# Patient Record
Sex: Male | Born: 1955 | Race: White | Hispanic: No | Marital: Married | Smoking: Never smoker
Health system: Southern US, Community
[De-identification: ages and names within clinical notes are randomized; demographics above are authoritative.]

## PROBLEM LIST (undated history)

## (undated) DIAGNOSIS — G20A1 Dementia in other diseases classified elsewhere, unspecified severity, without behavioral disturbance, psychotic disturbance, mood disturbance, and anxiety: Secondary | ICD-10-CM

## (undated) DIAGNOSIS — N401 Enlarged prostate with lower urinary tract symptoms: Secondary | ICD-10-CM

## (undated) DIAGNOSIS — Z978 Presence of other specified devices: Secondary | ICD-10-CM

## (undated) DIAGNOSIS — M359 Systemic involvement of connective tissue, unspecified: Secondary | ICD-10-CM

## (undated) DIAGNOSIS — Z960 Presence of urogenital implants: Secondary | ICD-10-CM

## (undated) DIAGNOSIS — R338 Other retention of urine: Secondary | ICD-10-CM

## (undated) DIAGNOSIS — G20 Parkinson's disease: Secondary | ICD-10-CM

## (undated) DIAGNOSIS — F0280 Dementia in other diseases classified elsewhere without behavioral disturbance: Secondary | ICD-10-CM

## (undated) DIAGNOSIS — N390 Urinary tract infection, site not specified: Secondary | ICD-10-CM

---

## 2000-04-18 ENCOUNTER — Encounter (INDEPENDENT_AMBULATORY_CARE_PROVIDER_SITE_OTHER): Payer: Self-pay | Admitting: Specialist

## 2000-04-18 ENCOUNTER — Other Ambulatory Visit: Admission: RE | Admit: 2000-04-18 | Discharge: 2000-04-18 | Payer: Self-pay | Admitting: Urology

## 2000-04-25 ENCOUNTER — Encounter: Payer: Self-pay | Admitting: Neurosurgery

## 2000-04-25 ENCOUNTER — Ambulatory Visit (HOSPITAL_COMMUNITY): Admission: RE | Admit: 2000-04-25 | Discharge: 2000-04-25 | Payer: Self-pay | Admitting: Neurosurgery

## 2000-05-09 ENCOUNTER — Encounter: Payer: Self-pay | Admitting: Neurosurgery

## 2000-05-09 ENCOUNTER — Ambulatory Visit (HOSPITAL_COMMUNITY): Admission: RE | Admit: 2000-05-09 | Discharge: 2000-05-09 | Payer: Self-pay | Admitting: Neurosurgery

## 2000-05-23 ENCOUNTER — Ambulatory Visit (HOSPITAL_COMMUNITY): Admission: RE | Admit: 2000-05-23 | Discharge: 2000-05-23 | Payer: Self-pay | Admitting: Neurosurgery

## 2000-05-23 ENCOUNTER — Encounter: Payer: Self-pay | Admitting: Neurosurgery

## 2007-04-19 ENCOUNTER — Emergency Department (HOSPITAL_COMMUNITY): Admission: EM | Admit: 2007-04-19 | Discharge: 2007-04-19 | Payer: Self-pay | Admitting: Emergency Medicine

## 2010-01-17 ENCOUNTER — Emergency Department (HOSPITAL_BASED_OUTPATIENT_CLINIC_OR_DEPARTMENT_OTHER): Admission: EM | Admit: 2010-01-17 | Discharge: 2010-01-17 | Payer: Self-pay | Admitting: Emergency Medicine

## 2011-09-18 ENCOUNTER — Encounter (HOSPITAL_BASED_OUTPATIENT_CLINIC_OR_DEPARTMENT_OTHER): Payer: Self-pay | Admitting: *Deleted

## 2011-09-18 ENCOUNTER — Emergency Department (INDEPENDENT_AMBULATORY_CARE_PROVIDER_SITE_OTHER): Payer: BC Managed Care – PPO

## 2011-09-18 ENCOUNTER — Emergency Department (HOSPITAL_BASED_OUTPATIENT_CLINIC_OR_DEPARTMENT_OTHER)
Admission: EM | Admit: 2011-09-18 | Discharge: 2011-09-18 | Disposition: A | Discharge status: Home / Self Care | Payer: BC Managed Care – PPO | Attending: Emergency Medicine | Admitting: Emergency Medicine

## 2011-09-18 DIAGNOSIS — N400 Enlarged prostate without lower urinary tract symptoms: Secondary | ICD-10-CM | POA: Insufficient documentation

## 2011-09-18 DIAGNOSIS — N323 Diverticulum of bladder: Secondary | ICD-10-CM

## 2011-09-18 DIAGNOSIS — R109 Unspecified abdominal pain: Secondary | ICD-10-CM

## 2011-09-18 LAB — URINALYSIS, ROUTINE W REFLEX MICROSCOPIC
Bilirubin Urine: NEGATIVE
Glucose, UA: NEGATIVE mg/dL
Hgb urine dipstick: NEGATIVE
Ketones, ur: NEGATIVE mg/dL
Leukocytes, UA: NEGATIVE
Nitrite: NEGATIVE
Protein, ur: NEGATIVE mg/dL
Specific Gravity, Urine: 1.014 (ref 1.005–1.030)
Urobilinogen, UA: 0.2 mg/dL (ref 0.0–1.0)
pH: 6 (ref 5.0–8.0)

## 2011-09-18 MED ORDER — HYDROCODONE-ACETAMINOPHEN 5-325 MG PO TABS: 1.0000 | ORAL_TABLET | ORAL | Status: AC | PRN

## 2011-09-18 NOTE — ED Provider Notes (Signed)
History     CSN: [id number]  Arrival date & time 09/18/11  1925   First MD Initiated Contact with Patient 09/18/11 2207      Chief Complaint  Patient presents with  . Flank Pain    (Consider location/radiation/quality/duration/timing/severity/associated sxs/prior treatment) Patient is a 56 y.o. male presenting with flank pain. The history is provided by the patient and medical records. No language interpreter was used.  Flank Pain This is a new problem. The current episode started 6 to 12 hours ago. Episode frequency: Intermittent episodes of left flank pain, starting this morning. The problem has not changed since onset.Associated symptoms comments: No vomiting or diarrhea.  No dysuria or hematuria.  No history of injury.. The symptoms are aggravated by nothing. The symptoms are relieved by nothing. He has tried nothing for the symptoms.    Past Medical History  Diagnosis Date  . Enlarged prostate     History reviewed. No pertinent past surgical history.  No family history on file.  History  Substance Use Topics  . Smoking status: Never Smoker   . Smokeless tobacco: Not on file  . Alcohol Use: Yes      Review of Systems  Constitutional: Negative.  Negative for fever and chills.  HENT: Negative.   Eyes: Negative.   Respiratory: Negative.   Cardiovascular: Negative.   Gastrointestinal: Negative for nausea, vomiting and diarrhea.  Genitourinary: Positive for flank pain. Negative for dysuria, frequency, hematuria and difficulty urinating.  Musculoskeletal: Negative.   Skin: Negative.   Neurological: Negative.   Psychiatric/Behavioral: Negative.     Allergies  Levaquin  Home Medications   Current Outpatient Rx  Name Route Sig Dispense Refill  . TAMSULOSIN HCL 0.4 MG PO CAPS Oral Take 0.4 mg by mouth daily.    Marland Kitchen HYDROCODONE-ACETAMINOPHEN 5-325 MG PO TABS Oral Take 1 tablet by mouth every 4 (four) hours as needed for pain. 20 tablet 0    BP 153/99  Pulse 74   Temp(Src) 98 F (36.7 C) (Oral)  Resp 18  SpO2 98%  Physical Exam  Constitutional: He is oriented to person, place, and time. He appears well-developed and well-nourished. No distress.  HENT:  Head: Normocephalic and atraumatic.  Right Ear: External ear normal.  Left Ear: External ear normal.  Mouth/Throat: Oropharynx is clear and moist.  Eyes: Conjunctivae and EOM are normal. Pupils are equal, round, and reactive to light.  Neck: Normal range of motion. Neck supple.  Cardiovascular: Normal rate, regular rhythm and normal heart sounds.   Pulmonary/Chest: Effort normal and breath sounds normal.  Abdominal: Soft. Bowel sounds are normal.  Musculoskeletal:       Pain is localized to the left CVA region.  There is no bony deformity or point tenderness.  Neurological: He is alert and oriented to person, place, and time.       No sensory or motor deficit.  Skin: Skin is warm and dry. No rash noted. No erythema.  Psychiatric: He has a normal mood and affect. His behavior is normal.    ED Course  Procedures (including critical care time)   Labs Reviewed  URINALYSIS, ROUTINE W REFLEX MICROSCOPIC   Ct Abdomen Pelvis Wo Contrast  09/18/2011  *RADIOLOGY REPORT*  Clinical Data: Left flank pain; history of renal stone.  CT ABDOMEN AND PELVIS WITHOUT CONTRAST  Technique:  Multidetector CT imaging of the abdomen and pelvis was performed following the standard protocol without intravenous contrast.  Comparison: None.  Findings: The visualized lung bases are clear.  The liver and spleen are unremarkable in appearance.  The gallbladder is within normal limits.  The pancreas and adrenal glands are unremarkable.  A nonspecific focal calcification is noted anterior to the right hepatic lobe; this could reflect a calcified epiploic appendage.  A 1.8 cm left parapelvic cyst is suspected.  There is no evidence of hydronephrosis.  No renal or ureteral stones are identified.  No significant perinephric  stranding is appreciated.  No free fluid is identified.  There is mild diffuse fecalization of the distal ileum, raising question for mild small bowel dysmotility.  The small bowel is otherwise unremarkable in appearance.  The stomach is within normal limits.  No acute vascular abnormalities are seen.  Minimal calcification is noted along the distal abdominal aorta and its branches.  The appendix is normal in caliber, without evidence for appendicitis.  The colon is grossly unremarkable in appearance.  The bladder is the bladder is significantly distended.  There is a prominent 4.5 cm diverticulum arising from the posterior aspect of the bladder, with significant wall thickening at the neck of the diverticulum; this is nonspecific and may reflect chronic inflammation, though malignancy cannot be entirely excluded.  The prostate is borderline normal in size; there is asymmetric prominence of the right seminal vesicle.  No definite pelvic sidewall lymphadenopathy is seen.  No inguinal lymphadenopathy is seen.  No acute osseous abnormalities are identified.  Multilevel disc space narrowing and vacuum phenomenon and along the lower thoracic and lumbar spine.  IMPRESSION:  1.  No evidence of hydronephrosis; no obstructing ureteral stones seen. 2.  Prominent 4.5 cm diverticulum arising from the posterior aspect of the bladder, with significant wall thickening at the neck of the diverticula.  This is nonspecific and may reflect chronic inflammation, though malignancy cannot be entirely excluded. Suggest further evaluation as deemed clinically appropriate. 3.  Asymmetric prominence of the right seminal vesicle; the prostate remains borderline normal in size. 4.  Mild diffuse fecalization of the distal ileum raises question for mild small bowel dysmotility. 5.  Likely 1.2 cm left renal parapelvic cyst incidentally noted.  Original Report Authenticated By: Tonia Ghent, M.D.     1. Left flank pain   2. Bladder  diverticulum    CT shows relatively large bladder diverticulum, prostate borderline enlarged.  No real explanation of his pain.  Rx hydrocodone-acetaminophen for pain, referral to his urologist, Barron Alvine M.D.    Carleene Cooper III, MD 09/19/11 [phone]

## 2011-09-18 NOTE — ED Notes (Signed)
Pt c/o left flank pain that began this morning. Pt denies dysuria.

## 2012-05-18 DIAGNOSIS — G20 Parkinson's disease: Secondary | ICD-10-CM | POA: Insufficient documentation

## 2012-11-03 ENCOUNTER — Other Ambulatory Visit: Payer: Self-pay | Admitting: Urology

## 2012-11-16 ENCOUNTER — Encounter (HOSPITAL_BASED_OUTPATIENT_CLINIC_OR_DEPARTMENT_OTHER): Payer: Self-pay | Admitting: *Deleted

## 2012-11-16 NOTE — Progress Notes (Addendum)
NPO AFTER MN. ARRIVES AT 0845. NEEDS HG. WILL  REVIEW CHART W/ MDA TOMORROW 11-17-2012, CONCERNING MAO INHIBITOR PT IS ON, AND CALL PT BACK IF NEEDS TO STOP PRIOR TO SURGERY.  REVIEWED CHART W/ DR DENNENY MDA,  ABOUT PT STOPPING MAO INHIBITOR, HE STATES PT DID NOT NEED TO STOP THIS MED.  SPOKE W/ PT AND TOLD HIM HE DID NOT NEED TO STOP HIS MED. AND POSS. OWER. REVIEWED [facility] GUIDELINES, PT WILL BRING MEDS.

## 2012-11-20 NOTE — H&P (Signed)
Patient presents today for elective TURP. He has history of a recent office visit as summarized below:    History of Present Illness   Adam Cordova returns today for a followup visit. He was last seen about a month ago. The note from that time will outline his history. He has been seen for some fairly longstanding benign prostatic hyperplasia and bladder neck obstructive symptoms complicated by a diagnosis of Parkinson's disease. The patient has also had a mildly elevated PSA in the past. At his last visit, he was noted to have a markedly elevated postvoid residual of about 700 mL. Foley catheter was inserted, which confirmed a very large postvoid residual. Renal function studies were normal. We thought it was very important for him to have urodynamic studies to try to determine how much of this might be related to some bladder dysfunction due to Parkinson's disease versus outlet obstruction . At the time of urodynamics, the patient's bladder was noted to be somewhat hyposensitive. He did have some very minimal instability, but not until over 450 cc was instilled in his bladder and then instability was relatively mild with very low amplitude contractions. The patient was able to generate a good, well-sustained detrusor contraction, but was unable to void. His bladder base was elevated. Urodynamic findings were certainly much more consistent with obstruction than they were detrusor dysfunction. The patient presents now for a discussion about where we go from here. He is interested in trying to get the Foley catheter out if at all possible, but again, we have instituted no treatment other than the Foley catheter for the past 3-4 weeks. He remains on Flomax.      Past Medical History Problems  1. History of  Hypercholesterolemia 272.0 2. History of  Parkinson's Disease 332.0  Current Meds 1. Cialis 5 MG Oral Tablet; Take 1 tablet daily; Therapy: 19Aug2013 to (Evaluate:17Mar2014)   Requested for: 19Aug2013;  Last Rx:19Aug2013 2. EpiPen 2-Pak 0.3 MG/0.3ML Injection Device; AS NEEDED; Therapy: 26Aug2010 to  (Evaluate:25Sep2010) 3. Pramipexole Dihydrochloride 0.5 MG Oral Tablet; Therapy: 12Apr2013 to 4. Selegiline HCl 5 MG Oral Tablet; Therapy: 22Oct2013 to  Allergies Medication  1. Levaquin TABS Non-Medication  2. Bee sting  Family History Problems  1. Family history of  Family Health Status Number Of Children 1 son; 1 daughter  Social History Problems  1. Alcohol Use 1-2 2. Caffeine Use 3-4 3. Family history of  Death In The Family Father 59, stroke 4. Family history of  Death In The Family Mother 58 5. Never A Smoker  Review of Systems Genitourinary, constitutional, skin, eye, otolaryngeal, hematologic/lymphatic, cardiovascular, pulmonary, endocrine, musculoskeletal, gastrointestinal, neurological and psychiatric system(s) were reviewed and pertinent findings if present are noted.  Genitourinary: urinary frequency and erectile dysfunction, but no hematuria.  Gastrointestinal: no abdominal pain.  Musculoskeletal: back pain.    Vitals Vital Signs [Data Includes: Last 1 Day]  04Mar2014 10:09AM  Blood Pressure: 147 / 94 Temperature: 98.1 F Heart Rate: 76  Physical Exam Well-developed well-nourished male in no acute distress Respiratory: Normal effort Cardiac: Regular rate and rhythm Abdomen: Soft and nontender no palpable masses Extremity: No edema tenderness Genitourinary: Examination of the penis demonstrates an indwelling catheter, but no discharge, no masses, no lesions and a normal meatus. The scrotum is without lesions. The right epididymis is palpably normal and non-tender. The left epididymis is palpably normal and non-tender. The right testis is non-tender and without masses. The left testis is non-tender and without masses.    Procedure  Procedure:  Cystoscopy  Chaperone Present: brandy.  Indication: Lower Urinary Tract Symptoms.  Informed Consent: Risks,  benefits, and potential adverse events were discussed and informed consent was obtained. Specific risks including, but not limited to bleeding, infection, pain, allergic reaction etc. were explained.  Prep: The patient was prepped with betadine.  Anesthesia:. Local anesthesia was administered intraurethrally with 2% lidocaine jelly.  Antibiotic prophylaxis: Trimethoprim/Sulfamethoxazole.  Procedure Note:  Urethral meatus:. No abnormalities.  Anterior urethra: No abnormalities.  Prostatic urethra:. Estimated length was 4.5 cm. There was visual obstruction of the prostatic urethra.  Bladder: Visulization was clear. Examination of the bladder demonstrated trabeculation and a diverticulum located near the trigone of the bladder measuring approximately 6-8 cm erythematous mucosa. The patient tolerated the procedure well.  Complications: None.    Assessment Assessed  1. 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 Benign Prostatic Hypertrophy With Urinary Obstruction 600.01 3. 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 Sulfamethoxazole-TMP DS 800-160 MG Oral Tablet; TAKE 1 TABLET BID; Therapy: 04Mar2014 to  (Last Rx:04Mar2014) 2. Cysto  Done: 04Mar2014 3. Follow-up Schedule Surgery Office  Follow-up  Requested for: 04Mar2014  Discussion/Summary  A total of 35 minutes were spent in the overall care of the patient today with 25 minutes in direct face to face consultation.   Again, Adam Cordova situation recently has been complicated. He has really had some longstanding voiding issues, but we were both quite surprised when he came in a month ago to find such a large postvoid residual. I suspect he has had some longstanding bladder neck obstruction and benign prostatic hyperplasia, along with potentially some dysfunctional voiding/mild neurogenic bladder. Urodynamics revealed a fairly strong,  well-sustained detrusor contraction with inability to void, again suggesting that obstruction is the most likely culprit. Visually on cystoscopy today, he does have a high-riding median bar with trilobar hyperplasia. Obviously, we have to be quite careful with his Parkinson's disease and it is possible that some of his voiding dysfunction is due to intermittent reduced bladder contractility and/or bradykinesia of his urinary sphincter. I do think, however, given his current urodynamic findings and the fact that he is 57 years of age, that a limited TURP is certainly indicated in this setting. I would concentrate primarily on taking down the middle lobe/median bar component. We would try to avoid going out distally much on his prostate and also try to really limit lateral lobe resection. He wants to leave his Foley catheter out today. After cystoscopy, he was only able to void about 30 cc after instilling 450. I think he is at high risk for developing urinary retention or overflow incontinence, but he is going to give it a try today. We will empirically cover him with a 5-day course of Septra. If he is unable to void effectively, he will come back here for reinsertion of the catheter. Otherwise, we are tentatively looking at getting him on the schedule for a TURP sometime in the next couple of weeks. Again, that procedure was discussed with him, as well as the risk benefits and what we might expect going forward.

## 2012-11-23 ENCOUNTER — Ambulatory Visit (HOSPITAL_BASED_OUTPATIENT_CLINIC_OR_DEPARTMENT_OTHER): Payer: BC Managed Care – PPO | Admitting: Anesthesiology

## 2012-11-23 ENCOUNTER — Encounter (HOSPITAL_BASED_OUTPATIENT_CLINIC_OR_DEPARTMENT_OTHER)
Admission: RE | Disposition: A | Discharge status: Home / Self Care | Payer: Self-pay | Source: Ambulatory Visit | Attending: Urology

## 2012-11-23 ENCOUNTER — Encounter (HOSPITAL_BASED_OUTPATIENT_CLINIC_OR_DEPARTMENT_OTHER): Payer: Self-pay

## 2012-11-23 ENCOUNTER — Ambulatory Visit (HOSPITAL_BASED_OUTPATIENT_CLINIC_OR_DEPARTMENT_OTHER)
Admission: RE | Admit: 2012-11-23 | Discharge: 2012-11-23 | Disposition: A | Discharge status: Home / Self Care | Payer: BC Managed Care – PPO | Source: Ambulatory Visit | Attending: Urology | Admitting: Urology

## 2012-11-23 ENCOUNTER — Encounter (HOSPITAL_BASED_OUTPATIENT_CLINIC_OR_DEPARTMENT_OTHER): Payer: Self-pay | Admitting: Anesthesiology

## 2012-11-23 DIAGNOSIS — N400 Enlarged prostate without lower urinary tract symptoms: Secondary | ICD-10-CM

## 2012-11-23 DIAGNOSIS — N411 Chronic prostatitis: Secondary | ICD-10-CM | POA: Insufficient documentation

## 2012-11-23 DIAGNOSIS — Z79899 Other long term (current) drug therapy: Secondary | ICD-10-CM | POA: Insufficient documentation

## 2012-11-23 DIAGNOSIS — N138 Other obstructive and reflux uropathy: Secondary | ICD-10-CM | POA: Insufficient documentation

## 2012-11-23 DIAGNOSIS — G20 Parkinson's disease: Secondary | ICD-10-CM | POA: Insufficient documentation

## 2012-11-23 DIAGNOSIS — E7800 Pure hypercholesterolemia, unspecified: Secondary | ICD-10-CM | POA: Insufficient documentation

## 2012-11-23 DIAGNOSIS — Z91038 Other insect allergy status: Secondary | ICD-10-CM | POA: Insufficient documentation

## 2012-11-23 DIAGNOSIS — R338 Other retention of urine: Secondary | ICD-10-CM

## 2012-11-23 DIAGNOSIS — G20A1 Parkinson's disease without dyskinesia, without mention of fluctuations: Secondary | ICD-10-CM | POA: Insufficient documentation

## 2012-11-23 DIAGNOSIS — N401 Enlarged prostate with lower urinary tract symptoms: Secondary | ICD-10-CM | POA: Insufficient documentation

## 2012-11-23 DIAGNOSIS — N4289 Other specified disorders of prostate: Secondary | ICD-10-CM | POA: Insufficient documentation

## 2012-11-23 DIAGNOSIS — N529 Male erectile dysfunction, unspecified: Secondary | ICD-10-CM | POA: Insufficient documentation

## 2012-11-23 DIAGNOSIS — R339 Retention of urine, unspecified: Secondary | ICD-10-CM | POA: Insufficient documentation

## 2012-11-23 DIAGNOSIS — Z881 Allergy status to other antibiotic agents status: Secondary | ICD-10-CM | POA: Insufficient documentation

## 2012-11-23 HISTORY — DX: Other retention of urine: R33.8

## 2012-11-23 HISTORY — DX: Benign prostatic hyperplasia with lower urinary tract symptoms: N40.1

## 2012-11-23 HISTORY — DX: Parkinson's disease: G20

## 2012-11-23 HISTORY — PX: TRANSURETHRAL RESECTION OF PROSTATE: SHX73

## 2012-11-23 HISTORY — DX: Urinary tract infection, site not specified: N39.0

## 2012-11-23 HISTORY — DX: Parkinson's disease without dyskinesia, without mention of fluctuations: G20.A1

## 2012-11-23 HISTORY — DX: Presence of urogenital implants: Z96.0

## 2012-11-23 HISTORY — DX: Presence of other specified devices: Z97.8

## 2012-11-23 SURGERY — TRANSURETHRAL RESECTION OF THE PROSTATE WITH GYRUS INSTRUMENTS
Anesthesia: General | Site: Prostate | Wound class: Clean Contaminated

## 2012-11-23 MED ORDER — LACTATED RINGERS IV SOLN
INTRAVENOUS | Status: DC
  Filled 2012-11-23: qty 1000

## 2012-11-23 MED ORDER — PROPOFOL 10 MG/ML IV BOLUS
INTRAVENOUS | Status: DC | PRN
  Administered 2012-11-23: 200 mg via INTRAVENOUS
  Administered 2012-11-23: 100 mg via INTRAVENOUS

## 2012-11-23 MED ORDER — DEXTROSE 5 % IV SOLN
2.0000 g | INTRAVENOUS | Status: DC | PRN
  Administered 2012-11-23: 2 g via INTRAVENOUS

## 2012-11-23 MED ORDER — LACTATED RINGERS IV SOLN
INTRAVENOUS | Status: DC
  Administered 2012-11-23: 10:00:00 via INTRAVENOUS
  Filled 2012-11-23: qty 1000

## 2012-11-23 MED ORDER — MIDAZOLAM HCL 5 MG/5ML IJ SOLN
INTRAMUSCULAR | Status: DC | PRN
  Administered 2012-11-23: 2 mg via INTRAVENOUS

## 2012-11-23 MED ORDER — GLYCOPYRROLATE 0.2 MG/ML IJ SOLN
INTRAMUSCULAR | Status: DC | PRN
  Administered 2012-11-23: 0.2 mg via INTRAVENOUS

## 2012-11-23 MED ORDER — FENTANYL CITRATE 0.05 MG/ML IJ SOLN
INTRAMUSCULAR | Status: DC | PRN
  Administered 2012-11-23 (×3): 50 ug via INTRAVENOUS

## 2012-11-23 MED ORDER — HYDROCODONE-ACETAMINOPHEN 5-325 MG PO TABS: 1.0000 | ORAL_TABLET | Freq: Four times a day (QID) | ORAL | Status: DC | PRN

## 2012-11-23 MED ORDER — LACTATED RINGERS IV SOLN
INTRAVENOUS | Status: DC | PRN
  Administered 2012-11-23 (×2): via INTRAVENOUS

## 2012-11-23 MED ORDER — PROMETHAZINE HCL 25 MG/ML IJ SOLN
6.2500 mg | INTRAMUSCULAR | Status: DC | PRN
  Filled 2012-11-23: qty 1

## 2012-11-23 MED ORDER — FENTANYL CITRATE 0.05 MG/ML IJ SOLN
25.0000 ug | INTRAMUSCULAR | Status: DC | PRN
  Filled 2012-11-23: qty 1

## 2012-11-23 MED ORDER — LIDOCAINE HCL (CARDIAC) 20 MG/ML IV SOLN
INTRAVENOUS | Status: DC | PRN
  Administered 2012-11-23: 60 mg via INTRAVENOUS

## 2012-11-23 MED ORDER — SODIUM CHLORIDE 0.9 % IR SOLN
Status: DC | PRN
  Administered 2012-11-23: 6000 mL

## 2012-11-23 MED ORDER — DEXTROSE 5 % IV SOLN
1.0000 g | Freq: Once | INTRAVENOUS | Status: DC
  Filled 2012-11-23: qty 10

## 2012-11-23 MED ORDER — CIPROFLOXACIN HCL 500 MG PO TABS: 500.0000 mg | ORAL_TABLET | Freq: Two times a day (BID) | ORAL | Status: DC

## 2012-11-23 MED ORDER — EPHEDRINE SULFATE 50 MG/ML IJ SOLN
INTRAMUSCULAR | Status: DC | PRN
  Administered 2012-11-23 (×2): 10 mg via INTRAVENOUS

## 2012-11-23 MED ORDER — ONDANSETRON HCL 4 MG/2ML IJ SOLN
INTRAMUSCULAR | Status: DC | PRN
  Administered 2012-11-23: 4 mg via INTRAVENOUS

## 2012-11-23 MED ORDER — LIDOCAINE HCL 2 % EX GEL
CUTANEOUS | Status: DC | PRN
  Administered 2012-11-23: 1

## 2012-11-23 SURGICAL SUPPLY — 32 items
BAG DRAIN URO-CYSTO SKYTR STRL (DRAIN) ×2 IMPLANT
BAG DRN ANRFLXCHMBR STRAP LEK (BAG) ×1
BAG DRN UROCATH (DRAIN) ×1
BAG URINE DRAINAGE (UROLOGICAL SUPPLIES) IMPLANT
BAG URINE LEG 19OZ MD ST LTX (BAG) ×1 IMPLANT
CANISTER SUCT LVC 12 LTR MEDI- (MISCELLANEOUS) ×4 IMPLANT
CATH FOLEY 2WAY SLVR  5CC 20FR (CATHETERS)
CATH FOLEY 2WAY SLVR  5CC 22FR (CATHETERS)
CATH FOLEY 2WAY SLVR 30CC 20FR (CATHETERS) ×1 IMPLANT
CATH FOLEY 2WAY SLVR 5CC 20FR (CATHETERS) IMPLANT
CATH FOLEY 2WAY SLVR 5CC 22FR (CATHETERS) IMPLANT
CATH FOLEY 3WAY 30CC 22F (CATHETERS) IMPLANT
CLOTH BEACON ORANGE TIMEOUT ST (SAFETY) ×2 IMPLANT
DRAPE CAMERA CLOSED 9X96 (DRAPES) ×2 IMPLANT
ELECT BUTTON HF 24-28F 2 30DE (ELECTRODE) ×2 IMPLANT
ELECT LOOP MED HF 24F 12D CBL (CLIP) ×1 IMPLANT
ELECT REM PT RETURN 9FT ADLT (ELECTROSURGICAL) ×2
ELECT RESECT VAPORIZE 12D CBL (ELECTRODE) IMPLANT
ELECTRODE REM PT RTRN 9FT ADLT (ELECTROSURGICAL) ×1 IMPLANT
EVACUATOR MICROVAS BLADDER (UROLOGICAL SUPPLIES) IMPLANT
GLOVE BIO SURGEON STRL SZ7.5 (GLOVE) ×2 IMPLANT
GLOVE BIOGEL PI IND STRL 7.0 (GLOVE) IMPLANT
GLOVE BIOGEL PI INDICATOR 7.0 (GLOVE) ×1
GLOVE ECLIPSE 7.5 STRL STRAW (GLOVE) ×1 IMPLANT
GOWN STRL REIN XL XLG (GOWN DISPOSABLE) ×4 IMPLANT
HOLDER FOLEY CATH W/STRAP (MISCELLANEOUS) IMPLANT
IV NS IRRIG 3000ML ARTHROMATIC (IV SOLUTION) ×4 IMPLANT
KIT ASPIRATION TUBING (SET/KITS/TRAYS/PACK) IMPLANT
PACK CYSTOSCOPY (CUSTOM PROCEDURE TRAY) ×2 IMPLANT
PLUG CATH AND CAP STER (CATHETERS) IMPLANT
SET ASPIRATION TUBING (TUBING) ×1 IMPLANT
SYRINGE IRR TOOMEY STRL 70CC (SYRINGE) IMPLANT

## 2012-11-23 NOTE — Anesthesia Preprocedure Evaluation (Addendum)
Anesthesia Evaluation  Patient identified by MRN, date of birth, ID band Patient awake    Reviewed: Allergy & Precautions, H&P , NPO status , Patient's Chart, lab work & pertinent test results  Airway Mallampati: II TM Distance: >3 FB Neck ROM: Full    Dental  (+) Teeth Intact and Dental Advisory Given   Pulmonary  breath sounds clear to auscultation  Pulmonary exam normal       Cardiovascular negative cardio ROS  Rhythm:Irregular Rate:Normal     Neuro/Psych Parkinsons Disease negative neurological ROS  negative psych ROS   GI/Hepatic negative GI ROS, Neg liver ROS,   Endo/Other  negative endocrine ROS  Renal/GU negative Renal ROS   BPH    Musculoskeletal negative musculoskeletal ROS (+)   Abdominal   Peds  Hematology negative hematology ROS (+)   Anesthesia Other Findings   Reproductive/Obstetrics negative OB ROS                          Anesthesia Physical Anesthesia Plan  ASA: II  Anesthesia Plan: General   Post-op Pain Management:    Induction: Intravenous  Airway Management Planned: LMA  Additional Equipment:   Intra-op Plan:   Post-operative Plan: Extubation in OR  Informed Consent: I have reviewed the patients History and Physical, chart, labs and discussed the procedure including the risks, benefits and alternatives for the proposed anesthesia with the patient or authorized representative who has indicated his/her understanding and acceptance.   Dental advisory given  Plan Discussed with: CRNA  Anesthesia Plan Comments:         Anesthesia Quick Evaluation

## 2012-11-23 NOTE — Anesthesia Procedure Notes (Signed)
Procedure Name: LMA Insertion Date/Time: 11/23/2012 10:28 AM Performed by: Jessica Priest Pre-anesthesia Checklist: Patient identified, Emergency Drugs available, Suction available and Patient being monitored Patient Re-evaluated:Patient Re-evaluated prior to inductionOxygen Delivery Method: Circle System Utilized Preoxygenation: Pre-oxygenation with 100% oxygen Intubation Type: IV induction Ventilation: Mask ventilation without difficulty LMA: LMA inserted LMA Size: 4.0 Number of attempts: 1 Airway Equipment and Method: bite block Placement Confirmation: positive ETCO2 Tube secured with: Tape Dental Injury: Teeth and Oropharynx as per pre-operative assessment

## 2012-11-23 NOTE — Op Note (Signed)
Preoperative diagnosis: Acute urinary retention/BPH Postoperative diagnosis: Same  Procedure: TURP   Surgeon: Valetta Fuller M.D.  Anesthesia: Gen.  Indications: Mr. Cheatwood is 63 years of at with long-standing progressive voiding symptoms. He was recently noted to have a markedly elevated post void residual of 700 cc. Foley catheter was inserted. The patient failed a number of voiding trials despite maximal medical therapy. Video urodynamics revealed a high pressure bladder with essentially no flow consistent with obstruction. The patient does have a history of Parkinson's disease. We went over in great detail issues with regard to TURP and Parkinson's disease with the increased risk of incontinence. We felt that given his entire situation that he would potentially benefit from a limited conservative TURP of his middle lobe/median bar. He elected to proceed with the procedure having understood the risks and benefits and full informed consent has been obtained. The patient is received perioperative antibiotics as well as PAS compression boots.     Technique and findings: This was marked the operating room risks also motion general anesthesia. He is placed in lithotomy position prepped and draped in usual manner. Appropriate surgical timeout performed. A 20 French continuous-flow reset scope was utilized. We ended up using gyrus instrumentation with saline irrigation. Again the patient had trilobar hyperplasia with a small middle lobe and very high riding and prominent median bar. The bladder showed some trabecular change with the diverticulum. No other pathology was appreciated. We resected the middle lobe and median bar down to capsular fibers. This resection was then advanced up to the vera montanum but not beyond that. We did not resect any significant lateral lobe tissue. Patient's hemostasis then the procedure was quite tablet light pink urine. A 20 French Foley catheter was inserted which drained well.  No obvious complications or problems occurred.

## 2012-11-23 NOTE — Transfer of Care (Signed)
Immediate Anesthesia Transfer of Care Note  Patient: Adam Cordova  Procedure(s) Performed: Procedure(s) (LRB): TRANSURETHRAL RESECTION OF THE PROSTATE WITH GYRUS INSTRUMENTS (N/A)  Patient Location: PACU  Anesthesia Type: General  Level of Consciousness: awake, sedated, patient cooperative and responds to stimulation  Airway & Oxygen Therapy: Patient Spontanous Breathing and Patient connected to face mask oxygen  Post-op Assessment: Report given to PACU RN, Post -op Vital signs reviewed and stable and Patient moving all extremities  Post vital signs: Reviewed and stable  Complications: No apparent anesthesia complications

## 2012-11-23 NOTE — Interval H&P Note (Signed)
History and Physical Interval Note:  11/23/2012 9:12 AM  Adam Cordova  has presented today for surgery, with the diagnosis of BENIGN PROSTATIC HYPERTROPHY, URINARY RETENTION  The various methods of treatment have been discussed with the patient and family. After consideration of risks, benefits and other options for treatment, the patient has consented to  Procedure(s): TRANSURETHRAL RESECTION OF THE PROSTATE WITH GYRUS INSTRUMENTS (N/A) as a surgical intervention .  [name] history has been reviewed, patient examined, no change in status, stable for surgery.  I have reviewed the patient's chart and labs.  Questions were answered to the patient's satisfaction.     Azul Coffie S

## 2012-11-25 ENCOUNTER — Encounter (HOSPITAL_BASED_OUTPATIENT_CLINIC_OR_DEPARTMENT_OTHER): Payer: Self-pay | Admitting: Urology

## 2012-12-09 NOTE — Anesthesia Postprocedure Evaluation (Signed)
Anesthesia Post Note  Patient: Adam Cordova  Procedure(s) Performed: Procedure(s) (LRB): TRANSURETHRAL RESECTION OF THE PROSTATE WITH GYRUS INSTRUMENTS (N/A)  Anesthesia type: General  Patient location: PACU  Post pain: Pain level controlled  Post assessment: Post-op Vital signs reviewed  Last Vitals:  Filed Vitals:   11/23/12 1318  BP: 138/84  Pulse: 77  Temp: 36.1 C  Resp: 16    Post vital signs: Reviewed  Level of consciousness: sedated  Complications: No apparent anesthesia complications

## 2013-05-27 ENCOUNTER — Ambulatory Visit (INDEPENDENT_AMBULATORY_CARE_PROVIDER_SITE_OTHER): Payer: BC Managed Care – PPO | Admitting: Surgery

## 2013-05-27 ENCOUNTER — Encounter (INDEPENDENT_AMBULATORY_CARE_PROVIDER_SITE_OTHER): Payer: Self-pay | Admitting: Surgery

## 2013-05-27 VITALS — BP 134/88 | HR 76 | Resp 16 | Ht 70.0 in | Wt 172.0 lb

## 2013-05-27 DIAGNOSIS — K429 Umbilical hernia without obstruction or gangrene: Secondary | ICD-10-CM

## 2013-05-27 NOTE — Progress Notes (Signed)
Patient ID: Adam Cordova, male   DOB: 31-Aug-1956, 57 y.o.   MRN: [id number]  Chief Complaint  Patient presents with  . New Evaluation    eval umb hernia    HPI ELL TISO is a 57 y.o. male.  Patient sent at request of Dr. Blair Heys for bulge at umbilicus. Been present for a number of months. It is getting larger. It slides in and out.  Mild discomfort. No nausea or vomiting.  HPI  Past Medical History  Diagnosis Date  . BPH (benign prostatic hypertrophy) with urinary retention   . Parkinson disease     neurologist--  dr Maisie Fus hickey  in [place]  . Foley catheter in place   . UTI (urinary tract infection)     Past Surgical History  Procedure Laterality Date  . Transurethral resection of prostate N/A 11/23/2012    Procedure: TRANSURETHRAL RESECTION OF THE PROSTATE WITH GYRUS INSTRUMENTS;  Surgeon: Valetta Fuller, MD;  Location: [facility];  Service: Urology;  Laterality: N/A;    Family History  Problem Relation Age of Onset  . Heart disease Father     Social History History  Substance Use Topics  . Smoking status: Never Smoker   . Smokeless tobacco: Never Used  . Alcohol Use: 3.0 oz/week    5 Glasses of wine per week    Allergies  Allergen Reactions  . Bee Venom Anaphylaxis  . Levaquin [Levofloxacin Hemihydrate] Anaphylaxis and Swelling    Current Outpatient Prescriptions  Medication Sig Dispense Refill  . HYDROcodone-acetaminophen (NORCO/VICODIN) 5-325 MG per tablet Take 1-2 tablets by mouth every 6 (six) hours as needed for pain.  20 tablet  0  . selegiline (ELDEPRYL) 5 MG tablet Take 5 mg by mouth 2 (two) times daily with a meal.      . Tamsulosin HCl (FLOMAX) 0.4 MG CAPS Take 0.4 mg by mouth daily.       No current facility-administered medications for this visit.    Review of Systems Review of Systems  Constitutional: Negative.   HENT: Negative.   Eyes: Negative.   Respiratory: Negative.   Cardiovascular: Negative.     Gastrointestinal: Negative.   Endocrine: Negative.   Genitourinary: Negative.   Allergic/Immunologic: Negative.   Neurological: Negative.   Hematological: Negative.   Psychiatric/Behavioral: Negative.     Blood pressure 134/88, pulse 76, resp. rate 16, height 5\' 10"  (1.778 m), weight 172 lb (78.019 kg).  Physical Exam Physical Exam  Constitutional: He is oriented to person, place, and time. He appears well-developed and well-nourished.  HENT:  Head: Normocephalic and atraumatic.  Eyes: Pupils are equal, round, and reactive to light. No scleral icterus.  Neck: Normal range of motion. Neck supple.  Cardiovascular: Normal rate and regular rhythm.   Pulmonary/Chest: Effort normal and breath sounds normal.  Abdominal: Soft. Bowel sounds are normal. A hernia is present. Hernia confirmed positive in the ventral area.    Musculoskeletal: Normal range of motion.  Neurological: He is alert and oriented to person, place, and time.  Skin: Skin is warm and dry.  Psychiatric: He has a normal mood and affect. His behavior is normal. Judgment and thought content normal.      Assessment    Umbilical hernia symptomatic    Plan    Discussed surgical repair of umbilical hernia. Risks, benefits and alternative therapies discussed. Pathophysiology and long-term expectations of nonoperative and operative options discussed. Use of mesh discussed. He wishes to proceed.The risk of hernia repair  include bleeding,  Infection,   Recurrence of the hernia,  Mesh use, chronic pain,  Organ injury,  Bowel injury,  Bladder injury,   nerve injury with numbness around the incision,  Death,  and worsening of preexisting  medical problems.  The alternatives to surgery have been discussed as well..  Long term expectations of both operative and non operative treatments have been discussed.   The patient agrees to proceed.       Rogers Ditter A. 05/27/2013, 3:10 PM

## 2013-05-27 NOTE — Patient Instructions (Signed)
Hernia A hernia occurs when an internal organ pushes out through a weak spot in the abdominal wall. Hernias most commonly occur in the groin and around the navel. Hernias often can be pushed back into place (reduced). Most hernias tend to get worse over time. Some abdominal hernias can get stuck in the opening (irreducible or incarcerated hernia) and cannot be reduced. An irreducible abdominal hernia which is tightly squeezed into the opening is at risk for impaired blood supply (strangulated hernia). A strangulated hernia is a medical emergency. Because of the risk for an irreducible or strangulated hernia, surgery may be recommended to repair a hernia. CAUSES   Heavy lifting.  Prolonged coughing.  Straining to have a bowel movement.  A cut (incision) made during an abdominal surgery. HOME CARE INSTRUCTIONS   Bed rest is not required. You may continue your normal activities.  Avoid lifting more than 10 pounds (4.5 kg) or straining.  Cough gently. If you are a smoker it is best to stop. Even the best hernia repair can break down with the continual strain of coughing. Even if you do not have your hernia repaired, a cough will continue to aggravate the problem.  Do not wear anything tight over your hernia. Do not try to keep it in with an outside bandage or truss. These can damage abdominal contents if they are trapped within the hernia sac.  Eat a normal diet.  Avoid constipation. Straining over long periods of time will increase hernia size and encourage breakdown of repairs. If you cannot do this with diet alone, stool softeners may be used. SEEK IMMEDIATE MEDICAL CARE IF:   You have a fever.  You develop increasing abdominal pain.  You feel nauseous or vomit.  Your hernia is stuck outside the abdomen, looks discolored, feels hard, or is tender.  You have any changes in your bowel habits or in the hernia that are unusual for you.  You have increased pain or swelling around the  hernia.  You cannot push the hernia back in place by applying gentle pressure while lying down. MAKE SURE YOU:   Understand these instructions.  Will watch your condition.  Will get help right away if you are not doing well or get worse. Document Released: 08/19/2005 Document Revised: 11/11/2011 Document Reviewed: 04/07/2008 Exit[employer] Patient Information 2014 [employer].  

## 2013-06-15 ENCOUNTER — Other Ambulatory Visit (INDEPENDENT_AMBULATORY_CARE_PROVIDER_SITE_OTHER): Payer: Self-pay | Admitting: *Deleted

## 2013-06-15 DIAGNOSIS — K429 Umbilical hernia without obstruction or gangrene: Secondary | ICD-10-CM

## 2013-06-15 MED ORDER — HYDROCODONE-ACETAMINOPHEN 5-325 MG PO TABS: 1.0000 | ORAL_TABLET | ORAL | Status: DC | PRN

## 2013-07-05 ENCOUNTER — Encounter (INDEPENDENT_AMBULATORY_CARE_PROVIDER_SITE_OTHER): Payer: BC Managed Care – PPO | Admitting: Surgery

## 2013-07-23 ENCOUNTER — Ambulatory Visit (INDEPENDENT_AMBULATORY_CARE_PROVIDER_SITE_OTHER): Payer: BC Managed Care – PPO | Admitting: Surgery

## 2013-07-23 ENCOUNTER — Encounter (INDEPENDENT_AMBULATORY_CARE_PROVIDER_SITE_OTHER): Payer: Self-pay | Admitting: Surgery

## 2013-07-23 VITALS — BP 130/82 | HR 72 | Temp 98.7°F | Resp 15 | Ht 70.0 in | Wt 171.0 lb

## 2013-07-23 DIAGNOSIS — Z9889 Other specified postprocedural states: Secondary | ICD-10-CM

## 2013-07-23 NOTE — Patient Instructions (Signed)
Resume full activity. Return as needed.  

## 2013-07-23 NOTE — Progress Notes (Signed)
Pt returns today after umbilical  hernia repair.  Pain is well controlled.  Bowels are functioning.  Wound is clean.  On exam:  Incision is clean /dry/intact.  Area is soft without signs of hernia recurrence.  Impression:  Status repair of hernia  Plan:  RTC PRN  Resume full activity.Marland Kitchen

## 2013-09-29 ENCOUNTER — Other Ambulatory Visit: Payer: Self-pay | Admitting: Family Medicine

## 2013-09-29 ENCOUNTER — Ambulatory Visit
Admission: RE | Admit: 2013-09-29 | Discharge: 2013-09-29 | Disposition: A | Discharge status: Home / Self Care | Payer: BC Managed Care – PPO | Source: Ambulatory Visit | Attending: Family Medicine | Admitting: Family Medicine

## 2013-09-29 DIAGNOSIS — R52 Pain, unspecified: Secondary | ICD-10-CM

## 2014-04-22 ENCOUNTER — Encounter (INDEPENDENT_AMBULATORY_CARE_PROVIDER_SITE_OTHER): Payer: Self-pay | Admitting: Surgery

## 2014-04-22 ENCOUNTER — Ambulatory Visit (INDEPENDENT_AMBULATORY_CARE_PROVIDER_SITE_OTHER): Payer: BC Managed Care – PPO | Admitting: Surgery

## 2014-04-22 VITALS — BP 124/84 | HR 77 | Temp 97.0°F | Ht 70.0 in | Wt 177.0 lb

## 2014-04-22 DIAGNOSIS — IMO0002 Reserved for concepts with insufficient information to code with codable children: Secondary | ICD-10-CM

## 2014-04-22 DIAGNOSIS — T8189XA Other complications of procedures, not elsewhere classified, initial encounter: Secondary | ICD-10-CM

## 2014-04-22 NOTE — Patient Instructions (Signed)
Will have you return to office to remove stitch in office.

## 2014-04-22 NOTE — Progress Notes (Signed)
Subjective:     Patient ID: Adam Cordova, male   DOB: 04/29/1956, 58 y.o.   MRN: [id number]  HPI Patient returns 8 months after umbilical hernia repair. He feels something poking him in his umbilicus. No redness or drainage. It feels "foreign" to him. No nausea or vomiting.  Review of Systems  Constitutional: Negative.   HENT: Negative.   Cardiovascular: Negative.   Gastrointestinal: Negative.        Objective:   Physical Exam  Constitutional: He appears well-developed and well-nourished.  Abdominal:    Skin: Skin is warm and dry.       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 Discussed with patient he agrees to proceed with this.

## 2014-05-10 ENCOUNTER — Ambulatory Visit (INDEPENDENT_AMBULATORY_CARE_PROVIDER_SITE_OTHER): Payer: BC Managed Care – PPO | Admitting: Surgery

## 2014-10-06 ENCOUNTER — Other Ambulatory Visit (HOSPITAL_COMMUNITY): Payer: Self-pay | Admitting: Urology

## 2014-10-06 DIAGNOSIS — C61 Malignant neoplasm of prostate: Secondary | ICD-10-CM

## 2014-10-27 ENCOUNTER — Other Ambulatory Visit: Payer: Self-pay | Admitting: Family Medicine

## 2014-10-27 ENCOUNTER — Ambulatory Visit
Admission: RE | Admit: 2014-10-27 | Discharge: 2014-10-27 | Disposition: A | Discharge status: Home / Self Care | Payer: Self-pay | Source: Ambulatory Visit | Attending: Family Medicine | Admitting: Family Medicine

## 2014-10-27 DIAGNOSIS — M79672 Pain in left foot: Secondary | ICD-10-CM

## 2014-11-02 ENCOUNTER — Ambulatory Visit (HOSPITAL_COMMUNITY)
Admission: RE | Admit: 2014-11-02 | Discharge: 2014-11-02 | Disposition: A | Discharge status: Home / Self Care | Payer: BLUE CROSS/BLUE SHIELD | Source: Ambulatory Visit | Attending: Urology | Admitting: Urology

## 2014-11-02 DIAGNOSIS — N400 Enlarged prostate without lower urinary tract symptoms: Secondary | ICD-10-CM | POA: Insufficient documentation

## 2014-11-02 DIAGNOSIS — C61 Malignant neoplasm of prostate: Secondary | ICD-10-CM

## 2014-11-02 LAB — POCT I-STAT CREATININE: CREATININE: 1 mg/dL (ref 0.50–1.35)

## 2014-11-02 MED ORDER — GADOBENATE DIMEGLUMINE 529 MG/ML IV SOLN
15.0000 mL | Freq: Once | INTRAVENOUS | Status: AC | PRN
  Administered 2014-11-02: 15 mL via INTRAVENOUS

## 2015-03-27 DIAGNOSIS — S92309A Fracture of unspecified metatarsal bone(s), unspecified foot, initial encounter for closed fracture: Secondary | ICD-10-CM | POA: Insufficient documentation

## 2015-07-24 ENCOUNTER — Other Ambulatory Visit: Payer: Self-pay | Admitting: Family Medicine

## 2015-07-24 ENCOUNTER — Ambulatory Visit
Admission: RE | Admit: 2015-07-24 | Discharge: 2015-07-24 | Disposition: A | Discharge status: Home / Self Care | Payer: PPO | Source: Ambulatory Visit | Attending: Family Medicine | Admitting: Family Medicine

## 2015-07-24 DIAGNOSIS — R52 Pain, unspecified: Secondary | ICD-10-CM

## 2015-08-31 ENCOUNTER — Ambulatory Visit: Payer: PPO | Admitting: Physical Therapy

## 2015-09-13 ENCOUNTER — Ambulatory Visit: Payer: PPO | Attending: Family Medicine | Admitting: Physical Therapy

## 2015-09-13 ENCOUNTER — Ambulatory Visit: Payer: PPO | Admitting: Physical Therapy

## 2015-09-13 DIAGNOSIS — R2681 Unsteadiness on feet: Secondary | ICD-10-CM | POA: Diagnosis not present

## 2015-09-13 DIAGNOSIS — R258 Other abnormal involuntary movements: Secondary | ICD-10-CM | POA: Diagnosis not present

## 2015-09-13 DIAGNOSIS — R269 Unspecified abnormalities of gait and mobility: Secondary | ICD-10-CM

## 2015-09-13 NOTE — Therapy (Signed)
[facility] [address] [place], [place], [address] Phone: [phone]   Fax:  [phone]  Physical Therapy Evaluation  Patient Details  Name: Adam Cordova MRN: [id number] Date of Birth: [birth year] Referring Provider: Marisue Humble  Encounter Date: 09/13/2015      PT End of Session - 09/13/15 1432    Visit Number 1   Number of Visits 9   Date for PT Re-Evaluation 11/10/15   Authorization Type [employer]    PT Start Time 0938   PT Stop Time 1008   PT Time Calculation (min) 30 min   Activity Tolerance Patient tolerated treatment well   Behavior During Therapy [facility] for tasks assessed/performed      Past Medical History  Diagnosis Date  . BPH (benign prostatic hypertrophy) with urinary retention   . Parkinson disease     neurologist--  dr Marcello Moores hickey  in [place]  . Foley catheter in place   . UTI (urinary tract infection)     Past Surgical History  Procedure Laterality Date  . Transurethral resection of prostate N/A 11/23/2012    Procedure: TRANSURETHRAL RESECTION OF THE PROSTATE WITH GYRUS INSTRUMENTS;  Surgeon: Bernestine Amass, MD;  Location: [facility];  Service: Urology;  Laterality: N/A;    There were no vitals filed for this visit.  Visit Diagnosis:  Abnormality of gait  Unsteadiness  Bradykinesia      Subjective Assessment - 09/13/15 0941    Subjective Pt is a 60 year old male who presents to OP PT with Parkinson's disease.  He reports having a fall around Thanksgiving, fracturing 3 ribs, puncturing a lung.  He had one fall prior to that fall.  Pt has 3 year history with PD.  He reports tremor, stiffness, shuffling steps with gait.   Patient Stated Goals Pt's goal for therapy is to continue (from HHPT) to improve walking and balance.   Currently in Pain? No/denies            [facility] PT Assessment - 09/13/15 0001    Assessment   Medical Diagnosis Parkinson disease    Referring Provider [place]   Precautions   Precautions Fall   Balance Screen   Has the patient fallen in the past 6 months Yes   How many times? 2   Has the patient had a decrease in activity level because of a fear of falling?  No   Is the patient reluctant to leave their home because of a fear of falling?  No   Home Ecologist residence   Living Arrangements Spouse/significant other   Available Help at Discharge Family   Type of [place] to enter   Entrance Stairs-Number of Steps 2   [place] One level  2 steps into living room   Prior Function   Level of Independence Independent with basic ADLs  slowed ADLs   Vocation Retired   Leisure Has enjoyed going to [employer] several times per week prior to injury   ROM / Strength   AROM / PROM / Strength AROM   AROM   Overall AROM  Within functional limits for tasks performed   Transfers   Transfers Sit to Stand;Stand to Sit   Sit to Stand 6: Modified independent (Device/Increase time);Without upper extremity assist;From chair/3-in-1   Five time sit to stand comments  12.78   Stand to Sit 6: Modified  independent (Device/Increase time);Without upper extremity assist;To chair/3-in-1   Comments Decreased forward lean initially; describes difficulty gettin up from lower surfaces   Ambulation/Gait   Ambulation/Gait Yes   Ambulation/Gait Assistance 5: Supervision   Ambulation/Gait Assistance Details Pt has decr. foot clearance with turns.  Pt has LOB when turning to sit in chair.  He notes some episodes with gait and turns.   Ambulation Distance (Feet) 200 Feet   Assistive device None   Gait Pattern Step-through pattern;Decreased arm swing - right;Decreased arm swing - left;Decreased step length - right;Decreased step length - left;Shuffle;Decreased trunk rotation;Narrow base of support;Poor foot clearance - left;Poor foot clearance - right   Ambulation  Surface Level;Indoor   Gait velocity 9.74= 3.37 ft/sec   Gait Comments 180 degree turn:  3.5 sec decr. foot clearance   Standardized Balance Assessment   Standardized Balance Assessment Timed Up and Go Test;Dynamic Gait Index   Balance Master Testing Limits of Stability   Dynamic Gait Index   Level Surface Mild Impairment   Change in Gait Speed Mild Impairment  veers to R   Gait with Horizontal Head Turns Moderate Impairment   Gait with Vertical Head Turns Mild Impairment   Gait and Pivot Turn Mild Impairment   Step Over Obstacle Mild Impairment   Step Around Obstacles Mild Impairment   Steps Mild Impairment   Total Score 15   DGI comment: Scores <19/24 are indicative of increased risk of falls   Timed Up and Go Test   Normal TUG (seconds) 13.28   Cognitive TUG (seconds) 11.92   High Level Balance   High Level Balance Comments Posterior push and release test:  pt takes 2 small steps to reegain balance                                PT Long Term Goals - 09/13/15 1500    PT LONG TERM GOAL #1   Title Pt will be independent with HEP for improved balance, gait, and functional mobility.  TARGET 10/14/15   Time 4   Period Weeks   Status New   PT LONG TERM GOAL #2   Title Pt will improve 5x sit<>stand to less than or equal to 12 sec for improved transfer effiemcy.   Time 4   Period Weeks   Status New   PT LONG TERM GOAL #3   Title Pt will perform  sit<>stand transfers 8 of 10 reps from less than 18 inches without UE support independenlty no posterior lean    Time 4   Period Weeks   Status New   PT LONG TERM GOAL #4   Title Pt will improve Dynamic Gait Index score to at least 19/24 for decreased fall risk.   Time 4   Period Weeks   Status New   PT LONG TERM GOAL #5   Title Pt will verbalize understanding of techniques to reduce freezing with gait.   Time 4   Period Weeks   Status New   PT LONG TERM GOAL #6   Title Pt will verbalize understanding of  local Parkinson's dsease resources.   Time 4   Period Weeks               Plan - 09/13/15 1434    Clinical Impression Statement Pt is a 60 year old male with 3 year history of Parkinson disease, with recent history of 2 falls since November 2016.  He has history of BPH, UTI, with rib fractures adnd punctured lung from fall.  Pt reports incr. difficulty with low surace transfers, freezing episodes with gait.  He is noted to have decreased balance, abnormality of gait, bradykinesia, decreased timing/coordination with gait.  Pt is at fall risk per DGI score.  Pts deficits are limiting pt's participation in [facility] exercise at [facility].  Pts presentation is evolving.   Pt will benefit from skilled therapeutic intervention in order to improve on the following deficits Abnormal gait;Decreased balance;Decreased mobility;Decreased strength;Difficulty walking;Postural dysfunction   Rehab Potential Good   PT Frequency 2x / week   PT Duration 4 weeks  plus eval   PT Treatment/Interventions ADLs/Self Care Home Management;Therapeutic exercise;Therapeutic activities;Functional mobility training;Gait training;Balance training;Neuromuscular re-education;Patient/family education   PT Next Visit Plan Sit<>stand transfer training from lower surfaces; initiate PWR! Moves exercises-sit, stand, techniques to decr. frezing with gait    Consulted and Agree with Plan of Care Patient;Family member/caregiver   Family Member Consulted wife         Problem List Patient Active Problem List   Diagnosis Date Noted  . 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 Acute urinary retention 11/23/2012  . BPH (benign prostatic hyperplasia) 11/23/2012    Keely Drennan W. 09/13/2015, 3:08 PM  Frazier Butt., PT  [facility] [address] [place], [place], [address] Phone: [phone]   Fax:  [phone]  Name: Adam Cordova MRN: [id number] Date of Birth:  February 16, 1956

## 2015-09-21 ENCOUNTER — Ambulatory Visit: Payer: PPO | Admitting: Physical Therapy

## 2015-09-21 DIAGNOSIS — R269 Unspecified abnormalities of gait and mobility: Secondary | ICD-10-CM | POA: Diagnosis not present

## 2015-09-21 NOTE — Addendum Note (Signed)
Addended by: Frazier Butt on: 09/21/2015 02:24 PM   Modules accepted: Orders

## 2015-09-21 NOTE — Therapy (Signed)
[facility] [address] [place], [place], [address] Phone: [phone]   Fax:  [phone]  Physical Therapy Treatment  Patient Details  Name: Adam Cordova MRN: [id number] Date of Birth: 04-28-1956 Referring Provider: Marisue Humble  Encounter Date: 09/21/2015      PT End of Session - 09/21/15 1236    Visit Number 2   Number of Visits 9   Date for PT Re-Evaluation 11/10/15   Authorization Type [employer]    PT Start Time 0935   PT Stop Time 1015   PT Time Calculation (min) 40 min   Activity Tolerance Patient tolerated treatment well   Behavior During Therapy [facility] for tasks assessed/performed      Past Medical History  Diagnosis Date  . BPH (benign prostatic hypertrophy) with urinary retention   . Parkinson disease     neurologist--  dr Marcello Moores hickey  in [place]  . Foley catheter in place   . UTI (urinary tract infection)     Past Surgical History  Procedure Laterality Date  . Transurethral resection of prostate N/A 11/23/2012    Procedure: TRANSURETHRAL RESECTION OF THE PROSTATE WITH GYRUS INSTRUMENTS;  Surgeon: Bernestine Amass, MD;  Location: [facility];  Service: Urology;  Laterality: N/A;    There were no vitals filed for this visit.  Visit Diagnosis:  Abnormality of gait      Subjective Assessment - 09/21/15 0939    Subjective Denies falls or changes since last visit.   Patient Stated Goals Pt's goal for therapy is to continue (from HHPT) to improve walking and balance.   Currently in Pain? No/denies        Performed PWR! Moves in Sitting and Standing x 20 reps with maximum verbal, visual and tactile cues.  Pt has increased difficulty following directions and technique despite these cues. Tried mirror for visual cues with little change in technique.        PT Long Term Goals - 09/13/15 1500    PT LONG TERM GOAL #1   Title Pt will be independent with HEP for improved  balance, gait, and functional mobility.  TARGET 10/14/15   Time 4   Period Weeks   Status New   PT LONG TERM GOAL #2   Title Pt will improve 5x sit<>stand to less than or equal to 12 sec for improved transfer effiemcy.   Time 4   Period Weeks   Status New   PT LONG TERM GOAL #3   Title Pt will perform  sit<>stand transfers 8 of 10 reps from less than 18 inches without UE support independenlty no posterior lean    Time 4   Period Weeks   Status New   PT LONG TERM GOAL #4   Title Pt will improve Dynamic Gait Index score to at least 19/24 for decreased fall risk.   Time 4   Period Weeks   Status New   PT LONG TERM GOAL #5   Title Pt will verbalize understanding of techniques to reduce freezing with gait.   Time 4   Period Weeks   Status New   PT LONG TERM GOAL #6   Title Pt will verbalize understanding of local Parkinson's dsease resources.   Time 4   Period Weeks               Plan - 09/21/15 1237    Clinical Impression Statement Pt has difficulty processing commands/technique for PWR! exercises today therefore did  not provide as HEP today.  Continue PT per POC.   Pt will benefit from skilled therapeutic intervention in order to improve on the following deficits Abnormal gait;Decreased balance;Decreased mobility;Decreased strength;Difficulty walking;Postural dysfunction   Rehab Potential Good   PT Frequency 2x / week   PT Duration 4 weeks  plus eval   PT Treatment/Interventions ADLs/Self Care Home Management;Therapeutic exercise;Therapeutic activities;Functional mobility training;Gait training;Balance training;Neuromuscular re-education;Patient/family education   PT Next Visit Plan Review PWR! moves sitting and standing and provide as HEP if pt able to follow technique.  Sit<>stand transfer training from lower surfaces;    Consulted and Agree with Plan of Care Patient        Problem List Patient Active Problem List   Diagnosis Date Noted  . Stitch granuloma  04/22/2014  . Acute urinary retention 11/23/2012  . BPH (benign prostatic hyperplasia) 11/23/2012    Narda Bonds 09/21/2015, 12:42 PM  [facility] [address] [place], [place], [address] Phone: [phone]   Fax:  [phone]  Name: Adam Cordova MRN: [id number] Date of Birth: 07-19-1956    Narda Bonds, [place] 09/21/2015 12:42 PM Phone: [phone] Fax: [phone]

## 2015-09-25 ENCOUNTER — Ambulatory Visit: Payer: PPO | Admitting: Physical Therapy

## 2015-09-25 ENCOUNTER — Encounter: Payer: Self-pay | Admitting: Physical Therapy

## 2015-09-25 DIAGNOSIS — R269 Unspecified abnormalities of gait and mobility: Secondary | ICD-10-CM | POA: Diagnosis not present

## 2015-09-25 DIAGNOSIS — R2681 Unsteadiness on feet: Secondary | ICD-10-CM

## 2015-09-25 DIAGNOSIS — R258 Other abnormal involuntary movements: Secondary | ICD-10-CM

## 2015-09-25 NOTE — Therapy (Signed)
[facility] [address] [place], [place], [address] Phone: [phone]   Fax:  [phone]  Physical Therapy Treatment  Patient Details  Name: Adam Cordova MRN: [id number] Date of Birth: [birth year] Referring Provider: Marisue Humble  Encounter Date: 09/25/2015      PT End of Session - 09/25/15 0935    Visit Number 3   Number of Visits 9   Date for PT Re-Evaluation 11/10/15   Authorization Type [employer]    PT Start Time 0932   PT Stop Time 1010   PT Time Calculation (min) 38 min   Activity Tolerance Patient tolerated treatment well   Behavior During Therapy [facility] for tasks assessed/performed      Past Medical History  Diagnosis Date  . BPH (benign prostatic hypertrophy) with urinary retention   . Parkinson disease [facility])     neurologist--  dr Richardean Chimera  in [place]  . Foley catheter in place   . UTI (urinary tract infection)     Past Surgical History  Procedure Laterality Date  . Transurethral resection of prostate N/A 11/23/2012    Procedure: TRANSURETHRAL RESECTION OF THE PROSTATE WITH GYRUS INSTRUMENTS;  Surgeon: Bernestine Amass, MD;  Location: [facility];  Service: Urology;  Laterality: N/A;    There were no vitals filed for this visit.  Visit Diagnosis:  Abnormality of gait  Unsteadiness  Bradykinesia      Subjective Assessment - 09/25/15 0935    Subjective No new complaints. No falls or pain to report.   Patient Stated Goals Pt's goal for therapy is to continue (from HHPT) to improve walking and balance.   Currently in Pain? No/denies            [facility] Adult PT Treatment/Exercise - 09/25/15 1426    Transfers   Number of Reps 10 reps;Other sets (comment)   Transfer Cueing cues on sequencing and technique   Comments 10 reps from lower mat table, 10 reps from chair without arm rests and 10 reps from simulated sofa. all with supervision and minimal cues                                         PWR [facility]) - 09/25/15 0936    PWR! exercises Moves in sitting;Moves in standing   PWR! Up 10   PWR! Rock 10   PWR! Twist 10   PWR Step 10   Comments verbal/visual cues on form and technique. Pt able to return demo after inital few reps performed   PWR! Up >10   PWR! Rock >10   PWR! Twist >10   PWR! Step >10   Comments verbal/visual cues on correct form and technique. pt able to return demo toward end with blocked practice.         PT Education - 09/25/15 1431    Education provided Yes   Education Details PWR! exercises in seated and standing   Person(s) Educated Patient   Methods Explanation;Demonstration;Handout   Comprehension Verbalized understanding;Returned demonstration;Need further instruction            PT Long Term Goals - 09/13/15 1500    PT LONG TERM GOAL #1   Title Pt will be independent with HEP for improved balance, gait, and functional mobility.  TARGET 10/14/15   Time 4   Period Weeks   Status New   PT LONG  TERM GOAL #2   Title Pt will improve 5x sit<>stand to less than or equal to 12 sec for improved transfer effiemcy.   Time 4   Period Weeks   Status New   PT LONG TERM GOAL #3   Title Pt will perform  sit<>stand transfers 8 of 10 reps from less than 18 inches without UE support independenlty no posterior lean    Time 4   Period Weeks   Status New   PT LONG TERM GOAL #4   Title Pt will improve Dynamic Gait Index score to at least 19/24 for decreased fall risk.   Time 4   Period Weeks   Status New   PT LONG TERM GOAL #5   Title Pt will verbalize understanding of techniques to reduce freezing with gait.   Time 4   Period Weeks   Status New   PT LONG TERM GOAL #6   Title Pt will verbalize understanding of local Parkinson's dsease resources.   Time 4   Period Weeks             Plan - 09/25/15 0935    Clinical Impression Statement Pt with improved technique and ability to coordinate PWR! exercises today. Issued  seated and standing PWR! exercises for HEP today. Worked on sit<>stand transfers as well from various surfaces with cues on technique and sequencing. Pt is making steady progress toward goals.                                      Pt will benefit from skilled therapeutic intervention in order to improve on the following deficits Abnormal gait;Decreased balance;Decreased mobility;Decreased strength;Difficulty walking;Postural dysfunction   Rehab Potential Good   PT Frequency 2x / week   PT Duration 4 weeks  plus eval   PT Treatment/Interventions ADLs/Self Care Home Management;Therapeutic exercise;Therapeutic activities;Functional mobility training;Gait training;Balance training;Neuromuscular re-education;Patient/family education   PT Next Visit Plan continue toward LTGs. Review PWR! exercises as well.    Consulted and Agree with Plan of Care Patient        Problem List Patient Active Problem List   Diagnosis Date Noted  . 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 Acute urinary retention 11/23/2012  . BPH (benign prostatic hyperplasia) 11/23/2012    Willow Ora 09/25/2015, 2:32 PM  Willow Ora, PTA, [facility] [address] [place], [place] [address] [phone] 09/25/2015, 2:32 PM   Name: Adam Cordova MRN: [id number] Date of Birth: 10/01/1955

## 2015-09-26 ENCOUNTER — Encounter: Payer: Self-pay | Admitting: Physical Therapy

## 2015-09-26 ENCOUNTER — Ambulatory Visit: Payer: PPO | Admitting: Physical Therapy

## 2015-09-26 DIAGNOSIS — R258 Other abnormal involuntary movements: Secondary | ICD-10-CM

## 2015-09-26 DIAGNOSIS — R269 Unspecified abnormalities of gait and mobility: Secondary | ICD-10-CM

## 2015-09-26 DIAGNOSIS — R2681 Unsteadiness on feet: Secondary | ICD-10-CM

## 2015-09-26 NOTE — Therapy (Signed)
[facility] [address] [place], [place], [address] Phone: [phone]   Fax:  [phone]  Physical Therapy Treatment  Patient Details  Name: Adam Cordova MRN: [id number] Date of Birth: Nov 03, 1955 Referring Provider: Marisue Humble  Encounter Date: 09/26/2015      PT End of Session - 09/26/15 0938    Visit Number 4   Number of Visits 9   Date for PT Re-Evaluation 11/10/15   Authorization Type [employer]    PT Start Time 0935   PT Stop Time 1015   PT Time Calculation (min) 40 min   Activity Tolerance Patient tolerated treatment well   Behavior During Therapy [facility] for tasks assessed/performed      Past Medical History  Diagnosis Date  . BPH (benign prostatic hypertrophy) with urinary retention   . Parkinson disease [facility])     neurologist--  dr Richardean Chimera  in [place]  . Foley catheter in place   . UTI (urinary tract infection)     Past Surgical History  Procedure Laterality Date  . Transurethral resection of prostate N/A 11/23/2012    Procedure: TRANSURETHRAL RESECTION OF THE PROSTATE WITH GYRUS INSTRUMENTS;  Surgeon: Bernestine Amass, MD;  Location: [facility];  Service: Urology;  Laterality: N/A;    There were no vitals filed for this visit.  Visit Diagnosis:  Abnormality of gait  Unsteadiness  Bradykinesia      Subjective Assessment - 09/26/15 0938    Subjective No new complaints. No falls or pain to report. Did have some lower back pain briefly late yesterday that went away before bed time.   Patient Stated Goals Pt's goal for therapy is to continue (from HHPT) to improve walking and balance.   Currently in Pain? No/denies             [facility] Adult PT Treatment/Exercise - 09/26/15 0956    Ambulation/Gait   Ambulation/Gait Yes   Ambulation/Gait Assistance 5: Supervision   Ambulation/Gait Assistance Details focused on reciprocal arm swing with gait, increased step/stride  length with gait and on upright posture. seated postural execises performed between gait sets.                                   Ambulation Distance (Feet) 230 Feet  x 3 reps   Assistive device None   Gait Pattern Step-through pattern;Decreased arm swing - right;Decreased arm swing - left;Decreased step length - right;Decreased step length - left;Shuffle;Decreased trunk rotation;Narrow base of support;Poor foot clearance - left;Poor foot clearance - right   Ambulation Surface Level;Indoor           PWR [facility]) - 09/26/15 [id number]    PWR! Up 10   PWR! Rock 10   PWR! Twist 10   PWR Step 10   Comments verbal/visual cues on sequencing and posture/ex form.   PWR! Up 10   PWR! Rock 10   PWR! Twist 10   PWR! Step 10   Comments min verbal/visual cues on sequencing and technique     seated exercises performed between gait sets: (total of 2 sets, 1 set during each rest break) With red thera band: - shoulder horizontal abduction 10 reps  - alternating diagonals x 10 reps        PT Education - 09/25/15 1431    Education provided Yes   Education Details PWR! exercises in seated and standing   Person(s)  Educated Patient   Methods Explanation;Demonstration;Handout   Comprehension Verbalized understanding;Returned demonstration;Need further instruction           PT Long Term Goals - 09/13/15 1500    PT LONG TERM GOAL #1   Title Pt will be independent with HEP for improved balance, gait, and functional mobility.  TARGET 10/14/15   Time 4   Period Weeks   Status New   PT LONG TERM GOAL #2   Title Pt will improve 5x sit<>stand to less than or equal to 12 sec for improved transfer effiemcy.   Time 4   Period Weeks   Status New   PT LONG TERM GOAL #3   Title Pt will perform  sit<>stand transfers 8 of 10 reps from less than 18 inches without UE support independenlty no posterior lean    Time 4   Period Weeks   Status New   PT LONG TERM GOAL #4   Title Pt will improve Dynamic Gait  Index score to at least 19/24 for decreased fall risk.   Time 4   Period Weeks   Status New   PT LONG TERM GOAL #5   Title Pt will verbalize understanding of techniques to reduce freezing with gait.   Time 4   Period Weeks   Status New   PT LONG TERM GOAL #6   Title Pt will verbalize understanding of local Parkinson's dsease resources.   Time 4   Period Weeks            Plan - 09/26/15 [id number]    Clinical Impression Statement Pt continues to need minimal cues for form and technique with PWR! exercises. Increased balance issues noted today with standing PWR! exercies, pt able to self recover with stepping strategies. Part of today's session also addressed gait and posture. Pt continues to need additiona gait training, especially for turns and sudden stopping. Pt is making stready progress toward goals.                                 Pt will benefit from skilled therapeutic intervention in order to improve on the following deficits Abnormal gait;Decreased balance;Decreased mobility;Decreased strength;Difficulty walking;Postural dysfunction   Rehab Potential Good   PT Frequency 2x / week   PT Duration 4 weeks  plus eval   PT Treatment/Interventions ADLs/Self Care Home Management;Therapeutic exercise;Therapeutic activities;Functional mobility training;Gait training;Balance training;Neuromuscular re-education;Patient/family education   PT Next Visit Plan Continue to work on [employer]! exercises, gait and balance (hip/ankle strategies)   Consulted and Agree with Plan of Care Patient        Problem List Patient Active Problem List   Diagnosis Date Noted  . 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 Acute urinary retention 11/23/2012  . BPH (benign prostatic hyperplasia) 11/23/2012    Willow Ora 09/26/2015, 5:00 PM  Willow Ora, PTA, [facility] [address] [place], [place] [address] [phone] 09/26/2015, 5:00 PM   Name: Adam Cordova MRN: [id number] Date  of Birth: 05-25-1956

## 2015-09-28 ENCOUNTER — Ambulatory Visit: Payer: PPO | Admitting: Physical Therapy

## 2015-09-28 ENCOUNTER — Encounter: Payer: Self-pay | Admitting: Physical Therapy

## 2015-09-28 DIAGNOSIS — R258 Other abnormal involuntary movements: Secondary | ICD-10-CM

## 2015-09-28 DIAGNOSIS — R269 Unspecified abnormalities of gait and mobility: Secondary | ICD-10-CM

## 2015-09-28 DIAGNOSIS — R2681 Unsteadiness on feet: Secondary | ICD-10-CM

## 2015-09-29 NOTE — Therapy (Signed)
[facility] [address] [place], [place], [address] Phone: [phone]   Fax:  [phone]  Physical Therapy Treatment  Patient Details  Name: Adam Cordova MRN: [id number] Date of Birth: 06-15-1956 Referring Provider: Marisue Humble  Encounter Date: 09/28/2015      PT End of Session - 09/28/15 0805    Visit Number 5   Number of Visits 9   Date for PT Re-Evaluation 11/10/15   Authorization Type [employer]    PT Start Time 0802   PT Stop Time 0845   PT Time Calculation (min) 43 min   Activity Tolerance Patient tolerated treatment well   Behavior During Therapy [facility] for tasks assessed/performed      Past Medical History  Diagnosis Date  . BPH (benign prostatic hypertrophy) with urinary retention   . Parkinson disease [facility])     neurologist--  dr Richardean Chimera  in [place]  . Foley catheter in place   . UTI (urinary tract infection)     Past Surgical History  Procedure Laterality Date  . Transurethral resection of prostate N/A 11/23/2012    Procedure: TRANSURETHRAL RESECTION OF THE PROSTATE WITH GYRUS INSTRUMENTS;  Surgeon: Bernestine Amass, MD;  Location: [facility];  Service: Urology;  Laterality: N/A;    There were no vitals filed for this visit.  Visit Diagnosis:  Abnormality of gait  Unsteadiness  Bradykinesia      Subjective Assessment - 09/28/15 0804    Subjective No  new complaints. No falls or pain to report. Not doing HEP much at home due to busy schedule (tring to sell house right now).   Patient Stated Goals Pt's goal for therapy is to continue (from HHPT) to improve walking and balance.   Currently in Pain? No/denies          [facility] Adult PT Treatment/Exercise - 09/28/15 0806    Transfers   Number of Reps 10 reps;Other sets (comment)  3 sets   Transfer Cueing cues to scoot forward more on mat and for anterior weight shifting   Comments x 10 with feet on airex, x 10 with  feet on balance board (both ways)   Ambulation/Gait   Ambulation/Gait Yes   Ambulation/Gait Assistance 5: Supervision   Ambulation/Gait Assistance Details gait training focused on larger step/stride length, reciprocal arm swing and posture with gait. worked on head turns/enviroment scanning during portion of gait with increased unsteadiness noted (veering, shorter/shuffled steps), with cues pt able to minimaly correct this.                                      Ambulation Distance (Feet) 400 Feet  plus 6 minutes on gait trainer   Assistive device None   Gait Pattern Step-through pattern;Decreased arm swing - right;Decreased arm swing - left;Decreased step length - right;Decreased step length - left;Shuffle;Decreased trunk rotation;Narrow base of support;Poor foot clearance - left;Poor foot clearance - right   Ambulation Surface Level;Indoor           PWR [facility]) - 09/28/15 [id number]    PWR! Up 20   PWR! Rock 20   PWR! Twist 20   PWR Step 20   Comments minimal cues on form/posture and technique   PWR! Up 10   PWR! Rock 10   PWR! Twist 10   PWR! Step 10   Comments min cues needed on form and technique  PT Long Term Goals - 09/13/15 1500    PT LONG TERM GOAL #1   Title Pt will be independent with HEP for improved balance, gait, and functional mobility.  TARGET 10/14/15   Time 4   Period Weeks   Status New   PT LONG TERM GOAL #2   Title Pt will improve 5x sit<>stand to less than or equal to 12 sec for improved transfer effiemcy.   Time 4   Period Weeks   Status New   PT LONG TERM GOAL #3   Title Pt will perform  sit<>stand transfers 8 of 10 reps from less than 18 inches without UE support independenlty no posterior lean    Time 4   Period Weeks   Status New   PT LONG TERM GOAL #4   Title Pt will improve Dynamic Gait Index score to at least 19/24 for decreased fall risk.   Time 4   Period Weeks   Status New   PT LONG TERM GOAL #5   Title Pt will verbalize  understanding of techniques to reduce freezing with gait.   Time 4   Period Weeks   Status New   PT LONG TERM GOAL #6   Title Pt will verbalize understanding of local Parkinson's dsease resources.   Time 4   Period Weeks           Plan - 09/28/15 0805    Clinical Impression Statement Less cues needed with PWR! moves exercises today. Pt making progress toward goals.   Pt will benefit from skilled therapeutic intervention in order to improve on the following deficits Abnormal gait;Decreased balance;Decreased mobility;Decreased strength;Difficulty walking;Postural dysfunction   Rehab Potential Good   PT Frequency 3x / week   PT Duration 4 weeks  plus eval   PT Treatment/Interventions ADLs/Self Care Home Management;Therapeutic exercise;Therapeutic activities;Functional mobility training;Gait training;Balance training;Neuromuscular re-education;Patient/family education   PT Next Visit Plan Continue to work on [employer]! exercises, gait and balance (hip/ankle strategies)   Consulted and Agree with Plan of Care Patient        Problem List Patient Active Problem List   Diagnosis Date Noted  . 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 Acute urinary retention 11/23/2012  . BPH (benign prostatic hyperplasia) 11/23/2012    Willow Ora 09/29/2015, 3:06 PM  Willow Ora, PTA, [facility] [address], [place], [place] [address] [phone] 09/29/2015, 3:06 PM   Name: Adam Cordova MRN: [id number] Date of Birth: 20-Nov-1955

## 2015-10-03 ENCOUNTER — Ambulatory Visit: Payer: PPO | Admitting: Physical Therapy

## 2015-10-03 DIAGNOSIS — R269 Unspecified abnormalities of gait and mobility: Secondary | ICD-10-CM | POA: Diagnosis not present

## 2015-10-03 DIAGNOSIS — R258 Other abnormal involuntary movements: Secondary | ICD-10-CM

## 2015-10-03 DIAGNOSIS — R2681 Unsteadiness on feet: Secondary | ICD-10-CM

## 2015-10-03 NOTE — Therapy (Signed)
[facility] [address] [place], [place], [address] Phone: [phone]   Fax:  [phone]  Physical Therapy Treatment  Patient Details  Name: Adam Cordova MRN: [id number] Date of Birth: Jan 17, 1956 Referring Provider: Marisue Humble  Encounter Date: 10/03/2015      PT End of Session - 10/03/15 1918    Visit Number 6   Number of Visits 9   Date for PT Re-Evaluation 11/10/15   Authorization Type [employer]    PT Start Time 0932   PT Stop Time 1015   PT Time Calculation (min) 43 min   Activity Tolerance Patient tolerated treatment well   Behavior During Therapy [facility] for tasks assessed/performed      Past Medical History  Diagnosis Date  . BPH (benign prostatic hypertrophy) with urinary retention   . Parkinson disease [facility])     neurologist--  dr Richardean Chimera  in [place]  . Foley catheter in place   . UTI (urinary tract infection)     Past Surgical History  Procedure Laterality Date  . Transurethral resection of prostate N/A 11/23/2012    Procedure: TRANSURETHRAL RESECTION OF THE PROSTATE WITH GYRUS INSTRUMENTS;  Surgeon: Bernestine Amass, MD;  Location: [facility];  Service: Urology;  Laterality: N/A;    There were no vitals filed for this visit.  Visit Diagnosis:  Bradykinesia  Unsteadiness      Subjective Assessment - 10/03/15 0935    Subjective Had a fall going down the steps yesterday at a neighbor's house-no pain; was able to get right up.     Patient Stated Goals Pt's goal for therapy is to continue (from HHPT) to improve walking and balance.   Currently in Pain? No/denies                         [facility] Adult PT Treatment/Exercise - 10/03/15 1417    High Level Balance   High Level Balance Comments In corner-heel/toe raises x 20 reps with UE support, then wall bumps x 15 reps for hip/ankle strategy work.           PWR [facility]) - 10/03/15 [id number]    PWR!  exercises Moves in sitting;Moves in standing   PWR! Up 20   PWR! Rock 20   PWR! Twist 20   PWR Step 20  forward, back, side step and weight shift   Comments minimal cues for posture and for deliberate, large amplitude movement patterns   PWR! Up 20   PWR! Rock 20   PWR! Twist 20   PWR! Step 20   Comments min cues needed for technique and large amplitude movement             PT Education - 10/03/15 1418    Education provided Yes   Education Details Review of PWR! seated and standing exercises; need for consistent performance of HEP daily despite pt's schedule with selling home/process of moving   Person(s) Educated Patient   Methods Explanation;Demonstration   Comprehension Verbalized understanding;Returned demonstration;Verbal cues required;Need further instruction             PT Long Term Goals - 09/13/15 1500    PT LONG TERM GOAL #1   Title Pt will be independent with HEP for improved balance, gait, and functional mobility.  TARGET 10/14/15   Time 4   Period Weeks   Status New   PT LONG TERM GOAL #2   Title Pt will improve  5x sit<>stand to less than or equal to 12 sec for improved transfer effiemcy.   Time 4   Period Weeks   Status New   PT LONG TERM GOAL #3   Title Pt will perform  sit<>stand transfers 8 of 10 reps from less than 18 inches without UE support independenlty no posterior lean    Time 4   Period Weeks   Status New   PT LONG TERM GOAL #4   Title Pt will improve Dynamic Gait Index score to at least 19/24 for decreased fall risk.   Time 4   Period Weeks   Status New   PT LONG TERM GOAL #5   Title Pt will verbalize understanding of techniques to reduce freezing with gait.   Time 4   Period Weeks   Status New   PT LONG TERM GOAL #6   Title Pt will verbalize understanding of local Parkinson's dsease resources.   Time 4   Period Weeks               Plan - 10/03/15 1919    Clinical Impression Statement Pt continues to need cues for  deliberateness, large amplitude movememt patterns with PWR! Moves exercises.  Focused practiced on varied balance strategies including ankle, hip, and step strategies in varied directions.  Pt has difficulty coordinating step and weightshift in varied directions.  Pt will conitnue to beneift from further skilled PT to address balance and gait.   Pt will benefit from skilled therapeutic intervention in order to improve on the following deficits Abnormal gait;Decreased balance;Decreased mobility;Decreased strength;Difficulty walking;Postural dysfunction   Rehab Potential Good   PT Frequency 3x / week   PT Duration 4 weeks  plus eval   PT Treatment/Interventions ADLs/Self Care Home Management;Therapeutic exercise;Therapeutic activities;Functional mobility training;Gait training;Balance training;Neuromuscular re-education;Patient/family education   PT Next Visit Plan Gait and balance activities, change of directions, turns   Consulted and Agree with Plan of Care Patient        Problem List Patient Active Problem List   Diagnosis Date Noted  . 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 Acute urinary retention 11/23/2012  . BPH (benign prostatic hyperplasia) 11/23/2012    Shawntrice Salle W. 10/03/2015, 7:24 PM  Frazier Butt., PT  [facility] [address] [place], [place], [address] Phone: [phone]   Fax:  [phone]  Name: Adam Cordova MRN: [id number] Date of Birth: 1956-03-24

## 2015-10-04 ENCOUNTER — Ambulatory Visit: Payer: PPO | Attending: Family Medicine | Admitting: Physical Therapy

## 2015-10-04 DIAGNOSIS — R258 Other abnormal involuntary movements: Secondary | ICD-10-CM

## 2015-10-04 DIAGNOSIS — R2681 Unsteadiness on feet: Secondary | ICD-10-CM | POA: Diagnosis not present

## 2015-10-04 DIAGNOSIS — R269 Unspecified abnormalities of gait and mobility: Secondary | ICD-10-CM | POA: Diagnosis not present

## 2015-10-04 NOTE — Therapy (Signed)
[facility] [address] [place], [place], [address] Phone: [phone]   Fax:  [phone]  Physical Therapy Treatment  Patient Details  Name: Adam Cordova MRN: [id number] Date of Birth: 1956-02-04 Referring Provider: Marisue Humble  Encounter Date: 10/04/2015      PT End of Session - 10/04/15 1608    Visit Number 7   Number of Visits 9   Date for PT Re-Evaluation 11/10/15   Authorization Type [employer]    PT Start Time 0934   PT Stop Time 1016   PT Time Calculation (min) 42 min   Equipment Utilized During Treatment Gait belt   Activity Tolerance Patient tolerated treatment well   Behavior During Therapy [facility] for tasks assessed/performed      Past Medical History  Diagnosis Date  . BPH (benign prostatic hypertrophy) with urinary retention   . Parkinson disease [facility])     neurologist--  dr Richardean Chimera  in [place]  . Foley catheter in place   . UTI (urinary tract infection)     Past Surgical History  Procedure Laterality Date  . Transurethral resection of prostate N/A 11/23/2012    Procedure: TRANSURETHRAL RESECTION OF THE PROSTATE WITH GYRUS INSTRUMENTS;  Surgeon: Bernestine Amass, MD;  Location: [facility]  Service: Urology;  Laterality: N/A;    There were no vitals filed for this visit.  Visit Diagnosis:  Unsteadiness  Bradykinesia  Abnormality of gait      Subjective Assessment - 10/04/15 0933    Subjective I'm a little stiff this morning.  Went to the [employer] basketball game last night.  Was a little nervous, but no problems.   Patient Stated Goals Pt's goal for therapy is to continue (from HHPT) to improve walking and balance.   Currently in Pain? No/denies                         [facility] Adult PT Treatment/Exercise - 10/04/15 0942    Ambulation/Gait   Ambulation/Gait Yes   Ambulation/Gait Assistance 4: Min guard   Ambulation/Gait Assistance Details Cues  provided for increased step length, relaxed/reciprocal arm swing, upright posture during gait.  PT trialed assisting at shoulders for improved trunk rotation.     Ambulation Distance (Feet) 400 Feet  x 3   Assistive device None   Gait Pattern Step-through pattern;Decreased arm swing - right;Decreased arm swing - left;Decreased step length - right;Decreased step length - left;Shuffle;Decreased trunk rotation;Narrow base of support;Poor foot clearance - left;Poor foot clearance - right   Pre-Gait Activities Stagger stance forward/back weightshifting 15 reps x 2, with second set coordinating arm swing   Gait Comments Gait with quick start/stops with cues for foot placement, to decrease pt's tendency to go up on toes.  Tried significantly slowed gait pattern (step and stop) to assist with coordination of reciprocal arm swing, however, pt begins near-scissoring gait pattern and quickly reverts away from reciprocal arm swing (to ipslateral arm swing)   High Level Balance   High Level Balance Comments Lateral weigthshifting at counter, with step and lift for improved lateral weightshifting, 15 reps.  Progressed to lateral weigthshifting practice with use of balance disks for improved 360 turns.   Exercises   Exercises Knee/Hip   Knee/Hip Exercises: Aerobic   Stepper SciFit Seated Stepper, Level 1.8>2.2 4 extremities x 8 minutes, RPM above 105 for increased intensity of movement      Corner balance activities for improved trunk rotation-reaching  across body to touch wall x 10 reps, for improve weightshifting through trunk and improved trunk rotation.               PT Long Term Goals - 09/13/15 1500    PT LONG TERM GOAL #1   Title Pt will be independent with HEP for improved balance, gait, and functional mobility.  TARGET 10/14/15   Time 4   Period Weeks   Status New   PT LONG TERM GOAL #2   Title Pt will improve 5x sit<>stand to less than or equal to 12 sec for improved transfer effiemcy.    Time 4   Period Weeks   Status New   PT LONG TERM GOAL #3   Title Pt will perform  sit<>stand transfers 8 of 10 reps from less than 18 inches without UE support independenlty no posterior lean    Time 4   Period Weeks   Status New   PT LONG TERM GOAL #4   Title Pt will improve Dynamic Gait Index score to at least 19/24 for decreased fall risk.   Time 4   Period Weeks   Status New   PT LONG TERM GOAL #5   Title Pt will verbalize understanding of techniques to reduce freezing with gait.   Time 4   Period Weeks   Status New   PT LONG TERM GOAL #6   Title Pt will verbalize understanding of local Parkinson's dsease resources.   Time 4   Period Weeks               Plan - 10/04/15 1608    Clinical Impression Statement Pt responds best to gait cues today for upright posture with increased step length and relaxed arm swing at a comfortable speed.  Pt reports he has difficulty with coordination of slowed gait.  Pt able to sequence arm swing with anterior/posterior weigthshifting at counter.  Pt does have tendency to have forward lean up on toes with quick stops.  Pt will continue to benefit from further skilled PT to address balance, gait, posture concerns.   Pt will benefit from skilled therapeutic intervention in order to improve on the following deficits Abnormal gait;Decreased balance;Decreased mobility;Decreased strength;Difficulty walking;Postural dysfunction   Rehab Potential Good   PT Frequency 3x / week   PT Duration 4 weeks  plus eval   PT Treatment/Interventions ADLs/Self Care Home Management;Therapeutic exercise;Therapeutic activities;Functional mobility training;Gait training;Balance training;Neuromuscular re-education;Patient/family education   PT Next Visit Plan Gait and balance activities, change of directions, turns; discuss priority of exercise/activities per pt request as he is going on cruise next week, discuss fall prevention   Consulted and Agree with Plan of  Care Patient        Problem List Patient Active Problem List   Diagnosis Date Noted  . 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 Acute urinary retention 11/23/2012  . BPH (benign prostatic hyperplasia) 11/23/2012    Adam Renwick W. 10/04/2015, 4:13 PM Frazier Butt., PT [facility] [address] [place], [place], [address] Phone: [phone]   Fax:  [phone]  Name: Adam Cordova MRN: [id number] Date of Birth: 04-Oct-1955

## 2015-10-05 ENCOUNTER — Ambulatory Visit: Payer: PPO | Admitting: Physical Therapy

## 2015-10-05 DIAGNOSIS — R2681 Unsteadiness on feet: Secondary | ICD-10-CM | POA: Diagnosis not present

## 2015-10-05 DIAGNOSIS — R269 Unspecified abnormalities of gait and mobility: Secondary | ICD-10-CM

## 2015-10-05 NOTE — Patient Instructions (Addendum)
[employer] Program after Therapy for People with Parkinson's Disease  1)  Therapy Home Exercise Program  -Do these Exercises DAILY as instructed by your therapist  -Big, deliberate effort with exercises  -These exercises are important to perform consistently, even when therapist has  finished, because these therapy exercises often address your specific  Parkinson's difficulties   2)  Walking  -  Work up to walking 3-5 times per week, 20-30 minutes per day  -This can be done at home, driveway, quiet street or an indoor track  -Focus should be on your Best posture, arm swing, step length for your best  walking pattern  3)  Aerobic Exercise  -Work up to 3-5 times per week, 30 minutes per day  -This can be stationary bike, seated stepper machine, elliptical machine  -Work up to 7-8/10 intensity during the exercise, at minimal to moderate     Resistance      (Exercise) Monday Tuesday Wednesday Thursday Friday Saturday Sunday                                                                                                       Fall prevention information provided  Provided patient with LSVT Based handout on forward/back rock and reach, with instructions to hold to counter and rock forward/back with staggered stance, 10 reps each foot position, 1-2 times per day.

## 2015-10-06 NOTE — Therapy (Signed)
[facility] [address] [place], [place], [address] Phone: [phone]   Fax:  [phone]  Physical Therapy Treatment  Patient Details  Name: Adam Cordova MRN: [id number] Date of Birth: 1955-09-07 Referring Provider: Marisue Humble  Encounter Date: 10/05/2015      PT End of Session - 10/06/15 0916    Visit Number 8   Number of Visits 13  corrected as per POC eval + 12 visits-AWM   Date for PT Re-Evaluation 11/10/15   Authorization Type [employer]    PT Start Time 0933   PT Stop Time 1017   PT Time Calculation (min) 44 min   Equipment Utilized During Treatment Gait belt   Activity Tolerance Patient tolerated treatment well   Behavior During Therapy [facility] for tasks assessed/performed      Past Medical History  Diagnosis Date  . BPH (benign prostatic hypertrophy) with urinary retention   . Parkinson disease [facility])     neurologist--  dr Richardean Chimera  in [place]  . Foley catheter in place   . UTI (urinary tract infection)     Past Surgical History  Procedure Laterality Date  . Transurethral resection of prostate N/A 11/23/2012    Procedure: TRANSURETHRAL RESECTION OF THE PROSTATE WITH GYRUS INSTRUMENTS;  Surgeon: Bernestine Amass, MD;  Location: [facility];  Service: Urology;  Laterality: N/A;    There were no vitals filed for this visit.  Visit Diagnosis:  Abnormality of gait      Subjective Assessment - 10/05/15 0935    Subjective No stiffness or pain, "so far, so good" this morning.  Leaving later this weekend on a cruise next week.   Currently in Pain? No/denies                         [facility] Adult PT Treatment/Exercise - 10/06/15 0001    High Level Balance   High Level Balance Activities Backward walking;Sudden stops  Forward/back walking in gym area   High Level Balance Comments At counter-stagger stance weightshifting in anterior/posterior direction, 2 sets x 10  reps, with addition of coordinated arm swing on one side (other side holds at counter).  Short distance start/stops with gait, with cues for widened BOS and weightshifting through hips.          Self Care:   (See instructions for further details)      PT Education - 10/06/15 0915    Education provided Yes   Education Details Educated in fall prevention, exercise chart/optimal exercise plans (as pt goes on his cruise he wants to make sure to be doing correct amount of exercise), forward/back rocking at [employer]) Educated Patient   Methods Explanation;Demonstration;Handout   Comprehension Verbalized understanding;Returned demonstration;Verbal cues required             PT Long Term Goals - 09/13/15 1500    PT LONG TERM GOAL #1   Title Pt will be independent with HEP for improved balance, gait, and functional mobility.  TARGET 10/14/15   Time 4   Period Weeks   Status New   PT LONG TERM GOAL #2   Title Pt will improve 5x sit<>stand to less than or equal to 12 sec for improved transfer effiemcy.   Time 4   Period Weeks   Status New   PT LONG TERM GOAL #3   Title Pt will perform  sit<>stand transfers 8 of 10 reps from less  than 18 inches without UE support independenlty no posterior lean    Time 4   Period Weeks   Status New   PT LONG TERM GOAL #4   Title Pt will improve Dynamic Gait Index score to at least 19/24 for decreased fall risk.   Time 4   Period Weeks   Status New   PT LONG TERM GOAL #5   Title Pt will verbalize understanding of techniques to reduce freezing with gait.   Time 4   Period Weeks   Status New   PT LONG TERM GOAL #6   Title Pt will verbalize understanding of local Parkinson's dsease resources.   Time 4   Period Weeks               Plan - 10/06/15 [id number]    Clinical Impression Statement Today's session focused on education for fall prevention and optimal exercise routine, as pt is going on cruise next week.  Pt appears to have  decreased incidence of forward up on toes lean/near loss of balance with start/stop actitivies, with more improved use of hip strategy/weightshifting through hips.  Pt will continue to benefit from further skilled PT to address balance , posture, gait.   Pt will benefit from skilled therapeutic intervention in order to improve on the following deficits Abnormal gait;Decreased balance;Decreased mobility;Decreased strength;Difficulty walking;Postural dysfunction   Rehab Potential Good   PT Frequency 3x / week   PT Duration 4 weeks  plus eval   PT Treatment/Interventions ADLs/Self Care Home Management;Therapeutic exercise;Therapeutic activities;Functional mobility training;Gait training;Balance training;Neuromuscular re-education;Patient/family education   PT Next Visit Plan Gait and balance activities, change of directions, turns; weightshifting with balance disks; begin checking LTGs and discuss POC   Consulted and Agree with Plan of Care Patient        Problem List Patient Active Problem List   Diagnosis Date Noted  . 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 Acute urinary retention 11/23/2012  . BPH (benign prostatic hyperplasia) 11/23/2012    Odetta Forness W. 10/06/2015, 9:20 AM  Frazier Butt., PT [facility] [address] [place], [place], [address] Phone: [phone]   Fax:  [phone]  Name: Adam Cordova MRN: [id number] Date of Birth: December 07, 1955

## 2015-10-17 ENCOUNTER — Ambulatory Visit: Payer: PPO

## 2015-10-17 DIAGNOSIS — R269 Unspecified abnormalities of gait and mobility: Secondary | ICD-10-CM

## 2015-10-17 DIAGNOSIS — R258 Other abnormal involuntary movements: Secondary | ICD-10-CM

## 2015-10-17 DIAGNOSIS — R2681 Unsteadiness on feet: Secondary | ICD-10-CM | POA: Diagnosis not present

## 2015-10-17 NOTE — Therapy (Signed)
[facility] [address] [place], [place], [address] Phone: [phone]   Fax:  [phone]  Physical Therapy Treatment  Patient Details  Name: Adam Cordova MRN: [id number] Date of Birth: 1956-02-20 Referring Provider: Marisue Humble  Encounter Date: 10/17/2015      PT End of Session - 10/17/15 1433    Visit Number 9   Number of Visits 13   Date for PT Re-Evaluation 11/10/15   Authorization Type [employer]    PT Start Time 0930   PT Stop Time 1012   PT Time Calculation (min) 42 min   Equipment Utilized During Treatment Gait belt   Activity Tolerance Patient tolerated treatment well   Behavior During Therapy [facility] for tasks assessed/performed      Past Medical History  Diagnosis Date  . BPH (benign prostatic hypertrophy) with urinary retention   . Parkinson disease [facility])     neurologist--  dr Richardean Chimera  in [place]  . Foley catheter in place   . UTI (urinary tract infection)     Past Surgical History  Procedure Laterality Date  . Transurethral resection of prostate N/A 11/23/2012    Procedure: TRANSURETHRAL RESECTION OF THE PROSTATE WITH GYRUS INSTRUMENTS;  Surgeon: Bernestine Amass, MD;  Location: [facility];  Service: Urology;  Laterality: N/A;    There were no vitals filed for this visit.  Visit Diagnosis:  Abnormality of gait  Unsteadiness  Bradykinesia      Subjective Assessment - 10/17/15 0933    Subjective Pt reported he had a good time while on vacation (cruise) last week and denied falls or changes. Pt unable to verbalize ways to reduce freezing techniques or Parkinson's disease resources.   Patient Stated Goals Pt's goal for therapy is to continue (from HHPT) to improve walking and balance.   Currently in Pain? No/denies                         [facility] Adult PT Treatment/Exercise - 10/17/15 0934    Transfers   Transfers Sit to Stand;Stand to Sit   Sit to  Stand 7: Independent;Without upper extremity assist;From chair/3-in-1  and mat x10 reps each   Five time sit to stand comments  9.7 sec.    Stand to Sit 7: Independent;Without upper extremity assist;To chair/3-in-1  and mat x10 reps each.   Number of Reps 10 reps   Transfer Cueing No cues required, pt demonstrated proper technique and improved ant. wt. shifting.   Ambulation/Gait   Ambulation/Gait Yes   Ambulation/Gait Assistance 5: Supervision;4: Min guard   Ambulation/Gait Assistance Details Cues to improve narrow BOS, reciprocal arm swing and upright posture. pt noted to improve arm swing with use of walking poles and PT's min A guiding poles.   Ambulation Distance (Feet) --  85' with walking poles, 43' and 117' no poles   Assistive device None   Gait Pattern Step-through pattern;Decreased arm swing - right;Decreased arm swing - left;Decreased step length - right;Decreased step length - left;Shuffle;Decreased trunk rotation;Narrow base of support;Poor foot clearance - left;Poor foot clearance - right   Standardized Balance Assessment   Standardized Balance Assessment Dynamic Gait Index   Dynamic Gait Index   Level Surface Mild Impairment   Change in Gait Speed Normal   Gait with Horizontal Head Turns Mild Impairment   Gait with Vertical Head Turns Mild Impairment   Gait and Pivot Turn Mild Impairment   Step Over Obstacle  Normal   Step Around Obstacles Normal   Steps Mild Impairment  step over step to ascend,but required one HR to descend   Total Score 19     Neuro re-ed: Pt reviewed and performed PWR! HEP in standing and seated positions. Pt required extensive VC's and tactile cues during all activities. Performed with min guard-S for safety. x10 reps/activity            PT Education - 10/17/15 1432    Education provided Yes   Education Details PT discussed goal progress and outcome measure results. PT discussed the benefits of performing PWR! HEP and benefits of  additional PT. Pt will discuss during next appt. with primary PT.  PT reviewed PWR! seated and standing HEP.    Person(s) Educated Patient   Methods Explanation;Demonstration;Tactile cues;Verbal cues;Handout   Comprehension Verbalized understanding;Returned demonstration;Need further instruction             PT Long Term Goals - 10/17/15 1436    PT LONG TERM GOAL #1   Title Pt will be independent with HEP for improved balance, gait, and functional mobility.  TARGET 10/14/15   Time 4   Period Weeks   Status Not Met   PT LONG TERM GOAL #2   Title Pt will improve 5x sit<>stand to less than or equal to 12 sec for improved transfer effiemcy.   Time 4   Period Weeks   Status Achieved   PT LONG TERM GOAL #3   Title Pt will perform  sit<>stand transfers 8 of 10 reps from less than 18 inches without UE support independenlty no posterior lean    Time 4   Period Weeks   Status Achieved   PT LONG TERM GOAL #4   Title Pt will improve Dynamic Gait Index score to at least 19/24 for decreased fall risk.   Time 4   Period Weeks   Status Achieved   PT LONG TERM GOAL #5   Title Pt will verbalize understanding of techniques to reduce freezing with gait.   Time 4   Period Weeks   Status Not Met   PT LONG TERM GOAL #6   Title Pt will verbalize understanding of local Parkinson's dsease resources.   Time 4   Period Weeks   Status Not Met               Plan - 10/17/15 1434    Clinical Impression Statement Pt demonstrated progess as he met LTGs 2, 3, and 4. Pt did not meet LTGs 1, 5 and 6. Pt continues to experience incr. postural sway and difficulty coordinating reciprocal arm swing during amb. Pt will discuss renewal with primary PT at next visit. If pt and primary PT agree to renew pt, all unmet goals will be carried over to new POC and new DGI goal will be set. Pt would continue to benefit from skilled PT to improve safety during functional mobility.    Pt will benefit from skilled  therapeutic intervention in order to improve on the following deficits Abnormal gait;Decreased balance;Decreased mobility;Decreased strength;Difficulty walking;Postural dysfunction   Rehab Potential Good   PT Frequency 3x / week   PT Duration 4 weeks   PT Treatment/Interventions ADLs/Self Care Home Management;Therapeutic exercise;Therapeutic activities;Functional mobility training;Gait training;Balance training;Neuromuscular re-education;Patient/family education   PT Next Visit Plan Discuss PT renewal, Parkinon's disease resources and re-educate pt on freezing techniques. Gait: reciprocal arm swing, turns, wt. shifting with balance disks.Update goals prn.    PT Home Exercise Plan  PWR! seated and standing.    Consulted and Agree with Plan of Care Patient        Problem List Patient Active Problem List   Diagnosis Date Noted  . 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 Acute urinary retention 11/23/2012  . BPH (benign prostatic hyperplasia) 11/23/2012    Tyneisha Hegeman L 10/17/2015, 2:39 PM  [facility] [address] [place], [place], [address] Phone: [phone]   Fax:  [phone]  Name: Adam Cordova MRN: [id number] Date of Birth: 01/28/1956    Geoffry Paradise, PT,DPT 10/17/2015 2:39 PM Phone: [phone] Fax: [phone]

## 2015-10-18 ENCOUNTER — Ambulatory Visit: Payer: PPO | Admitting: Physical Therapy

## 2015-10-18 DIAGNOSIS — R2681 Unsteadiness on feet: Secondary | ICD-10-CM | POA: Diagnosis not present

## 2015-10-18 DIAGNOSIS — R258 Other abnormal involuntary movements: Secondary | ICD-10-CM

## 2015-10-18 DIAGNOSIS — R269 Unspecified abnormalities of gait and mobility: Secondary | ICD-10-CM

## 2015-10-18 NOTE — Patient Instructions (Signed)

## 2015-10-19 DIAGNOSIS — G20 Parkinson's disease: Secondary | ICD-10-CM | POA: Diagnosis not present

## 2015-10-19 NOTE — Therapy (Signed)
[facility] [address] [place], [place], [address] Phone: [phone]   Fax:  [phone]  Physical Therapy Treatment  Patient Details  Name: Adam Cordova MRN: [id number] Date of Birth: Nov 19, 1955 Referring Provider: Marisue Humble  Encounter Date: 10/18/2015      PT End of Session - 10/19/15 0748    Visit Number 10   Number of Visits 13   Date for PT Re-Evaluation 11/10/15   Authorization Type [employer]    PT Start Time 0935   PT Stop Time 1018   PT Time Calculation (min) 43 min   Equipment Utilized During Treatment Gait belt   Activity Tolerance Patient tolerated treatment well   Behavior During Therapy [facility] for tasks assessed/performed      Past Medical History  Diagnosis Date  . BPH (benign prostatic hypertrophy) with urinary retention   . Parkinson disease [facility])     neurologist--  dr Richardean Chimera  in [place]  . Foley catheter in place   . UTI (urinary tract infection)     Past Surgical History  Procedure Laterality Date  . Transurethral resection of prostate N/A 11/23/2012    Procedure: TRANSURETHRAL RESECTION OF THE PROSTATE WITH GYRUS INSTRUMENTS;  Surgeon: Bernestine Amass, MD;  Location: [facility];  Service: Urology;  Laterality: N/A;    There were no vitals filed for this visit.  Visit Diagnosis:  Abnormality of gait  Unsteadiness  Bradykinesia      Subjective Assessment - 10/18/15 0940    Subjective Still wants to work on freezing and turning.  No falls in the past 2 months or so.  At end of session, pt verbalizes that he needs to do the exercises at home for them to make a difference and is unsure about prioritizing this therapy's ex versus HHPT exercises from previous.   Currently in Pain? No/denies                         [facility] Adult PT Treatment/Exercise - 10/19/15 0001    High Level Balance   High Level Balance Activities Turns;Figure 8  turns;Weight-shifting turns;Marching turns   High Level Balance Comments Pt requires sequencing cues for turning technique.  He tends to perform weightshifting turns better, but he carries over excessive weightshifting and marching into gait after turns.  Difficulty sequencing for carryover, needing frequent verbal cues.     After discussing tips to reduce freezing, practiced initial weigthshifting upon standing to initiate gait, practiced stopping/resetting in the case of a freezing episode, marching in place, as well as use of visual cues.  At counter-practiced lateral weigthshifting and ant/post weightshifting then lifting for improved turns.  Pt has difficulty sequencing marching turns and quarter turns.      Self Care:      PT Education - 10/18/15 0955    Education provided Yes   Education Details Tips to reduce freezing with gait and turns; practiced tips on handout; discussed POC and plans to renew 1x/wk for 4 weeks   Person(s) Educated Patient   Methods Explanation;Demonstration;Handout;Verbal cues   Comprehension Verbalized understanding;Returned demonstration;Verbal cues required;Need further instruction             PT Long Term Goals - 10/19/15 0748    PT LONG TERM GOAL #1   Title Pt will be independent with HEP for improved balance, gait, and functional mobility.  UPDATED/ONGOING GOAL TARGET 11/15/15   Time 4   Period [place]  Status On-going   PT LONG TERM GOAL #2   Title Pt will improve 5x sit<>stand to less than or equal to 12 sec for improved transfer effiemcy.   Time 4   Period Weeks   Status Achieved   PT LONG TERM GOAL #3   Title Pt will perform  sit<>stand transfers 8 of 10 reps from less than 18 inches without UE support independenlty no posterior lean    Time 4   Period Weeks   Status Achieved   PT LONG TERM GOAL #4   Title Pt will improve Dynamic Gait Index score to at least 21/24 for decreased fall risk.  UPDATED GOAL TARGET 11/15/15   Time 4   Period  Weeks   Status Revised   PT LONG TERM GOAL #5   Title Pt will verbalize understanding of techniques to reduce freezing with gait.  UPDATED/ONGOING GOAL TARGET 11/15/15   Time 4   Period Weeks   Status On-going   PT LONG TERM GOAL #6   Title Pt will verbalize understanding of local Parkinson's dsease resources.  UPDATED/ONGOING GOAL TARGET 11/15/14   Time 4   Period Weeks   Status On-going               Plan - 10/19/15 0749    Clinical Impression Statement Pt continues to demonstrate decreased ability for coordination of movements with weightshifting and turning practice in today's session.  While he has improved on some objective measures, he would continue to benefit from further skilled PT to address weightshifting, turning practice and gait/balance.  Pt does desire to decrease frequency to 1x/wk for the next 4 weeks. Please see renewal   Pt will benefit from skilled therapeutic intervention in order to improve on the following deficits Abnormal gait;Decreased balance;Decreased mobility;Decreased strength;Difficulty walking;Postural dysfunction   Rehab Potential Good   PT Frequency 1x / week   PT Duration 4 weeks  per renewal 10/18/15   PT Treatment/Interventions ADLs/Self Care Home Management;Therapeutic exercise;Therapeutic activities;Functional mobility training;Gait training;Balance training;Neuromuscular re-education;Patient/family education   PT Next Visit Plan Review tips on decreasing freezing with gait, turning and short distance gait practice with changes of directions.  Note updated goals/POC   PT Home Exercise Plan PWR! seated and standing.    Consulted and Agree with Plan of Care Patient        Problem List Patient Active Problem List   Diagnosis Date Noted  . 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 Acute urinary retention 11/23/2012  . BPH (benign prostatic hyperplasia) 11/23/2012    Emmer Lillibridge W. 10/19/2015, 7:56 AM  Frazier Butt., PT  [facility] [address] [place], [place], [address] Phone: [phone]   Fax:  [phone]  Name: Adam Cordova MRN: [id number] Date of Birth: [birth year]

## 2015-10-20 ENCOUNTER — Ambulatory Visit: Payer: PPO | Admitting: Physical Therapy

## 2015-10-24 ENCOUNTER — Ambulatory Visit: Payer: PPO | Admitting: Physical Therapy

## 2015-10-24 DIAGNOSIS — R2681 Unsteadiness on feet: Secondary | ICD-10-CM | POA: Diagnosis not present

## 2015-10-24 DIAGNOSIS — R258 Other abnormal involuntary movements: Secondary | ICD-10-CM

## 2015-10-24 DIAGNOSIS — R269 Unspecified abnormalities of gait and mobility: Secondary | ICD-10-CM

## 2015-10-24 NOTE — Therapy (Signed)
[facility] [address] [place], [place], [address] Phone: [phone]   Fax:  [phone]  Physical Therapy Treatment  Patient Details  Name: Adam Cordova MRN: [id number] Date of Birth: 10/29/1955 Referring Provider: Marisue Humble  Encounter Date: 10/24/2015      PT End of Session - 10/24/15 2228    Visit Number 11   Number of Visits 13   Date for PT Re-Evaluation 11/10/15   Authorization Type [employer]    PT Start Time 0940  Pt arrives late, on phone   PT Stop Time 1016   PT Time Calculation (min) 36 min   Activity Tolerance Patient tolerated treatment well   Behavior During Therapy [facility] for tasks assessed/performed      Past Medical History  Diagnosis Date  . BPH (benign prostatic hypertrophy) with urinary retention   . Parkinson disease [facility])     neurologist--  dr Richardean Chimera  in [place]  . Foley catheter in place   . UTI (urinary tract infection)     Past Surgical History  Procedure Laterality Date  . Transurethral resection of prostate N/A 11/23/2012    Procedure: TRANSURETHRAL RESECTION OF THE PROSTATE WITH GYRUS INSTRUMENTS;  Surgeon: Bernestine Amass, MD;  [facility];  Service: Urology;  Laterality: N/A;    There were no vitals filed for this visit.  Visit Diagnosis:  Abnormality of gait  Unsteadiness  Bradykinesia      Subjective Assessment - 10/24/15 0943    Subjective Pt arrives late and having difficulty with phone to call wife-feels he's a bit nervous.  His wife is concerned about driving.   Currently in Pain? No/denies           Gait: Gait activities around gym area, with directions to look find cones (8 total), pick up one cone at a time, then focus on turning to walk back to starting point.  This activity was practiced to reinforce turning and gait activities to reduce freezing from last visit.  With gait, pt has increased forward flexed  posture, looking at ground, decreased arm swing, and slowed gait.  Pt needs redirection to activity at hand multiple times.    Self Care: Discussed fatigue/stressors of upcoming move in relation to PD symptoms.  Disucussed medication management, driving issues, bringing wife to therapy for pt/caregiver education.                        PT Education - 10/24/15 2227    Education provided Yes   Education Details Reviewed tips to reduce freezing with gait and turns, discussed medications, fatigue/driving concerns and recommended bringing wife to therapy sessions for education   Person(s) Educated Patient   Methods Explanation;Demonstration   Comprehension Verbalized understanding;Need further instruction;Verbal cues required             PT Long Term Goals - 10/19/15 0748    PT LONG TERM GOAL #1   Title Pt will be independent with HEP for improved balance, gait, and functional mobility.  UPDATED/ONGOING GOAL TARGET 11/15/15   Time 4   Period Weeks   Status On-going   PT LONG TERM GOAL #2   Title Pt will improve 5x sit<>stand to less than or equal to 12 sec for improved transfer effiemcy.   Time 4   Period Weeks   Status Achieved   PT LONG TERM GOAL #3   Title Pt will perform  sit<>stand transfers 8  of 10 reps from less than 18 inches without UE support independenlty no posterior lean    Time 4   Period Weeks   Status Achieved   PT LONG TERM GOAL #4   Title Pt will improve Dynamic Gait Index score to at least 21/24 for decreased fall risk.  UPDATED GOAL TARGET 11/15/15   Time 4   Period Weeks   Status Revised   PT LONG TERM GOAL #5   Title Pt will verbalize understanding of techniques to reduce freezing with gait.  UPDATED/ONGOING GOAL TARGET 11/15/15   Time 4   Period Weeks   Status On-going   PT LONG TERM GOAL #6   Title Pt will verbalize understanding of local Parkinson's dsease resources.  UPDATED/ONGOING GOAL TARGET 11/15/14   Time 4   Period Weeks    Status On-going               Plan - 10/24/15 2229    Clinical Impression Statement Pt seems fatigued, more easily distractable during gait activities in PT session today.  He needs frequent redirection to gait/turning task and has a hard time sequencing task.  Discussed fatigue, stressors with upcoming move playing a part in pt's reported/noted motor fluctuations.  Pt will continue to benefit from further skilled PT to address balance, gait, safety.   Pt will benefit from skilled therapeutic intervention in order to improve on the following deficits Abnormal gait;Decreased balance;Decreased mobility;Decreased strength;Difficulty walking;Postural dysfunction   Rehab Potential Good   PT Frequency 1x / week   PT Duration 4 weeks  per renewal 10/18/15   PT Treatment/Interventions ADLs/Self Care Home Management;Therapeutic exercise;Therapeutic activities;Functional mobility training;Gait training;Balance training;Neuromuscular re-education;Patient/family education   PT Next Visit Plan Review tips on decreasing freezing with gait, turning and short distance gait practice with changes of directions.  Note updated goals/POC   PT Home Exercise Plan PWR! seated and standing.    Consulted and Agree with Plan of Care Patient        Problem List Patient Active Problem List   Diagnosis Date Noted  . 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 Acute urinary retention 11/23/2012  . BPH (benign prostatic hyperplasia) 11/23/2012    Adam Labella W. 10/24/2015, 10:31 PM  Frazier Butt., PT  [facility] [address] [place], [place], [address] Phone: [phone]   Fax:  [phone]  Name: Adam Cordova MRN: [id number] Date of Birth: [birth year]

## 2015-10-25 ENCOUNTER — Ambulatory Visit: Payer: PPO | Admitting: Physical Therapy

## 2015-10-27 ENCOUNTER — Ambulatory Visit: Payer: PPO | Admitting: Physical Therapy

## 2015-11-01 ENCOUNTER — Ambulatory Visit: Payer: PPO | Attending: Family Medicine | Admitting: Physical Therapy

## 2015-11-01 DIAGNOSIS — R2689 Other abnormalities of gait and mobility: Secondary | ICD-10-CM | POA: Diagnosis not present

## 2015-11-01 DIAGNOSIS — R2681 Unsteadiness on feet: Secondary | ICD-10-CM

## 2015-11-01 DIAGNOSIS — R269 Unspecified abnormalities of gait and mobility: Secondary | ICD-10-CM | POA: Diagnosis not present

## 2015-11-01 DIAGNOSIS — R258 Other abnormal involuntary movements: Secondary | ICD-10-CM | POA: Diagnosis not present

## 2015-11-01 NOTE — Therapy (Signed)
[place] [address] [place], [place], [address] Phone: [phone]   Fax:  [phone]  Physical Therapy Treatment  Patient Details  Name: Adam Cordova MRN: [id number] Date of Birth: 01/10/1956 Referring Provider: Marisue Humble  Encounter Date: 11/01/2015      PT End of Session - 11/01/15 1107    Visit Number 12   Number of Visits 13   Date for PT Re-Evaluation 11/10/15   Authorization Type [employer]    PT Start Time 1020   PT Stop Time 1102   PT Time Calculation (min) 42 min   Equipment Utilized During Treatment Gait belt   Activity Tolerance Patient tolerated treatment well   Behavior During Therapy [facility] for tasks assessed/performed      Past Medical History  Diagnosis Date  . BPH (benign prostatic hypertrophy) with urinary retention   . Parkinson disease [facility])     neurologist--  dr Richardean Chimera  in [place]  . Foley catheter in place   . UTI (urinary tract infection)     Past Surgical History  Procedure Laterality Date  . Transurethral resection of prostate N/A 11/23/2012    Procedure: TRANSURETHRAL RESECTION OF THE PROSTATE WITH GYRUS INSTRUMENTS;  Surgeon: Bernestine Amass, MD;  Location: [facility];  Service: Urology;  Laterality: N/A;    There were no vitals filed for this visit.  Visit Diagnosis:  Abnormality of gait  Unsteadiness      Subjective Assessment - 11/01/15 1025    Subjective Pt reports not being able to fully do exercises due to his wife being sick and moving this Friday to new home.  He really wants to work on balance.  He reports he will get back into doing exercises starting next week.   Currently in Pain? No/denies                         [facility] Adult PT Treatment/Exercise - 11/01/15 1055    High Level Balance   High Level Balance Comments In corner-heel/toe raises and wall bumps for hip/ankle strategy work x 20 reps; on rockerboard x 10  reps hip/ankle strategy with UE support of bars; discussed improved awareness of body positioning to grade movements for improved balance.  Alternating step taps at 6" step, then 12'" step x 10 reps each leg, then forward step up/up-down/down at 6" step with each leg leading x 10 reps, then single limb step ups x 10 reps each leg with UE support, with cues for full foot placement.  Forward step up/up-down/down at 8" curb step no UE support x 10 reps with min assistance and cues for widened BOS.  Reiterated deliberateness of mov't pattern/step placement           [facility]) - 11/01/15 1028    PWR! exercises Moves in standing   PWR! Up x 20   PWR! Rock x 20   PWR! Twist x 20    PWR Step x 20  forward, back, and side step and weigthshift w/ UE support   Comments Cues for deliberate stop/starts of movement, as well as large amplitude of movement pattern                  PT Long Term Goals - 10/19/15 0748    PT LONG TERM GOAL #1   Title Pt will be independent with HEP for improved balance, gait, and functional mobility.  UPDATED/ONGOING GOAL TARGET 11/15/15  Time 4   Period Weeks   Status On-going   PT LONG TERM GOAL #2   Title Pt will improve 5x sit<>stand to less than or equal to 12 sec for improved transfer effiemcy.   Time 4   Period Weeks   Status Achieved   PT LONG TERM GOAL #3   Title Pt will perform  sit<>stand transfers 8 of 10 reps from less than 18 inches without UE support independenlty no posterior lean    Time 4   Period Weeks   Status Achieved   PT LONG TERM GOAL #4   Title Pt will improve Dynamic Gait Index score to at least 21/24 for decreased fall risk.  UPDATED GOAL TARGET 11/15/15   Time 4   Period Weeks   Status Revised   PT LONG TERM GOAL #5   Title Pt will verbalize understanding of techniques to reduce freezing with gait.  UPDATED/ONGOING GOAL TARGET 11/15/15   Time 4   Period Weeks   Status On-going   PT LONG TERM GOAL #6   Title Pt will  verbalize understanding of local Parkinson's dsease resources.  UPDATED/ONGOING GOAL TARGET 11/15/14   Time 4   Period Weeks   Status On-going               Plan - 11/01/15 1107    Clinical Impression Statement Pt appears more on task during PT exercises today.  He does need frequent cueing for widened BOS and for deliberate foot placement on steps and with step/weigthshift exercises (to avoid posterior loss of balance).  Pt has difficulty on rockerboard in anterior direction.  Pts' ability to perform exercises at home has been limited due to upcoming move on Friday of this week.  Pt wil continue to benefit from further skilled PT to address balance, gait and posture/safety.   Pt will benefit from skilled therapeutic intervention in order to improve on the following deficits Abnormal gait;Decreased balance;Decreased mobility;Decreased strength;Difficulty walking;Postural dysfunction   Rehab Potential Good   PT Frequency 1x / week   PT Duration 4 weeks  per renewal 10/18/15   PT Treatment/Interventions ADLs/Self Care Home Management;Therapeutic exercise;Therapeutic activities;Functional mobility training;Gait training;Balance training;Neuromuscular re-education;Patient/family education   PT Next Visit Plan Review tips on decreasing freezing with gait, turning and short distance gait practice with changes of directions.  Stepping activities; hip/ankle strategy work   PT Home Exercise Plan PWR! seated and standing.    Consulted and Agree with Plan of Care Patient     Check goals next week-discuss POC-   Problem List Patient Active Problem List   Diagnosis Date Noted  . 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 Acute urinary retention 11/23/2012  . BPH (benign prostatic hyperplasia) 11/23/2012    Kuper Rennels W. 11/01/2015, 1:25 PM  Frazier Butt., PT  [place] [address] [place], [place], [address] Phone: [phone]   Fax:   [phone]  Name: Adam Cordova MRN: [id number] Date of Birth: Apr 13, 1956

## 2015-11-06 DIAGNOSIS — D1801 Hemangioma of skin and subcutaneous tissue: Secondary | ICD-10-CM | POA: Diagnosis not present

## 2015-11-06 DIAGNOSIS — D225 Melanocytic nevi of trunk: Secondary | ICD-10-CM | POA: Diagnosis not present

## 2015-11-06 DIAGNOSIS — L570 Actinic keratosis: Secondary | ICD-10-CM | POA: Diagnosis not present

## 2015-11-06 DIAGNOSIS — C44519 Basal cell carcinoma of skin of other part of trunk: Secondary | ICD-10-CM | POA: Diagnosis not present

## 2015-11-06 DIAGNOSIS — L821 Other seborrheic keratosis: Secondary | ICD-10-CM | POA: Diagnosis not present

## 2015-11-06 DIAGNOSIS — D485 Neoplasm of uncertain behavior of skin: Secondary | ICD-10-CM | POA: Diagnosis not present

## 2015-11-06 DIAGNOSIS — L814 Other melanin hyperpigmentation: Secondary | ICD-10-CM | POA: Diagnosis not present

## 2015-11-08 ENCOUNTER — Ambulatory Visit: Payer: PPO | Admitting: Physical Therapy

## 2015-11-08 DIAGNOSIS — R269 Unspecified abnormalities of gait and mobility: Secondary | ICD-10-CM | POA: Diagnosis not present

## 2015-11-08 DIAGNOSIS — R258 Other abnormal involuntary movements: Secondary | ICD-10-CM

## 2015-11-08 DIAGNOSIS — R2681 Unsteadiness on feet: Secondary | ICD-10-CM

## 2015-11-09 NOTE — Therapy (Signed)
[place] [address] [place], [place], [address] Phone: [phone]   Fax:  [phone]  Physical Therapy Treatment  Patient Details  Name: Adam Cordova MRN: [id number] Date of Birth: May 06, 1956 Referring Provider: Marisue Humble  Encounter Date: 11/08/2015      PT End of Session - 11/09/15 1416    Visit Number 13   Number of Visits 13   Date for PT Re-Evaluation 11/10/15   Authorization Type [employer]    PT Start Time 0934   PT Stop Time 1015   PT Time Calculation (min) 41 min   Activity Tolerance Patient tolerated treatment well   Behavior During Therapy [facility] for tasks assessed/performed      Past Medical History  Diagnosis Date  . BPH (benign prostatic hypertrophy) with urinary retention   . Parkinson disease [facility])     neurologist--  dr Richardean Chimera  in [place]  . Foley catheter in place   . UTI (urinary tract infection)     Past Surgical History  Procedure Laterality Date  . Transurethral resection of prostate N/A 11/23/2012    Procedure: TRANSURETHRAL RESECTION OF THE PROSTATE WITH GYRUS INSTRUMENTS;  Surgeon: Bernestine Amass, MD;  Location: [facility];  Service: Urology;  Laterality: N/A;    There were no vitals filed for this visit.  Visit Diagnosis:  Abnormality of gait  Unsteadiness  Bradykinesia      Subjective Assessment - 11/08/15 0937    Subjective Moved into new home within the past week; no falls, only one stumble trying to carry bos up the steps, but was able to catch himself before falling.   Patient Stated Goals Pt's goal for therapy is to continue (from HHPT) to improve walking and balance.   Currently in Pain? No/denies            [facility] PT Assessment - 11/09/15 1413    Transfers   Transfers Sit to Stand;Stand to Sit   Sit to Stand 7: Independent;Without upper extremity assist;From chair/3-in-1   Five time sit to stand comments  15.44   Stand to Sit  7: Independent;Without upper extremity assist;To chair/3-in-1   Ambulation/Gait   Ambulation/Gait Yes   Ambulation/Gait Assistance 5: Supervision   Ambulation Distance (Feet) 350 Feet   Assistive device None   Gait Pattern Step-through pattern;Decreased arm swing - right;Decreased arm swing - left;Decreased step length - right;Decreased step length - left;Shuffle;Decreased trunk rotation;Narrow base of support;Poor foot clearance - left;Poor foot clearance - right;Trunk flexed   Gait velocity 11.9 = 2.76 ft/sec   Dynamic Gait Index   Level Surface Mild Impairment   Change in Gait Speed Moderate Impairment   Gait with Horizontal Head Turns Mild Impairment   Gait with Vertical Head Turns Mild Impairment   Gait and Pivot Turn Normal   Step Over Obstacle Mild Impairment   Step Around Obstacles Severe Impairment   Steps Mild Impairment   Total Score 14   DGI comment: Scores <19/24 indicate increased fall risk; previous score of DGI was 15/24   Timed Up and Go Test   Normal TUG (seconds) 12.95   Cognitive TUG (seconds) --  Pt unable to complete task as directed-no counting                     [facility] Adult PT Treatment/Exercise - 11/09/15 1412    Transfers   Transfers Sit to Stand;Stand to Sit   Sit to Stand 7: Independent;Without upper  extremity assist;From chair/3-in-1   Five time sit to stand comments  15.44   Stand to Sit 7: Independent;Without upper extremity assist;To chair/3-in-1   Ambulation/Gait   Ambulation/Gait Yes   Ambulation/Gait Assistance 5: Supervision   Ambulation Distance (Feet) 350 Feet   Assistive device None   Gait Pattern Step-through pattern;Decreased arm swing - right;Decreased arm swing - left;Decreased step length - right;Decreased step length - left;Shuffle;Decreased trunk rotation;Narrow base of support;Poor foot clearance - left;Poor foot clearance - right;Trunk flexed   Ambulation Surface Level;Indoor   Gait velocity 11.9 = 2.76 ft/sec         Self Care:  Discussed tips to reduce freezing-pt able to verbalize only weightshifting as technique to reduce freezing.  Discussed pt's stressors of move, inquired about medication management with respect to PD meds-mentioned to patient his difficulty with dual tasking (unable to complete TUG cognitive as directed) with recent history of difficulty dual tasking and sequencing novel tasks in therapy sessions.  Pt does not feel this is limiting him at this time, and upon discussion from therapist regarding physical therapy to possibly address cognition, pt declines.  Discussed again importance of HEP and carryover of strategies of large amplitude movement patterns for home.  Discussed pt's declining scores and PT agreed with pt to try additional 4 weeks of therapy at 2x/wk with targeted instruction for HEP, posture, transfers, and strategies for gait/balance at home, with specific request of PT that wife attend at least some sessions for improved carryover.        PT Education - 11/09/15 1414    Education provided Yes   Education Details Discussed plan of care, results of goal check (including functional scores declining); discussed frankly need to either have wife come to sessions for improved carryover for home or discharge at this time with plans for community fitness.  Provided pt information on [employer] PD cycling class, [employer], and Parkinson's Power Hour.  Pt decides he wants to continue PT back up at 2x/wk frequency.   Person(s) Educated Patient   Methods Explanation;Handout   Comprehension Verbalized understanding             PT Long Term Goals - 11/08/15 0939    PT LONG TERM GOAL #1   Title Pt will be independent with HEP for improved balance, gait, and functional mobility.  UPDATED/ONGOING GOAL TARGET 11/15/15   Baseline Pt reports inconsistent HEP performance due to the recent.   Time 4   Period Weeks   Status Partially Met   PT LONG TERM GOAL #2   Title  Pt will improve 5x sit<>stand to less than or equal to 12 sec for improved transfer effiemcy.   Time 4   Period Weeks   Status Achieved   PT LONG TERM GOAL #3   Title Pt will perform  sit<>stand transfers 8 of 10 reps from less than 18 inches without UE support independenlty no posterior lean    Time 4   Period Weeks   Status Achieved   PT LONG TERM GOAL #4   Title Pt will improve Dynamic Gait Index score to at least 21/24 for decreased fall risk.  UPDATED GOAL TARGET 11/15/15   Time 4   Period Weeks   Status Revised   PT LONG TERM GOAL #5   Title Pt will verbalize understanding of techniques to reduce freezing with gait.  UPDATED/ONGOING GOAL TARGET 11/15/15   Baseline Pt able to verbalize weightshifting as a technique for reducing freezing  with gait.    Time 4   Period Weeks   Status Partially Met   PT LONG TERM GOAL #6   Title Pt will verbalize understanding of local Parkinson's dsease resources.  UPDATED/ONGOING GOAL TARGET 11/15/14   Time 4   Period Weeks   Status On-going               Plan - 11/09/15 1418    Clinical Impression Statement Pt has partially met LTG #1 and 5.  LTG #4 not met for DGI score (decreased from 15/24 to 14/24), with LTG #6 met.  Pt has reported inconsistency of HEP performance due to recent move.  PT discussed with pt decline in some of his functional measures as well as noted difficulty with word finding, finishing sentences and dual tasking (TUG cognitive unable to be completed as directed), and benefit of potential speech therapy to address cognition.  Pt does not appear interested in speech therapy at this time.  PT does recommend that if patient continues therapy for additional POC that his wife attend sessions for improved carryover of tasks, strategies, and movement patterns.  Pt wants to renew PT for additional 4 weeks-formal renewal to be completed next visit.   Pt will benefit from skilled therapeutic intervention in order to improve on the  following deficits Abnormal gait;Decreased balance;Decreased mobility;Decreased strength;Difficulty walking;Postural dysfunction   Rehab Potential Good   PT Frequency 1x / week   PT Duration 4 weeks  per renewal 10/18/15   PT Treatment/Interventions ADLs/Self Care Home Management;Therapeutic exercise;Therapeutic activities;Functional mobility training;Gait training;Balance training;Neuromuscular re-education;Patient/family education   PT Next Visit Plan  turning and short distance gait practice with changes of directions.  Stepping activities; hip/ankle strategy work; compliant surfaces   PT Home Exercise Plan PWR! seated and standing.    Consulted and Agree with Plan of Care Patient     Plan:  Complete renewal at next visit.    Problem List Patient Active Problem List   Diagnosis Date Noted  . 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 Acute urinary retention 11/23/2012  . BPH (benign prostatic hyperplasia) 11/23/2012    MARRIOTT,AMY W. 11/09/2015, 2:24 PM  Frazier Butt., PT [place] [address] [place], [place], [address] Phone: [phone]   Fax:  [phone]  Name: Adam Cordova MRN: [id number] Date of Birth: [birth year]

## 2015-11-15 ENCOUNTER — Ambulatory Visit: Payer: PPO | Admitting: Physical Therapy

## 2015-11-15 DIAGNOSIS — R2681 Unsteadiness on feet: Secondary | ICD-10-CM

## 2015-11-15 DIAGNOSIS — R269 Unspecified abnormalities of gait and mobility: Secondary | ICD-10-CM

## 2015-11-15 DIAGNOSIS — R258 Other abnormal involuntary movements: Secondary | ICD-10-CM

## 2015-11-15 NOTE — Therapy (Signed)
[place] [address] [place], [place], [address] Phone: [phone]   Fax:  [phone]  Physical Therapy Treatment  Patient Details  Name: NAVARRO BRANCATO MRN: [id number] Date of Birth: 1956-08-05 Referring Provider: Marisue Humble  Encounter Date: 11/15/2015      PT End of Session - 11/15/15 2155    Visit Number 14   Number of Visits 21   Date for PT Re-Evaluation 01/14/16   Authorization Type [employer]    PT Start Time 1018   PT Stop Time 1100   PT Time Calculation (min) 42 min   Activity Tolerance Patient tolerated treatment well   Behavior During Therapy [facility] for tasks assessed/performed      Past Medical History  Diagnosis Date  . BPH (benign prostatic hypertrophy) with urinary retention   . Parkinson disease [facility])     neurologist--  dr Richardean Chimera  in [place]  . Foley catheter in place   . UTI (urinary tract infection)     Past Surgical History  Procedure Laterality Date  . Transurethral resection of prostate N/A 11/23/2012    Procedure: TRANSURETHRAL RESECTION OF THE PROSTATE WITH GYRUS INSTRUMENTS;  Surgeon: Bernestine Amass, MD;  Location: [facility];  Service: Urology;  Laterality: N/A;    There were no vitals filed for this visit.  Visit Diagnosis:  Abnormality of gait  Unsteadiness  Bradykinesia      Subjective Assessment - 11/15/15 1021    Subjective No falls in the last week.  Home is slowing getting unpacked-looking more lived in.  Little nervous about the steps in our new home.    Patient Stated Goals Pt's goal for therapy is to continue (from HHPT) to improve walking and balance.   Currently in Pain? No/denies                         [facility] Adult PT Treatment/Exercise - 11/15/15 1023    Ambulation/Gait   Ambulation/Gait Yes   Stairs Yes   Stairs Assistance 6: Modified independent (Device/Increase time)   Stair Management Technique One rail  Left;Alternating pattern   Number of Stairs 4  10 reps, cues for foot placement   Pre-Gait Activities Forward step ups at 6" step with bilat UE support>1 UE support, 2 sets of 10 reps   Gait Comments With stair negotiation, cues for full foot placement with ascending steps and full foot clearance from previous step for foot placement upon descending steps.   High Level Balance   High Level Balance Comments Balance activities in parallel bars using rocker board in anterior/posterior position, for hip/ankle strategy work, followed by balancing on rockerboard with UE lifts, then arm swing, with pt experiencing posterior lean, needing hands to assist regaining balance.  Therapist provides min guard/min assistance and cues to try to use hip/ankle strategy for regaining balance.  On foam balance beam, pt performs marching in place x 10 reps, forward kicks x 10, forward step taps x 10 reps for improved dynamic balance, with UE support and supervision.  Pt tends to stand with narrow BOS, with therapist providing cues for widened BOS.  Standing on foam balance beam with head turns and with head nods x 5 reps each, with UE support.                PT Education - 11/15/15 2155    Education provided Yes   Education Details Safety with stair negotiation  Person(s) Educated Patient   Methods Explanation;Demonstration;Handout   Comprehension Verbalized understanding;Returned demonstration;Verbal cues required             PT Long Term Goals - 11/15/15 2157    PT LONG TERM GOAL #1   Title Pt will be independent with HEP for improved balance, gait, and functional mobility.  UPDATED/ONGOING GOAL TARGET 12/16/15   Time 4   Period Weeks   Status On-going   PT LONG TERM GOAL #2   Title Pt will improve 5x sit<>stand to less than or equal to 12 sec for improved transfer effiemcy.   Time 4   Period Weeks   Status Achieved   PT LONG TERM GOAL #3   Title Pt will perform  sit<>stand transfers 8 of 10  reps from less than 18 inches without UE support independenlty no posterior lean    Time 4   Period Weeks   Status Achieved   PT LONG TERM GOAL #4   Title Pt will improve Dynamic Gait Index score to at least 19/24 for decreased fall risk.  UPDATED GOAL TARGET 12/16/15   Time 4   Period Weeks   Status Revised   PT LONG TERM GOAL #5   Title Pt will verbalize understanding of techniques to reduce freezing with gait.  UPDATED/ONGOING GOAL TARGET 11/15/15   Baseline Pt able to verbalize weightshifting as a technique for reducing freezing with gait.    Time 4   Period Weeks   Status On-going   PT LONG TERM GOAL #6   Title Pt will verbalize understanding of local Parkinson's dsease resources.  UPDATED/ONGOING GOAL TARGET 12/16/14   Time 4   Period Weeks   Status On-going               Plan - 11/15/15 2159    Clinical Impression Statement LTG #1, 2, 4 remain appropriate to extend and continue to work towards dynamic gait and balance for improved functional mobility and decreased fall risk.  Will attempt to further discuss possibility for speech therapy with pt to address cognition.  Wife not present for session today.  Renewal sent to physician.   Pt will benefit from skilled therapeutic intervention in order to improve on the following deficits Abnormal gait;Decreased balance;Decreased mobility;Decreased strength;Difficulty walking;Postural dysfunction   Rehab Potential Good   PT Frequency 2x / week   PT Duration 4 weeks  per renewal 11/15/15   PT Treatment/Interventions ADLs/Self Care Home Management;Therapeutic exercise;Therapeutic activities;Functional mobility training;Gait training;Balance training;Neuromuscular re-education;Patient/family education   PT Next Visit Plan  turning and short distance gait practice with changes of directions.  Stepping activities; hip/ankle strategy work; compliant surfaces   PT Home Exercise Plan PWR! seated and standing.    Consulted and Agree with  Plan of Care Patient        Problem List Patient Active Problem List   Diagnosis Date Noted  . 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 Acute urinary retention 11/23/2012  . BPH (benign prostatic hyperplasia) 11/23/2012    Galileo Colello W. 11/15/2015, 10:02 PM  Frazier Butt., PT  [place] [address] [place], [place], [address] Phone: [phone]   Fax:  [phone]  Name: RION SLAYDEN MRN: [id number] Date of Birth: 1956-07-13

## 2015-11-15 NOTE — Patient Instructions (Signed)
Stair Negotiation: -Make sure to hold onto the rail as often as possible -When going up the steps, fully and deliberately place the foot on the step -When going down the steps, kick forward to clear the previous step  -When carrying items going up and down the steps, you need to try to hold the item in front of you so you can see your foot on the step

## 2015-11-16 ENCOUNTER — Ambulatory Visit: Payer: PPO | Admitting: Physical Therapy

## 2015-11-16 DIAGNOSIS — R269 Unspecified abnormalities of gait and mobility: Secondary | ICD-10-CM

## 2015-11-16 NOTE — Therapy (Signed)
[facility] [address] [place], [place], [address] Phone: [phone]   Fax:  [phone]  Physical Therapy Treatment  Patient Details  Name: Adam Cordova MRN: [id number] Date of Birth: Jan 08, 1956 Referring Provider: Marisue Humble  Encounter Date: 11/16/2015      PT End of Session - 11/16/15 0939    Visit Number 14   Number of Visits 21   Date for PT Re-Evaluation 01/14/16   Authorization Type [employer]    PT Start Time 0850   PT Stop Time 0934   PT Time Calculation (min) 44 min   Equipment Utilized During Treatment Gait belt   Activity Tolerance Patient tolerated treatment well   Behavior During Therapy [facility] for tasks assessed/performed      Past Medical History  Diagnosis Date  . BPH (benign prostatic hypertrophy) with urinary retention   . Parkinson disease [facility])     neurologist--  dr Richardean Chimera  in [place]  . Foley catheter in place   . UTI (urinary tract infection)     Past Surgical History  Procedure Laterality Date  . Transurethral resection of prostate N/A 11/23/2012    Procedure: TRANSURETHRAL RESECTION OF THE PROSTATE WITH GYRUS INSTRUMENTS;  Surgeon: Bernestine Amass, MD;  Location: [facility];  Service: Urology;  Laterality: N/A;    There were no vitals filed for this visit.  Visit Diagnosis:  Abnormality of gait      Subjective Assessment - 11/16/15 0855    Subjective Denies falls or changes.     Patient Stated Goals Pt's goal for therapy is to continue (from HHPT) to improve walking and balance.   Currently in Pain? No/denies            [facility] Adult PT Treatment/Exercise - 11/16/15 0857    Knee/Hip Exercises: Aerobic   Stepper Scifit level 2.5-3.0 all 4 extremities x 8 minutes with rpm>100      In parallel bars for balance activities:on blue foam beam with head turns, nods and tossing ball.  Tends to have LOB posteriorly and needs min assist to recover at  times.  Intermittent UE support.  On rockerboard anterior/posterior and lateral for head turns and nods as well as rocking board to end of range.  Bosu on black platform with UE assist then mini-squats x 10 with UE assist.  Standing on foam and taps to 8" step then standing on foam and stepping onto/off of 6" step with 1 UE assist.  Step ups onto/off 6" step and 1 UE assist and step down off/onto 6" step with 1 UE assist.  Gait-Up/down stairs with 1 UE x 16 steps.  Decreased control coming down step.  Worked on turns with gait in tight spaces moving cones from one surface to the other.  Decreased BOS with gait and turns.  No LOB but did get feet crossed midline on several occasions.          PT Long Term Goals - 11/15/15 2157    PT LONG TERM GOAL #1   Title Pt will be independent with HEP for improved balance, gait, and functional mobility.  UPDATED/ONGOING GOAL TARGET 12/16/15   Time 4   Period Weeks   Status On-going   PT LONG TERM GOAL #2   Title Pt will improve 5x sit<>stand to less than or equal to 12 sec for improved transfer effiemcy.   Time 4   Period Weeks   Status Achieved   PT LONG TERM  GOAL #3   Title Pt will perform  sit<>stand transfers 8 of 10 reps from less than 18 inches without UE support independenlty no posterior lean    Time 4   Period Weeks   Status Achieved   PT LONG TERM GOAL #4   Title Pt will improve Dynamic Gait Index score to at least 19/24 for decreased fall risk.  UPDATED GOAL TARGET 12/16/15   Time 4   Period Weeks   Status Revised   PT LONG TERM GOAL #5   Title Pt will verbalize understanding of techniques to reduce freezing with gait.  UPDATED/ONGOING GOAL TARGET 11/15/15   Baseline Pt able to verbalize weightshifting as a technique for reducing freezing with gait.    Time 4   Period Weeks   Status On-going   PT LONG TERM GOAL #6   Title Pt will verbalize understanding of local Parkinson's dsease resources.  UPDATED/ONGOING GOAL TARGET 12/16/14    Time 4   Period Weeks   Status On-going               Plan - 11/16/15 0940    Clinical Impression Statement Pt needed moderate cues at times to follow instruction for activities during session.  Tends to have LOB backwards on foam or rockerboard.  Wife not present.  Continue PT per POC.   Pt will benefit from skilled therapeutic intervention in order to improve on the following deficits Abnormal gait;Decreased balance;Decreased mobility;Decreased strength;Difficulty walking;Postural dysfunction   Rehab Potential Good   PT Frequency 2x / week   PT Duration 4 weeks  per renewal 11/15/15   PT Treatment/Interventions ADLs/Self Care Home Management;Therapeutic exercise;Therapeutic activities;Functional mobility training;Gait training;Balance training;Neuromuscular re-education;Patient/family education   PT Next Visit Plan  forward/backward stepping on compliant and non compliant surface.  turning and short distance gait practice with changes of directions.  Stepping activities; hip/ankle strategy work; compliant surfaces   PT Home Exercise Plan PWR! seated and standing.    Consulted and Agree with Plan of Care Patient        Problem List Patient Active Problem List   Diagnosis Date Noted  . 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 Acute urinary retention 11/23/2012  . BPH (benign prostatic hyperplasia) 11/23/2012    Narda Bonds 11/16/2015, 9:51 AM  [facility] [address] [place], [place], [address] Phone: [phone]   Fax:  [phone]  Name: LATHANIEL BONFIELD MRN: [id number] Date of Birth: December 02, 1955    Narda Bonds, [place] 11/16/2015 9:51 AM Phone: [phone] Fax: [phone]

## 2015-11-21 ENCOUNTER — Ambulatory Visit: Payer: PPO | Admitting: Physical Therapy

## 2015-11-22 ENCOUNTER — Ambulatory Visit: Payer: PPO | Admitting: Physical Therapy

## 2015-11-22 DIAGNOSIS — R269 Unspecified abnormalities of gait and mobility: Secondary | ICD-10-CM

## 2015-11-22 DIAGNOSIS — R2681 Unsteadiness on feet: Secondary | ICD-10-CM

## 2015-11-23 NOTE — Therapy (Signed)
[facility] [address] [place], [place], [address] Phone: [phone]   Fax:  [phone]  Physical Therapy Treatment  Patient Details  Name: Adam Cordova MRN: [id number] Date of Birth: Apr 26, 1956 Referring Provider: Marisue Humble  Encounter Date: 11/22/2015      PT End of Session - 11/23/15 0842    Visit Number 15   Number of Visits 21   Date for PT Re-Evaluation 01/14/16   Authorization Type [employer]    PT Start Time 0936   PT Stop Time 1015   PT Time Calculation (min) 39 min   Activity Tolerance Patient tolerated treatment well   Behavior During Therapy [facility] for tasks assessed/performed      Past Medical History  Diagnosis Date  . BPH (benign prostatic hypertrophy) with urinary retention   . Parkinson disease [facility])     neurologist--  dr Richardean Chimera  in [place]  . Foley catheter in place   . UTI (urinary tract infection)     Past Surgical History  Procedure Laterality Date  . Transurethral resection of prostate N/A 11/23/2012    Procedure: TRANSURETHRAL RESECTION OF THE PROSTATE WITH GYRUS INSTRUMENTS;  Surgeon: Bernestine Amass, MD;  Location: [facility];  Service: Urology;  Laterality: N/A;    There were no vitals filed for this visit.  Visit Diagnosis:  Abnormality of gait  Unsteadiness      Subjective Assessment - 11/22/15 0939    Subjective No falls or changes since last visit per pt report.  Missed appt yesterday-just forgot about the appt.   Patient Stated Goals Pt's goal for therapy is to continue (from HHPT) to improve walking and balance.   Currently in Pain? No/denies                         [facility] Adult PT Treatment/Exercise - 11/22/15 0940    Ambulation/Gait   Ambulation/Gait Yes   Ambulation/Gait Assistance 5: Supervision   Ambulation/Gait Assistance Details Gait with conversational tasks-pt slows gait with conversation, but no LOB  Cues  provided for posture   Ambulation Distance (Feet) 400 Feet   Assistive device None   Gait Pattern Step-through pattern;Decreased arm swing - right;Decreased arm swing - left;Decreased step length - right;Decreased step length - left;Shuffle;Decreased trunk rotation;Narrow base of support;Poor foot clearance - left;Poor foot clearance - right;Trunk flexed   Stairs Yes   Stairs Assistance 6: Modified independent (Device/Increase time)   Stair Management Technique One rail Left;Alternating pattern   Number of Stairs 4  8 reps with cues for deliberate foot placement   Gait Comments Compliant surface forward/back walking on red mat, multiple reps with min guard assistance, with cues for slowed pace of backwards gait.   High Level Balance   High Level Balance Activities Backward walking  Forward/back walk in parallel bars 5 reps   High Level Balance Comments At parallel bars (simulating counter)-backward step and rock 2 sets x 10 reps with bilateral UE support, for improved step strategy in posterior direction; combined forward/back step and weigthshift 10 reps each leg, with cues for technique and sequence, especially in posterior direction.  Hip and ankle strategy work on balance beam in anterior/posterior direction with progressively less UE support/increased dynamic movement with UEs with close supervision, no LOB.  Lateral wegithshfiting on rockerboard for improved lateral hip/ankle strategy.  In parallel bars, on foam surface-marching in place, forward kicks, forward step taps, with UE support and frequent  cues for widened BOS.                     PT Long Term Goals - 11/15/15 2157    PT LONG TERM GOAL #1   Title Pt will be independent with HEP for improved balance, gait, and functional mobility.  UPDATED/ONGOING GOAL TARGET 12/16/15   Time 4   Period Weeks   Status On-going   PT LONG TERM GOAL #2   Title Pt will improve 5x sit<>stand to less than or equal to 12 sec for improved  transfer effiemcy.   Time 4   Period Weeks   Status Achieved   PT LONG TERM GOAL #3   Title Pt will perform  sit<>stand transfers 8 of 10 reps from less than 18 inches without UE support independenlty no posterior lean    Time 4   Period Weeks   Status Achieved   PT LONG TERM GOAL #4   Title Pt will improve Dynamic Gait Index score to at least 19/24 for decreased fall risk.  UPDATED GOAL TARGET 12/16/15   Time 4   Period Weeks   Status Revised   PT LONG TERM GOAL #5   Title Pt will verbalize understanding of techniques to reduce freezing with gait.  UPDATED/ONGOING GOAL TARGET 11/15/15   Baseline Pt able to verbalize weightshifting as a technique for reducing freezing with gait.    Time 4   Period Weeks   Status On-going   PT LONG TERM GOAL #6   Title Pt will verbalize understanding of local Parkinson's dsease resources.  UPDATED/ONGOING GOAL TARGET 12/16/14   Time 4   Period Weeks   Status On-going               Plan - 11/23/15 [id number]    Clinical Impression Statement Pt appears to be improved with balance recovery/decreased posterior LOB on rockerboard activities, but does need frequent cues for paced, deliberate steps in posterior direction to prevent LOB.  Pt will continue to benefit from further skilled physical therapy to address balance, gait, posture.   Pt will benefit from skilled therapeutic intervention in order to improve on the following deficits Abnormal gait;Decreased balance;Decreased mobility;Decreased strength;Difficulty walking;Postural dysfunction   Rehab Potential Good   PT Frequency 2x / week   PT Duration 4 weeks  per renewal 11/15/15   PT Treatment/Interventions ADLs/Self Care Home Management;Therapeutic exercise;Therapeutic activities;Functional mobility training;Gait training;Balance training;Neuromuscular re-education;Patient/family education   PT Next Visit Plan  forward/backward stepping on compliant and non compliant surface.  turning and short  distance gait practice with changes of directions.  Stepping activities; hip/ankle strategy work; compliant surfaces   PT Home Exercise Plan PWR! seated and standing.    Consulted and Agree with Plan of Care Patient        Problem List Patient Active Problem List   Diagnosis Date Noted  . 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 Acute urinary retention 11/23/2012  . BPH (benign prostatic hyperplasia) 11/23/2012    MARRIOTT,AMY W. 11/23/2015, 10:41 AM  Frazier Butt., PT  [facility] [address] [place], [place], [address] Phone: [phone]   Fax:  [phone]  Name: GEDALYA DECHRISTOPHER MRN: [id number] Date of Birth: 03-05-1956

## 2015-11-28 ENCOUNTER — Ambulatory Visit: Payer: PPO | Admitting: Physical Therapy

## 2015-11-29 ENCOUNTER — Ambulatory Visit: Payer: PPO | Admitting: Physical Therapy

## 2015-11-29 DIAGNOSIS — R269 Unspecified abnormalities of gait and mobility: Secondary | ICD-10-CM | POA: Diagnosis not present

## 2015-11-29 DIAGNOSIS — R2689 Other abnormalities of gait and mobility: Secondary | ICD-10-CM

## 2015-11-30 NOTE — Therapy (Signed)
[facility] [address] [place], [place], [address] Phone: [phone]   Fax:  [phone]  Physical Therapy Treatment  Patient Details  Name: Adam Cordova MRN: [id number] Date of Birth: 07-29-1956 Referring Provider: Marisue Humble  Encounter Date: 11/29/2015      PT End of Session - 11/30/15 1524    Visit Number 16   Number of Visits 21   Date for PT Re-Evaluation 01/14/16   Authorization Type [employer]    PT Start Time 0934   PT Stop Time 1014   PT Time Calculation (min) 40 min   Equipment Utilized During Treatment Gait belt   Activity Tolerance Patient tolerated treatment well   Behavior During Therapy [facility] for tasks assessed/performed      Past Medical History  Diagnosis Date  . BPH (benign prostatic hypertrophy) with urinary retention   . Parkinson disease [facility])     neurologist--  dr Richardean Chimera  in [place]  . Foley catheter in place   . UTI (urinary tract infection)     Past Surgical History  Procedure Laterality Date  . Transurethral resection of prostate N/A 11/23/2012    Procedure: TRANSURETHRAL RESECTION OF THE PROSTATE WITH GYRUS INSTRUMENTS;  Surgeon: Bernestine Amass, MD;  Location: [facility];  Service: Urology;  Laterality: N/A;    There were no vitals filed for this visit.  Visit Diagnosis:  Other abnormalities of gait and mobility      Subjective Assessment - 11/29/15 0937    Subjective Didn't come yesterday because having plumber out to house at last minute.  No falls, no changes.   Patient Stated Goals Pt's goal for therapy is to continue (from HHPT) to improve walking and balance.   Currently in Pain? No/denies                         [facility] Adult PT Treatment/Exercise - 11/29/15 0939    Ambulation/Gait   Ambulation/Gait Yes   Ambulation/Gait Assistance 5: Supervision   Ambulation/Gait Assistance Details Needs frequent cues for improved foot  clearance-pt able to do this momentarily, but then reverts back to decreased foot clearance.   Ambulation Distance (Feet) 400 Feet  1000 ft on outdoor surfaces   Assistive device None   Gait Pattern Step-through pattern;Decreased arm swing - right;Decreased arm swing - left;Decreased step length - right;Decreased step length - left;Shuffle;Decreased trunk rotation;Narrow base of support;Poor foot clearance - left;Poor foot clearance - right;Trunk flexed  Increased foot scuffing episodes today   High Level Balance   High Level Balance Comments In parallel bars:  Rockerboard anterior/posterior weigthshifting for hip/ankle strategy work, with min guard assistance; on compliant surface:  marching in place x 10, alternating forward kicks x 10, then alternating forward step taps x 10, back step taps x 10, side step taps x 10, then forward/back step taps x 10, then side to side step taps x 10, all on compliant mat surface with UE support with min guard/supervision     Stepping strategy balance activity, at mirror with cues for varied direction step and weigthshift-forward/back/side/R or L, multiple reps with min guard assistance.  Pt needs cues for widened BOS and has 2 LOB, with crossover step for balance recovery.           PT Education - 11/30/15 1524    Education provided Yes   Education Details Followed up with patient regarding possibility of speech therapy and benefits of bringing  wife into therapy for improved carryover/cueing with gait/balance/novel activities   Person(s) Educated Patient   Methods Explanation   Comprehension Verbalized understanding             PT Long Term Goals - 11/15/15 2157    PT LONG TERM GOAL #1   Title Pt will be independent with HEP for improved balance, gait, and functional mobility.  UPDATED/ONGOING GOAL TARGET 12/16/15   Time 4   Period Weeks   Status On-going   PT LONG TERM GOAL #2   Title Pt will improve 5x sit<>stand to less than or equal to  12 sec for improved transfer effiemcy.   Time 4   Period Weeks   Status Achieved   PT LONG TERM GOAL #3   Title Pt will perform  sit<>stand transfers 8 of 10 reps from less than 18 inches without UE support independenlty no posterior lean    Time 4   Period Weeks   Status Achieved   PT LONG TERM GOAL #4   Title Pt will improve Dynamic Gait Index score to at least 19/24 for decreased fall risk.  UPDATED GOAL TARGET 12/16/15   Time 4   Period Weeks   Status Revised   PT LONG TERM GOAL #5   Title Pt will verbalize understanding of techniques to reduce freezing with gait.  UPDATED/ONGOING GOAL TARGET 11/15/15   Baseline Pt able to verbalize weightshifting as a technique for reducing freezing with gait.    Time 4   Period Weeks   Status On-going   PT LONG TERM GOAL #6   Title Pt will verbalize understanding of local Parkinson's dsease resources.  UPDATED/ONGOING GOAL TARGET 12/16/14   Time 4   Period Weeks   Status On-going               Plan - 11/30/15 1526    Clinical Impression Statement Gait and balance activities performed on outdoor and compliant surfaces today.  Pt generally has decreased foot clearance and several LOB with transitional movement activities today.  Again, recommened wife come in for therapy next week as plans for discharge/reviewing HEP and community fitness likely next week.   Pt will benefit from skilled therapeutic intervention in order to improve on the following deficits Abnormal gait;Decreased balance;Decreased mobility;Decreased strength;Difficulty walking;Postural dysfunction   Rehab Potential Good   PT Frequency 2x / week   PT Duration 4 weeks  per renewal 11/15/15   PT Treatment/Interventions ADLs/Self Care Home Management;Therapeutic exercise;Therapeutic activities;Functional mobility training;Gait training;Balance training;Neuromuscular re-education;Patient/family education   PT Next Visit Plan Provide information on optimal fitness program post PT  D/C; discuss/review HEP; plan for D/C next week   PT Home Exercise Plan PWR! seated and standing.    Consulted and Agree with Plan of Care Patient        Problem List Patient Active Problem List   Diagnosis Date Noted  . 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 Acute urinary retention 11/23/2012  . BPH (benign prostatic hyperplasia) 11/23/2012    Jansen Goodpasture W. 11/30/2015, 3:34 PM  Frazier Butt., PT  [facility] [address] [place], [place], [address] Phone: [phone]   Fax:  [phone]  Name: Adam Cordova MRN: [id number] Date of Birth: 1956-03-13

## 2015-12-05 ENCOUNTER — Ambulatory Visit: Payer: PPO | Attending: Family Medicine | Admitting: Physical Therapy

## 2015-12-05 DIAGNOSIS — H5213 Myopia, bilateral: Secondary | ICD-10-CM | POA: Diagnosis not present

## 2015-12-05 DIAGNOSIS — H524 Presbyopia: Secondary | ICD-10-CM | POA: Diagnosis not present

## 2015-12-05 DIAGNOSIS — H43813 Vitreous degeneration, bilateral: Secondary | ICD-10-CM | POA: Diagnosis not present

## 2015-12-05 DIAGNOSIS — H33312 Horseshoe tear of retina without detachment, left eye: Secondary | ICD-10-CM | POA: Diagnosis not present

## 2015-12-05 DIAGNOSIS — H52223 Regular astigmatism, bilateral: Secondary | ICD-10-CM | POA: Diagnosis not present

## 2015-12-05 DIAGNOSIS — C44519 Basal cell carcinoma of skin of other part of trunk: Secondary | ICD-10-CM | POA: Diagnosis not present

## 2015-12-05 DIAGNOSIS — H2513 Age-related nuclear cataract, bilateral: Secondary | ICD-10-CM | POA: Diagnosis not present

## 2015-12-05 DIAGNOSIS — L905 Scar conditions and fibrosis of skin: Secondary | ICD-10-CM | POA: Diagnosis not present

## 2015-12-07 ENCOUNTER — Ambulatory Visit: Payer: PPO | Admitting: Physical Therapy

## 2015-12-13 DIAGNOSIS — H33312 Horseshoe tear of retina without detachment, left eye: Secondary | ICD-10-CM | POA: Diagnosis not present

## 2015-12-19 DIAGNOSIS — H5213 Myopia, bilateral: Secondary | ICD-10-CM | POA: Diagnosis not present

## 2015-12-19 DIAGNOSIS — H52223 Regular astigmatism, bilateral: Secondary | ICD-10-CM | POA: Diagnosis not present

## 2015-12-19 DIAGNOSIS — H59812 Chorioretinal scars after surgery for detachment, left eye: Secondary | ICD-10-CM | POA: Diagnosis not present

## 2015-12-19 DIAGNOSIS — H524 Presbyopia: Secondary | ICD-10-CM | POA: Diagnosis not present

## 2016-01-02 DIAGNOSIS — G20 Parkinson's disease: Secondary | ICD-10-CM | POA: Diagnosis not present

## 2016-01-25 DIAGNOSIS — C44612 Basal cell carcinoma of skin of right upper limb, including shoulder: Secondary | ICD-10-CM | POA: Diagnosis not present

## 2016-01-25 DIAGNOSIS — C44519 Basal cell carcinoma of skin of other part of trunk: Secondary | ICD-10-CM | POA: Diagnosis not present

## 2016-01-25 DIAGNOSIS — L570 Actinic keratosis: Secondary | ICD-10-CM | POA: Diagnosis not present

## 2016-01-25 DIAGNOSIS — D485 Neoplasm of uncertain behavior of skin: Secondary | ICD-10-CM | POA: Diagnosis not present

## 2016-02-14 DIAGNOSIS — C44519 Basal cell carcinoma of skin of other part of trunk: Secondary | ICD-10-CM | POA: Diagnosis not present

## 2016-02-14 DIAGNOSIS — C44612 Basal cell carcinoma of skin of right upper limb, including shoulder: Secondary | ICD-10-CM | POA: Diagnosis not present

## 2016-02-26 DIAGNOSIS — M79672 Pain in left foot: Secondary | ICD-10-CM | POA: Diagnosis not present

## 2016-03-06 ENCOUNTER — Ambulatory Visit
Admission: RE | Admit: 2016-03-06 | Discharge: 2016-03-06 | Disposition: A | Discharge status: Home / Self Care | Payer: PPO | Source: Ambulatory Visit | Attending: Family Medicine | Admitting: Family Medicine

## 2016-03-06 ENCOUNTER — Other Ambulatory Visit: Payer: Self-pay | Admitting: Family Medicine

## 2016-03-06 DIAGNOSIS — G20 Parkinson's disease: Secondary | ICD-10-CM | POA: Diagnosis not present

## 2016-03-06 DIAGNOSIS — R109 Unspecified abdominal pain: Secondary | ICD-10-CM

## 2016-03-06 DIAGNOSIS — M47816 Spondylosis without myelopathy or radiculopathy, lumbar region: Secondary | ICD-10-CM | POA: Diagnosis not present

## 2016-03-08 DIAGNOSIS — N5201 Erectile dysfunction due to arterial insufficiency: Secondary | ICD-10-CM | POA: Diagnosis not present

## 2016-03-08 DIAGNOSIS — R3914 Feeling of incomplete bladder emptying: Secondary | ICD-10-CM | POA: Diagnosis not present

## 2016-03-08 DIAGNOSIS — N401 Enlarged prostate with lower urinary tract symptoms: Secondary | ICD-10-CM | POA: Diagnosis not present

## 2016-03-08 DIAGNOSIS — R972 Elevated prostate specific antigen [PSA]: Secondary | ICD-10-CM | POA: Diagnosis not present

## 2016-03-11 DIAGNOSIS — G20 Parkinson's disease: Secondary | ICD-10-CM | POA: Diagnosis not present

## 2016-03-12 DIAGNOSIS — S92355D Nondisplaced fracture of fifth metatarsal bone, left foot, subsequent encounter for fracture with routine healing: Secondary | ICD-10-CM | POA: Diagnosis not present

## 2016-03-22 ENCOUNTER — Other Ambulatory Visit: Payer: Self-pay | Admitting: *Deleted

## 2016-03-22 NOTE — Patient Outreach (Signed)
[place] [facility]) Care Management  03/22/2016  Adam Cordova June 20, 1956 [id number]  Referral via HTA plan. Call from patient.   Telephone call to patient; left HIPPA compliant voice mail requesting call back.  Plan:  Will follow up.   Sherrin Daisy, RN BSN [place] Management Coordinator [facility] Care Management  [phone]

## 2016-03-25 ENCOUNTER — Other Ambulatory Visit: Payer: Self-pay | Admitting: *Deleted

## 2016-03-25 NOTE — Patient Outreach (Signed)
[facility]) Care Management  03/25/2016  Adam Cordova [birth year] [id number]  Telephone call to patient who was advised of [facility] services. HIPPA verification received.  Better to call another day in week to complete screening assessment.   Plan:  Screening assessment time set with patient agreement. Will follow up.  Sherrin Daisy, RN BSN [place] Management Coordinator [facility] Care Management  [phone]

## 2016-03-27 ENCOUNTER — Other Ambulatory Visit: Payer: Self-pay | Admitting: *Deleted

## 2016-03-27 ENCOUNTER — Encounter: Payer: Self-pay | Admitting: *Deleted

## 2016-03-27 NOTE — Patient Outreach (Signed)
[place] [facility]) Care Management  03/27/2016  Adam Cordova Sep 04, 1955 [id number]  Telephone call to patient; HIPPA verification received. Patient advised of [place] Management services Patient agrees to screening assessment which was completed.   Patient voices he is seeing primary care provider and specialists at scheduled intervals. States no problems scheduling appointments or getting to appointments.   States he is independent in activities of daily living but that spouse fixes medication box for him. States he is consistent with taking medications as prescribed.  Patient voices understanding of importance of taking medications as prescribed. Patient states he is using  local & mail order pharmacy without problems.   Patient voices that he is very consistent with being active & exercising at [facility] 5 days per week and is also eating healthy. States current weight is 172 pounds, height 5"10".     Patient voices that he & spouse are managing his chronic health condition-Parkinson's disease and do not feel he needs services of [facility] (community or telephonic ) at this time.   Patient thanked this Electronics engineer for call & will accept [facility] contact information to use if needed.   Plan: Send MD closure letter. Send [facility] contact information to patient. Close case-send to care management assistant.   Sherrin Daisy, RN BSN [place] Management Coordinator [facility] Care Management  [phone]

## 2016-06-05 DIAGNOSIS — R441 Visual hallucinations: Secondary | ICD-10-CM | POA: Diagnosis not present

## 2016-06-05 DIAGNOSIS — G4710 Hypersomnia, unspecified: Secondary | ICD-10-CM | POA: Diagnosis not present

## 2016-06-05 DIAGNOSIS — F329 Major depressive disorder, single episode, unspecified: Secondary | ICD-10-CM | POA: Diagnosis not present

## 2016-06-05 DIAGNOSIS — G20 Parkinson's disease: Secondary | ICD-10-CM | POA: Diagnosis not present

## 2016-06-05 DIAGNOSIS — R440 Auditory hallucinations: Secondary | ICD-10-CM | POA: Diagnosis not present

## 2016-06-05 DIAGNOSIS — Z79899 Other long term (current) drug therapy: Secondary | ICD-10-CM | POA: Diagnosis not present

## 2016-06-11 DIAGNOSIS — R441 Visual hallucinations: Secondary | ICD-10-CM | POA: Insufficient documentation

## 2016-06-11 DIAGNOSIS — F32A Depression, unspecified: Secondary | ICD-10-CM | POA: Insufficient documentation

## 2016-06-11 DIAGNOSIS — G4710 Hypersomnia, unspecified: Secondary | ICD-10-CM | POA: Insufficient documentation

## 2016-06-11 DIAGNOSIS — R440 Auditory hallucinations: Secondary | ICD-10-CM | POA: Insufficient documentation

## 2016-06-20 DIAGNOSIS — J069 Acute upper respiratory infection, unspecified: Secondary | ICD-10-CM | POA: Diagnosis not present

## 2016-08-01 ENCOUNTER — Encounter: Payer: Self-pay | Admitting: Physical Therapy

## 2016-08-01 NOTE — Therapy (Signed)
[facility] [address] [place], [place], [address] Phone: [phone]   Fax:  [phone]  Patient Details  Name: Adam Cordova MRN: [id number] Date of Birth: [birth year] Referring Provider:  No ref. provider found  Encounter Date: 08/01/2016   PHYSICAL THERAPY DISCHARGE SUMMARY  Visits from Start of Care: 16  Current functional level related to goals / functional outcomes:     PT Long Term Goals - 11/15/15 2157      PT LONG TERM GOAL #1   Title Pt will be independent with HEP for improved balance, gait, and functional mobility.  UPDATED/ONGOING GOAL TARGET 12/16/15   Time 4   Period Weeks   Status On-going     PT LONG TERM GOAL #2   Title Pt will improve 5x sit<>stand to less than or equal to 12 sec for improved transfer effiemcy.   Time 4   Period Weeks   Status Achieved     PT LONG TERM GOAL #3   Title Pt will perform  sit<>stand transfers 8 of 10 reps from less than 18 inches without UE support independenlty no posterior lean    Time 4   Period Weeks   Status Achieved     PT LONG TERM GOAL #4   Title Pt will improve Dynamic Gait Index score to at least 19/24 for decreased fall risk.  UPDATED GOAL TARGET 12/16/15   Time 4   Period Weeks   Status Revised     PT LONG TERM GOAL #5   Title Pt will verbalize understanding of techniques to reduce freezing with gait.  UPDATED/ONGOING GOAL TARGET 11/15/15   Baseline Pt able to verbalize weightshifting as a technique for reducing freezing with gait.    Time 4   Period Weeks   Status On-going     PT LONG TERM GOAL #6   Title Pt will verbalize understanding of local Parkinson's dsease resources.  UPDATED/ONGOING GOAL TARGET 12/16/14   Time 4   Period Weeks   Status On-going    Goals not able to be fully assessed, as pt did not return to PT for formal discharge visit.   Remaining deficits: Balance, gait, safety, posture   Education / Equipment: HEP,  techniques to reduce freezing; unable to fully address due to pt not returning for discharge.   Plan: Patient agrees to discharge.  Patient goals were not met. Patient is being discharged due to not returning since the last visit.  ?????   Frazier Butt., PT  Adam Pickering W. 08/01/2016, 9:57 AM  [place] [address] [place], [place], [address] Phone: [phone]   Fax:  [phone]

## 2016-08-20 DIAGNOSIS — L570 Actinic keratosis: Secondary | ICD-10-CM | POA: Diagnosis not present

## 2016-08-20 DIAGNOSIS — Z23 Encounter for immunization: Secondary | ICD-10-CM | POA: Diagnosis not present

## 2016-08-20 DIAGNOSIS — Z85828 Personal history of other malignant neoplasm of skin: Secondary | ICD-10-CM | POA: Diagnosis not present

## 2016-08-20 DIAGNOSIS — L821 Other seborrheic keratosis: Secondary | ICD-10-CM | POA: Diagnosis not present

## 2016-08-20 DIAGNOSIS — D0359 Melanoma in situ of other part of trunk: Secondary | ICD-10-CM | POA: Diagnosis not present

## 2016-08-20 DIAGNOSIS — C44712 Basal cell carcinoma of skin of right lower limb, including hip: Secondary | ICD-10-CM | POA: Diagnosis not present

## 2016-08-20 DIAGNOSIS — D225 Melanocytic nevi of trunk: Secondary | ICD-10-CM | POA: Diagnosis not present

## 2016-08-20 DIAGNOSIS — D485 Neoplasm of uncertain behavior of skin: Secondary | ICD-10-CM | POA: Diagnosis not present

## 2016-08-20 DIAGNOSIS — L814 Other melanin hyperpigmentation: Secondary | ICD-10-CM | POA: Diagnosis not present

## 2016-09-10 DIAGNOSIS — G20 Parkinson's disease: Secondary | ICD-10-CM | POA: Diagnosis not present

## 2016-09-10 DIAGNOSIS — R2689 Other abnormalities of gait and mobility: Secondary | ICD-10-CM | POA: Diagnosis not present

## 2016-09-10 DIAGNOSIS — R269 Unspecified abnormalities of gait and mobility: Secondary | ICD-10-CM | POA: Diagnosis not present

## 2016-09-10 DIAGNOSIS — G4710 Hypersomnia, unspecified: Secondary | ICD-10-CM | POA: Diagnosis not present

## 2016-09-17 DIAGNOSIS — Z0000 Encounter for general adult medical examination without abnormal findings: Secondary | ICD-10-CM | POA: Diagnosis not present

## 2016-09-17 DIAGNOSIS — E7800 Pure hypercholesterolemia, unspecified: Secondary | ICD-10-CM | POA: Diagnosis not present

## 2016-09-17 DIAGNOSIS — G20 Parkinson's disease: Secondary | ICD-10-CM | POA: Diagnosis not present

## 2016-09-17 DIAGNOSIS — I10 Essential (primary) hypertension: Secondary | ICD-10-CM | POA: Diagnosis not present

## 2016-09-17 DIAGNOSIS — N400 Enlarged prostate without lower urinary tract symptoms: Secondary | ICD-10-CM | POA: Diagnosis not present

## 2016-09-17 DIAGNOSIS — Z23 Encounter for immunization: Secondary | ICD-10-CM | POA: Diagnosis not present

## 2016-09-18 DIAGNOSIS — R2689 Other abnormalities of gait and mobility: Secondary | ICD-10-CM | POA: Insufficient documentation

## 2016-09-18 DIAGNOSIS — R269 Unspecified abnormalities of gait and mobility: Secondary | ICD-10-CM | POA: Insufficient documentation

## 2016-09-20 DIAGNOSIS — C44712 Basal cell carcinoma of skin of right lower limb, including hip: Secondary | ICD-10-CM | POA: Diagnosis not present

## 2016-09-20 DIAGNOSIS — L988 Other specified disorders of the skin and subcutaneous tissue: Secondary | ICD-10-CM | POA: Diagnosis not present

## 2016-09-20 DIAGNOSIS — D0359 Melanoma in situ of other part of trunk: Secondary | ICD-10-CM | POA: Diagnosis not present

## 2016-11-07 DIAGNOSIS — H524 Presbyopia: Secondary | ICD-10-CM | POA: Diagnosis not present

## 2016-11-07 DIAGNOSIS — H52223 Regular astigmatism, bilateral: Secondary | ICD-10-CM | POA: Diagnosis not present

## 2016-11-07 DIAGNOSIS — H2513 Age-related nuclear cataract, bilateral: Secondary | ICD-10-CM | POA: Diagnosis not present

## 2016-11-07 DIAGNOSIS — H5213 Myopia, bilateral: Secondary | ICD-10-CM | POA: Diagnosis not present

## 2016-11-19 DIAGNOSIS — C44719 Basal cell carcinoma of skin of left lower limb, including hip: Secondary | ICD-10-CM | POA: Diagnosis not present

## 2016-11-19 DIAGNOSIS — L821 Other seborrheic keratosis: Secondary | ICD-10-CM | POA: Diagnosis not present

## 2016-11-19 DIAGNOSIS — L570 Actinic keratosis: Secondary | ICD-10-CM | POA: Diagnosis not present

## 2016-11-19 DIAGNOSIS — Z23 Encounter for immunization: Secondary | ICD-10-CM | POA: Diagnosis not present

## 2016-11-19 DIAGNOSIS — D485 Neoplasm of uncertain behavior of skin: Secondary | ICD-10-CM | POA: Diagnosis not present

## 2016-11-19 DIAGNOSIS — Z85828 Personal history of other malignant neoplasm of skin: Secondary | ICD-10-CM | POA: Diagnosis not present

## 2016-11-19 DIAGNOSIS — Z85820 Personal history of malignant melanoma of skin: Secondary | ICD-10-CM | POA: Diagnosis not present

## 2016-12-10 DIAGNOSIS — G4710 Hypersomnia, unspecified: Secondary | ICD-10-CM | POA: Diagnosis not present

## 2016-12-10 DIAGNOSIS — R269 Unspecified abnormalities of gait and mobility: Secondary | ICD-10-CM | POA: Diagnosis not present

## 2016-12-10 DIAGNOSIS — G20 Parkinson's disease: Secondary | ICD-10-CM | POA: Diagnosis not present

## 2016-12-10 DIAGNOSIS — R2689 Other abnormalities of gait and mobility: Secondary | ICD-10-CM | POA: Diagnosis not present

## 2016-12-26 ENCOUNTER — Ambulatory Visit: Payer: PPO | Admitting: Occupational Therapy

## 2016-12-26 ENCOUNTER — Ambulatory Visit: Payer: PPO | Attending: Neurology | Admitting: Physical Therapy

## 2016-12-26 ENCOUNTER — Encounter: Payer: Self-pay | Admitting: Occupational Therapy

## 2016-12-26 DIAGNOSIS — R41840 Attention and concentration deficit: Secondary | ICD-10-CM | POA: Insufficient documentation

## 2016-12-26 DIAGNOSIS — R41844 Frontal lobe and executive function deficit: Secondary | ICD-10-CM | POA: Insufficient documentation

## 2016-12-26 DIAGNOSIS — R2689 Other abnormalities of gait and mobility: Secondary | ICD-10-CM | POA: Insufficient documentation

## 2016-12-26 DIAGNOSIS — R293 Abnormal posture: Secondary | ICD-10-CM | POA: Diagnosis not present

## 2016-12-26 DIAGNOSIS — R2681 Unsteadiness on feet: Secondary | ICD-10-CM | POA: Insufficient documentation

## 2016-12-26 DIAGNOSIS — R278 Other lack of coordination: Secondary | ICD-10-CM | POA: Diagnosis not present

## 2016-12-26 DIAGNOSIS — R41842 Visuospatial deficit: Secondary | ICD-10-CM | POA: Insufficient documentation

## 2016-12-26 DIAGNOSIS — R29898 Other symptoms and signs involving the musculoskeletal system: Secondary | ICD-10-CM | POA: Insufficient documentation

## 2016-12-26 DIAGNOSIS — R29818 Other symptoms and signs involving the nervous system: Secondary | ICD-10-CM

## 2016-12-26 DIAGNOSIS — C44719 Basal cell carcinoma of skin of left lower limb, including hip: Secondary | ICD-10-CM | POA: Diagnosis not present

## 2016-12-26 NOTE — Therapy (Signed)
[facility] [address] [place], [place], [address] Phone: [phone]   Fax:  [phone]  Occupational Therapy Evaluation  Patient Details  Name: Adam Cordova MRN: [id number] Date of Birth: Jun 27, 1956 Referring Provider: Gwendolyn Fill, PA  Encounter Date: 12/26/2016      OT End of Session - 12/26/16 1046    Visit Number 1   Number of Visits 17   Date for OT Re-Evaluation 02/24/17   Authorization Type [employer], no visit limit, no auth req; G-code needed   Authorization - Visit Number 1   Authorization - Number of Visits 10   OT Start Time 0850   OT Stop Time [phone]   OT Time Calculation (min) 48 min   Activity Tolerance Patient tolerated treatment well   Behavior During Therapy [facility] for tasks assessed/performed      Past Medical History:  Diagnosis Date  . BPH (benign prostatic hypertrophy) with urinary retention   . Foley catheter in place   . Parkinson disease [facility])    neurologist--  dr Richardean Chimera  in [place]  . UTI (urinary tract infection)     Past Surgical History:  Procedure Laterality Date  . TRANSURETHRAL RESECTION OF PROSTATE N/A 11/23/2012   Procedure: TRANSURETHRAL RESECTION OF THE PROSTATE WITH GYRUS INSTRUMENTS;  Surgeon: Bernestine Amass, MD;  Location: [facility];  Service: Urology;  Laterality: N/A;    There were no vitals filed for this visit.      Subjective Assessment - 12/26/16 0853    Subjective  Pt/wife reports incr falls over last approx 64months and decr in IADL performance over last approx 1.5 years   Patient is accompained by: Family member  wife   Pertinent History Parkinson's disease diagnosed in 2013 (tremors since 2011), BPH, hx of multiple falls (previous injuries include rib fx and punctured lung)   Limitations fall risk, cognitive deficits, occasional diplopia   Patient Stated Goals improve balance, improve writing, improve coordination, improve  eating   Currently in Pain? No/denies           [facility] OT Assessment - 12/26/16 0853      Assessment   Diagnosis Parkinson's disease    Referring Provider Gwendolyn Fill, PA   Onset Date 10/20/11  decline in function/incr falls in last 1-1.5 yrs   Prior Therapy physcial therapy approx 1 year ago     Precautions   Precautions Fall   Precaution Comments Reports falls occur more frequently with turns/changes of directions, initiating gait, busy environments     Balance Screen   Has the patient fallen in the past 6 months Yes  ty;ically falls backwards, more falls with sit>stand   How many times? at least 15  carrying items stepping over threshold, fell backwards     Home  Environment   Family/patient expects to be discharged to: Private residence   Lives With [place] with household mobility without device;Independent with basic ADLs   Vocation Retired  Armed forces operational officer   Leisure [employer] 2x/wk, goes to gym everyday (recumbent bike, [employer], swimming), has stopped playing golf     ADL   Eating/Feeding --  difficulty cutting food, difficulty scooping/spills   Grooming --  no longer can put in contacts over last 8 months   Grooming Patient Percentage --  electric toothbrush    Upper Body Bathing --  incr time   Lower  Body Bathing Modified independent   Upper Body Dressing Increased time  needs A for smaller buttons/collars, incr time tying shoes   Lower Body Dressing Increased time  difficulty with socks   Toilet Tranfer Modified independent  difficulty, needs grab bar   Toileting - Clothing Manipulation Increased time   Toileting -  Hygiene Modified Independent   Tub/Shower Transfer Modified independent   Warden/ranger Grab bars   ADL comments Pt reports assist with dressing due to time constraints at times.  Difficulty with bed mobility (in/out, turning over), difficulty with  transfers/sit>stand     IADL   Prior Level of Function Shopping mod I   Shopping --  able to do some shopping, fall in crowded bar   Prior Level of Function Light Housekeeping pt did assist with things previously  has vacuumed some    Light Housekeeping --  wife performs   Prior Level of Function Meal Prep pt helped more in the past, but has difficulty with organization per wife  can make sandwich   Meal Prep --  wife performs all now   Prior Level of Function Community Mobility drove previously   [employer] --  driving around town, has to focus more   Medication Management Has difficulty remembering to take medication  wife sets-up and reminds pt    Doctor, hospital --  performs with wife, difficulty with writing     Mobility   Mobility Status History of falls;Freezing   Mobility Status Comments pt reports freezing with turns, and with initation     Written Expression   Dominant Hand Right   Handwriting 75% legible;Mild micrographia  attempted 4x for deposit slip, typically worse per wife     Vision - History   Baseline Vision Bifocals   Additional Comments rare diplopia around dusk/dawn     Cognition   Overall Cognitive Status Impaired/Different from baseline   Area of Impairment Memory;Attention;Awareness   Memory Decreased short-term memory   Memory Comments can't do math in head, slower reading and difficulty following story   Awareness Intellectual;Emergent   Attention --  decr    Bradyphrenia Yes     Observation/Other Assessments   Observations rounded shoulders   Other Surveys  Select   Physical Performance Test   Yes   Simulated Eating Time (seconds) 14.53   Donning Doffing Jacket Time (seconds) unable due to difficulty with orientation of gown and LOB x3 with min A required to recover   Donning Doffing Jacket Comments fastening/unfastening 3 buttons in >55min (did not unfasten last button--pt with difficulty with fastening and unfastening)      Coordination   9 Hole Peg Test Right;Left   Right 9 Hole Peg Test 47.69   Left 9 Hole Peg Test 34.69 (took out 2 x1)   Box and Blocks unable to test due to time constraints   Tremors resting and some action tremor, RUE>LUE     Tone   Assessment Location Right Upper Extremity;Left Upper Extremity     ROM / Strength   AROM / PROM / Strength AROM     AROM   Overall AROM  Within functional limits for tasks performed   Overall AROM Comments for BUEs however, noted decr full composite finger, wrist, elbow ext of RUE     RUE Tone   RUE Tone --  mild-moderate     LUE Tone   LUE Tone Mild  OT Education - 12/26/16 1037    Education provided Yes   Education Details OT eval results/POC; recommendation for wife to ride with pt when driving to provide feedback; common ADL difficulties with PD   Person(s) Educated Patient;Spouse   Methods Explanation   Comprehension Verbalized understanding          OT Short Term Goals - 12/26/16 1215      OT SHORT TERM GOAL #1   Title Pt will be independent with PD-specific HEP.--check STGs 01/24/17   Time 4   Period Weeks   Status New     OT SHORT TERM GOAL #2   Title Pt will improve coordination for ADLs as shown by improving time on 9-hole peg test by at least 4sec with dominant RUE.   Baseline R-47.69sec, L-34.69sec   Time 4   Period Weeks   Status New     OT SHORT TERM GOAL #3   Title Pt will be able to don/doff jacket safely mod I (PPT#4 with jacket) in 90sec or less.   Baseline unable in standing due to difficulty with orientation (gown) and LOB x3 with min A to recover   Time 4   Period Weeks   Status New     OT SHORT TERM GOAL #4   Title Pt/wife will report incr ability to perform bilateral coordinated ADL tasks as shown by fastening/unfastening 3 buttons in less than 36min utilizing strategies.   Time 4   Period Weeks   Status New     OT SHORT TERM GOAL #5   Title Assess box and  blocks test and add goal as appropriate   Time 4   Period Weeks   Status New     Additional Short Term Goals   Additional Short Term Goals Yes     OT SHORT TERM GOAL #6   Title Pt will write 3 sentences with at least 90% legibility and only min decr in size.   Time 4   Period Weeks   Status New           OT Long Term Goals - 12/26/16 1258      OT LONG TERM GOAL #1   Title Pt/caregiver will verbalize understanding of adaptive strategies/AE for ADLs (including strategies for eating, writing, bed mobility, dressing, carrying objects, IADLs).--check LTGs 02/24/17   Time 8   Period Weeks   Status New     OT LONG TERM GOAL #2   Title Pt will improve coordination for ADLs as shown by improving time on 9-hole peg test by at least 8sec with dominant RUE.   Baseline R-47.69sec   Time 8   Period Weeks   Status New     OT LONG TERM GOAL #3   Title Pt will report incr ease with eating (getting food on utensil, less spills, and incr ease with cutting).   Time 8   Period Weeks   Status New     OT LONG TERM GOAL #4   Title Pt will be able to don/doff jacket safely mod I (PPT#4 with jacket) in 60sec or less.   Time 8   Period Weeks   Status New     OT LONG TERM GOAL #5   Title Pt will write 3 sentences with at least 100% legibility and only min decr in size.   Time 8   Period Weeks   Status New     Long Term Additional Goals   Additional Long Term Goals Yes  OT LONG TERM GOAL #6   Title Pt will write 3 sentences with at least 100% legibility and only min decr in size.   Time 8   Period Weeks   Status New     OT LONG TERM GOAL #7   Title Assess Standing Functional Reach and set goal prn   Period Weeks   Status New               Plan - 12/26/16 1048    Clinical Impression Statement Pt is a 60 y.o. male with diagnosis of Parkinson's disease (dx in 2013).  Pt with PMH that includes:  Parkinson's disease, BPH, hx of multiple falls (previous injuries include rib  fx and punctured lung).  Pt has had no previous occupational therapy.  Pt presents with bradykinesia/hypokinesia, rigidity, timing deficits/freezing, decr functional mobility, decr balance, abnormal posture, decr coordination, visuospatial deficits, cognitive deficits.  Pt would benefit from occupational therapy to address these deficits for improve safety/ease/independence with ADLs/IADLs and to prevent future complications.     Rehab Potential Good   Clinical Impairments Affecting Rehab Potential cognitive deficits, severity of deficits   OT Frequency 2x / week   OT Duration 8 weeks   OT Treatment/Interventions Self-care/ADL training;Moist Heat;Fluidtherapy;DME and/or AE instruction;Splinting;Patient/family education;Balance training;Therapeutic exercises;Therapeutic activities;Therapeutic exercise;Ultrasound;Neuromuscular education;Functional Mobility Training;Passive range of motion;Cognitive remediation/compensation;Visual/perceptual remediation/compensation;Manual Therapy;Energy conservation;Parrafin;Cryotherapy   Plan check vision (tracking), assess box and blocks test and standing functional reach test and set goals as appropriate;  safety for ADLs, coordination HEP   Recommended Other Services current with PT   Consulted and Agree with Plan of Care Patient      Patient will benefit from skilled therapeutic intervention in order to improve the following deficits and impairments:  Decreased cognition, Decreased mobility, Impaired perceived functional ability, Impaired vision/preception, Impaired UE functional use, Decreased knowledge of use of DME, Decreased balance, Decreased activity tolerance, Impaired tone, Improper spinal/pelvic alignment, Decreased safety awareness, Decreased coordination, Decreased range of motion, Abnormal gait (bradykinesia/hypokinesia, timing deficits)  Visit Diagnosis: Other symptoms and signs involving the nervous system - Plan: Ot plan of care cert/re-cert  Other  symptoms and signs involving the musculoskeletal system - Plan: Ot plan of care cert/re-cert  Other lack of coordination - Plan: Ot plan of care cert/re-cert  Unsteadiness on feet - Plan: Ot plan of care cert/re-cert  Abnormal posture - Plan: Ot plan of care cert/re-cert  Other abnormalities of gait and mobility - Plan: Ot plan of care cert/re-cert  Visuospatial deficit - Plan: Ot plan of care cert/re-cert  Frontal lobe and executive function deficit - Plan: Ot plan of care cert/re-cert  Attention and concentration deficit - Plan: Ot plan of care cert/re-cert      G-Codes - 78/29/56 1546    Functional Assessment Tool Used (Outpatient only) >28min for 3 button/unbutton, frequent falls with IADLs, PPT#4 unable due to difficulty with orientation/LOB x3 with min A, 75% legibility with writing   Functional Limitation Self care   Self Care Current Status (O1308) At least 60 percent but less than 80 percent impaired, limited or restricted   Self Care Goal Status (M5784) At least 20 percent but less than 40 percent impaired, limited or restricted      Problem List Patient Active Problem List   Diagnosis Date Noted  . 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 Acute urinary retention 11/23/2012  . BPH (benign prostatic hyperplasia) 11/23/2012    [facility] 12/26/2016, 3:47 PM  [facility] [address]  [place], [place], [address] Phone: [phone]   Fax:  [phone]  Name: Adam Cordova MRN: [id number] Date of Birth: [birth year]   Vianne Bulls, OTR/L [facility] [address] [facility], [place]  [address] [phone] phone [phone] 12/26/16 3:47 PM

## 2016-12-26 NOTE — Therapy (Addendum)
[facility] [address] [place], [place], [address] Phone: [phone]   Fax:  [phone]  Physical Therapy Evaluation  Patient Details  Name: Adam Cordova MRN: [id number] Date of Birth: Apr 05, 1956 Referring Provider: [name], PA  Encounter Date: 12/26/2016      PT End of Session - 12/26/16 1903    Visit Number 1   Number of Visits 17   Date for PT Re-Evaluation 02/24/17   Authorization Type [employer] every 10th visit   PT Start Time 0806   PT Stop Time 0851   PT Time Calculation (min) 45 min   Equipment Utilized During Treatment Gait belt   Activity Tolerance Patient tolerated treatment well   Behavior During Therapy [facility] for tasks assessed/performed      Past Medical History:  Diagnosis Date  . BPH (benign prostatic hypertrophy) with urinary retention   . Foley catheter in place   . Parkinson disease [facility])    neurologist--  dr Richardean Chimera  in [place]  . UTI (urinary tract infection)     Past Surgical History:  Procedure Laterality Date  . TRANSURETHRAL RESECTION OF PROSTATE N/A 11/23/2012   Procedure: TRANSURETHRAL RESECTION OF THE PROSTATE WITH GYRUS INSTRUMENTS;  Surgeon: Bernestine Amass, MD;  Location: [facility];  Service: Urology;  Laterality: N/A;    There were no vitals filed for this visit.       Subjective Assessment - 12/26/16 0812    Subjective Pt reports being in [employer] class, and he reports having difficulty with balance.  Wife reports having more falls, especially prior to recent visit to neurology PA at [facility].  Wife reports his PD medications are being tweaked.  Falls vary-wife reports at least 15-20 falls in the past 6 months.     Patient is accompained by: Family member  wife   Pertinent History Parkinson's disease   Patient Stated Goals Pt's goals for therapy are to "dramatically improve my sense of balance."   Currently in Pain? No/denies            [facility] PT Assessment - 12/26/16 0816      Assessment   Medical Diagnosis Parkinson's disease   Referring Provider [name], PA   Onset Date/Surgical Date 12/10/16  MD visit     Precautions   Precautions Fall   Precaution Comments Reports falls occur more frequently with turns/changes of directions, initiating gait, busy environments     Balance Screen   Has the patient fallen in the past 6 months Yes   How many times? 15  -20 times at least   Has the patient had a decrease in activity level because of a fear of falling?  Yes   Is the patient reluctant to leave their home because of a fear of falling?  No     Home Environment   Living Environment Private residence   Living Arrangements Spouse/significant other   Available Help at Discharge Family   Type of [place] to enter   Entrance Stairs-Number of Steps 1   [place] One level   Home Equipment --  has trekking poles     Prior Function   Level of Independence Independent with household mobility without device;Independent with basic ADLs   Vocation Retired   Leisure [employer] 2x/wk, goes to gym everyday (recumbent bike, [employer], swimming), has stopped playing golf  Posture/Postural Control   Posture/Postural Control Postural limitations   Postural Limitations Forward head;Rounded Shoulders   Posture Comments Bilateral increased internal rotation/toeing in      ROM / Strength   AROM / PROM / Strength Strength;PROM     AROM   Overall AROM Comments bilateral dorsiflexion +5 degrees     PROM   Overall PROM Comments Increased tone noted RLE with P/ROM     Strength   Overall Strength Comments Grossly tested 4/5 bilateral lower extremities     Transfers   Transfers Sit to Stand;Stand to Sit   Sit to Stand 5: Supervision;Without upper extremity assist;From chair/3-in-1   Five time sit to stand comments  9.53  slight posterior  lean last 2 trials   Stand to Sit 5: Supervision;Without upper extremity assist;To chair/3-in-1   Comments Pt reports difficulty with getting up and down from sofa and in/out car     Ambulation/Gait   Ambulation/Gait Yes   Ambulation/Gait Assistance 4: Min guard   Ambulation Distance (Feet) 200 Feet   Assistive device None   Gait Pattern Step-through pattern;Decreased arm swing - right;Decreased arm swing - left;Decreased dorsiflexion - right;Decreased dorsiflexion - left;Scissoring;Decreased trunk rotation;Narrow base of support  increased bilateral supination/inversion with gait   Ambulation Surface Level;Indoor   Gait velocity 8.53 sec= 3.85 ft/sec   Pre-Gait Activities Upon standing in lobby to enter gym, pt has LOB initiating gait, reaches out for wife to assist regain balance.   Gait Comments toeing in, narrow BOS, steppage gait, decreased dflex, difficulty stopping quickly with turns and change of directions     Standardized Balance Assessment   Standardized Balance Assessment Timed Up and Go Test     Timed Up and Go Test   Normal TUG (seconds) 9.81   Manual TUG (seconds) 8.68   Cognitive TUG (seconds) 11.78   TUG Comments Wife reports gait during TUG is fast compared to normal     High Level Balance   High Level Balance Comments MiniBest test score: 13/28.  Significant LOB with posterior push and release, requiring therapist assist to recover after multiple small steps.  Possible decreased vestibular system use due to pt not able to stand>4 seconds EC on foam feet together.          Mini-BESTest: Balance Evaluation Systems Test  2005-2013 [employer]. All rights reserved. ________________________________________________________________________________________Anticipatory_________Subscore___4__/6 1. SIT TO STAND Instruction: "Cross your arms across your chest. Try not to use your hands unless you must.Do not let your legs lean against the back of  the chair when you stand. Please stand up now." X(2) Normal: Comes to stand without use of hands and stabilizes independently. (1) Moderate: Comes to stand WITH use of hands on first attempt. (0) Severe: Unable to stand up from chair without assistance, OR needs several attempts with use of hands. 2. RISE TO TOES Instruction: "Place your feet shoulder width apart. Place your hands on your hips. Try to rise as high as you can onto your toes. I will count out loud to 3 seconds. Try to hold this pose for at least 3 seconds. Look straight ahead. Rise now." (2) Normal: Stable for 3 s with maximum height. X(1) Moderate: Heels up, but not full range (smaller than when holding hands), OR noticeable instability for 3 s. (0) Severe: < 3 s. 3. STAND ON ONE LEG Instruction: "Look straight ahead. Keep your hands on your hips. Lift your leg off of the ground behind you without touching or  resting your raised leg upon your other standing leg. Stay standing on one leg as long as you can. Look straight ahead. Lift now." Left: Time in Seconds Trial 1:__0.85 sec___Trial 2:__1.88 sec___ (2) Normal: 20 s. X(1) Moderate: < 20 s. (0) Severe: Unable. Right: Time in Seconds Trial 1:__1.05 sec___Trial 2:__2.37 sec___ (2) Normal: 20 s. X(1) Moderate: < 20 s. (0) Severe: Unable To score each side separately use the trial with the longest time. To calculate the sub-score and total score use the side [left or right] with the lowest numerical score [i.e. the worse side]. ______________________________________________________________________________________Reactive Postural Control___________Subscore:____1_/6 4. COMPENSATORY STEPPING CORRECTION- FORWARD Instruction: "Stand with your feet shoulder width apart, arms at your sides. Lean forward against my hands beyond your forward limits. When I let go, do whatever is necessary, including taking a step, to avoid a fall." (2) Normal: Recovers independently with a single,  large step (second realignment step is allowed). X(1) Moderate: More than one step used to recover equilibrium. (0) Severe: No step, OR would fall if not caught, OR falls spontaneously. 5. COMPENSATORY STEPPING CORRECTION- BACKWARD Instruction: "Stand with your feet shoulder width apart, arms at your sides. Lean backward against my hands beyond your backward limits. When I let go, do whatever is necessary, including taking a step, to avoid a fall." (2) Normal: Recovers independently with a single, large step. (1) Moderate: More than one step used to recover equilibrium. X(0) Severe: No step, OR would fall if not caught, OR falls spontaneously. 6. COMPENSATORY STEPPING CORRECTION- LATERAL Instruction: "Stand with your feet together, arms down at your sides. Lean into my hand beyond your sideways limit. When I let go, do whatever is necessary, including taking a step, to avoid a fall." Left (2) Normal: Recovers independently with 1 step (crossover or lateral OK). (1) Moderate: Several steps to recover equilibrium. X(0) Severe: Falls, or cannot step. Right (2) Normal: Recovers independently with 1 step (crossover or lateral OK). X(1) Moderate: Several steps to recover equilibrium. (0) Severe: Falls, or cannot step. Use the side with the lowest score to calculate sub-score and total score. ____________________________________________________________________________________Sensory Orientation_____________Subscore:_______4__/6 7. STANCE (FEET TOGETHER); EYES OPEN, FIRM SURFACE Instruction: "Place your hands on your hips. Place your feet together until almost touching. Look straight ahead. Be as stable and still as possible, until I say stop." Time in seconds:________ X(2) Normal: 30 s. (1) Moderate: < 30 s. (0) Severe: Unable. 8. STANCE (FEET TOGETHER); EYES CLOSED, FOAM SURFACE Instruction: "Step onto the foam. Place your hands on your hips. Place your feet together until almost touching.  Be as stable and still as possible, until I say stop. I will start timing when you close your eyes." Time in seconds:________ (2) Normal: 30 s. (1) Moderate: < 30 s. X(0) Severe: Unable. 9. INCLINE- EYES CLOSED Instruction: "Step onto the incline ramp. Please stand on the incline ramp with your toes toward the top. Place your feet shoulder width apart and have your arms down at your sides. I will start timing when you close your eyes." Time in seconds:________ X(2) Normal: Stands independently 30 s and aligns with gravity. (1) Moderate: Stands independently <30 s OR aligns with surface. (0) Severe: Unable. _________________________________________________________________________________________Dynamic Gait ______Subscore_____4___/10 10. CHANGE IN GAIT SPEED Instruction: "Begin walking at your normal speed, when I tell you 'fast', walk as fast as you can. When I say 'slow', walk very slowly." (2) Normal: Significantly changes walking speed without imbalance. X(1) Moderate: Unable to change walking speed or signs of imbalance. (0) Severe:  Unable to achieve significant change in walking speed AND signs of imbalance. [place] - HORIZONTAL Instruction: "Begin walking at your normal speed, when I say "right", turn your head and look to the right. When I say "left" turn your head and look to the left. Try to keep yourself walking in a straight line." (2) Normal: performs head turns with no change in gait speed and good balance. X(1) Moderate: performs head turns with reduction in gait speed. (0) Severe: performs head turns with imbalance. 12. WALK WITH PIVOT TURNS Instruction: "Begin walking at your normal speed. When I tell you to 'turn and stop', turn as quickly as you can, face the opposite direction, and stop. After the turn, your feet should be close together." (2) Normal: Turns with feet close FAST (< 3 steps) with good balance. (1) Moderate: Turns with feet close SLOW  (>4 steps) with good balance. X(0) Severe: Cannot turn with feet close at any speed without imbalance. 13. STEP OVER OBSTACLES Instruction: "Begin walking at your normal speed. When you get to the box, step over it, not around it and keep walking." (2) Normal: Able to step over box with minimal change of gait speed and with good balance. X(1) Moderate: Steps over box but touches box OR displays cautious behavior by slowing gait. (0) Severe: Unable to step over box OR steps around box. 14. TIMED UP & GO WITH DUAL TASK [3 METER WALK] Instruction TUG: "When I say 'Go', stand up from chair, walk at your normal speed across the tape on the floor, turn around, and come back to sit in the chair." Instruction TUG with Dual Task: "Count backwards by threes starting at ___. When I say 'Go', stand up from chair, walk at your normal speed across the tape on the floor, turn around, and come back to sit in the chair. Continue counting backwards the entire time." TUG: ________seconds; Dual Task TUG: ________seconds (2) Normal: No noticeable change in sitting, standing or walking while backward counting when compared to TUG without Dual Task. X(1) Moderate: Dual Task affects either counting OR walking (>10%) when compared to the TUG without Dual Task. (0) Severe: Stops counting while walking OR stops walking while counting. When scoring item 14, if subject's gait speed slows more than 10% between the TUG without and with a Dual Task the score should be decreased by a point. TOTAL SCORE: ______13__/28                     PT Short Term Goals - 12/26/16 1916      PT SHORT TERM GOAL #1   Title Pt will perform HEP for balance, strength/stretching, gait with wife's supervision.  TARGET 01/24/17   Time 4   Period Weeks   Status New     PT SHORT TERM GOAL #2   Title Pt will perform at least 8 of 10 reps of sit<>stand transfers, from 18" surfaces or lower, with no UE support, no posterior LOB,  for improved efficiency and safety with transfers.   Time 4   Period Weeks   Status New     PT SHORT TERM GOAL #3   Title Pt will improve MiniBESTest score to at least 18/28 for decreased fall risk.   Time 4   Period Weeks   Status New     PT SHORT TERM GOAL #4   Title Pt will verbalize understanding of tips to reduce freezing and festination of gait and turns.  Time 4   Period Weeks   Status New           PT Long Term Goals - 01-13-2017 1918      PT LONG TERM GOAL #1   Title Pt will verbalize understanding of fall prevention in home environment.  TARGET 02/24/17   Time 8   Period Weeks   Status New     PT LONG TERM GOAL #2   Title Pt will report at least 50% improvement in car transfers.   Time 8   Period Weeks   Status New     PT LONG TERM GOAL #3   Title Pt will improve MiniBESTest score to at least 22/28 for decreased fall risk.   Time 8   Period Weeks   Status New     PT LONG TERM GOAL #4   Title Pt will improve TUG and TUG cognitive score to within <10% difference, for improved dual tasking with gait.   Time 8   Period Weeks   Status New     PT LONG TERM GOAL #5   Title Pt will perform floor>stand transfer, modified independently, for improved safety with fall recovery.   Time 8   Period Weeks   Status New               Plan - 13-Jan-2017 1905    Clinical Impression Statement Pt is a 61 year old male with history of Parkinson's disease, who presents to OP PT with reports of at least 15-20 falls in the past 6 months.  Pt is followed by neurology at [facility], and pt reports he is in a time of medication "tweaking."  He presents with decreased balance, postural instability, abnormal posture, rigidity, decreased timing and coordination with gait, decreased flexibility, decreased safety awareness.  Pt is at high risk per fall MiniBESTest and demonstrates difficulty with dual tasking in TUG/TUG cognitive scores. During gait, pt has near scissoring pattern-unsure  if dystonia is present to increased supination and inversion during gait pattern.  Pt goes to gym regularly as well as [employer], but has been limited due to falls.  Pt would benefit from skilled physical therapy to address the above deficits for improved functional mobility and decreased fall risk/frequency.    Rehab Potential Good   PT Frequency 2x / week   PT Duration 8 weeks  plus evaluation   PT Treatment/Interventions ADLs/Self Care Home Management;Functional mobility training;Gait training;Therapeutic activities;Therapeutic exercise;Balance training;Neuromuscular re-education;Patient/family education   PT Next Visit Plan Initiate HEP for balance, posture, lower extremity stretching-PWR! Moves in quadruped, sitting, standing in conjunction with OT.  Will need to work on turns, start/stops, change of directions, balance strategies   Consulted and Agree with Plan of Care Patient;Family member/caregiver   Family Member Consulted wife      Patient will benefit from skilled therapeutic intervention in order to improve the following deficits and impairments:  Abnormal gait, Decreased balance, Decreased safety awareness, Decreased strength, Difficulty walking, Impaired flexibility, Postural dysfunction  Visit Diagnosis: Other abnormalities of gait and mobility  Unsteadiness on feet  Abnormal posture  Other symptoms and signs involving the nervous system      G-Codes - Jan 13, 2017 1922    Functional Assessment Tool Used (Outpatient Only) MiniBESTest 13/28, TUG 9.81 sec, TUG cog 11.78 sec (>10% difference), 15-20 falls in the past 6 months, needs assist to prevent fall with posterior push and release test   Functional Limitation Mobility: Walking and moving around   Mobility:  Walking and Moving Around Current Status [phone]) At least 40 percent but less than 60 percent impaired, limited or restricted   Mobility: Walking and Moving Around Goal Status [phone]) At least 20 percent but less  than 40 percent impaired, limited or restricted       Problem List Patient Active Problem List   Diagnosis Date Noted  . 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 Acute urinary retention 11/23/2012  . BPH (benign prostatic hyperplasia) 11/23/2012    Dick Hark W. 12/26/2016, 7:24 PM  Frazier Butt., PT  [place] [address] [place], [place], [address] Phone: [phone]   Fax:  [phone]  Name: BERTIN INABINET MRN: [id number] Date of Birth: Jan 25, 1956

## 2017-01-01 ENCOUNTER — Ambulatory Visit: Payer: PPO | Admitting: Physical Therapy

## 2017-01-01 ENCOUNTER — Ambulatory Visit: Payer: PPO | Attending: Neurology | Admitting: Occupational Therapy

## 2017-01-01 DIAGNOSIS — R278 Other lack of coordination: Secondary | ICD-10-CM | POA: Insufficient documentation

## 2017-01-01 DIAGNOSIS — R2689 Other abnormalities of gait and mobility: Secondary | ICD-10-CM | POA: Insufficient documentation

## 2017-01-01 DIAGNOSIS — R29898 Other symptoms and signs involving the musculoskeletal system: Secondary | ICD-10-CM | POA: Diagnosis not present

## 2017-01-01 DIAGNOSIS — R41842 Visuospatial deficit: Secondary | ICD-10-CM | POA: Insufficient documentation

## 2017-01-01 DIAGNOSIS — R2681 Unsteadiness on feet: Secondary | ICD-10-CM | POA: Insufficient documentation

## 2017-01-01 DIAGNOSIS — R269 Unspecified abnormalities of gait and mobility: Secondary | ICD-10-CM | POA: Insufficient documentation

## 2017-01-01 DIAGNOSIS — R293 Abnormal posture: Secondary | ICD-10-CM | POA: Insufficient documentation

## 2017-01-01 DIAGNOSIS — R41840 Attention and concentration deficit: Secondary | ICD-10-CM | POA: Insufficient documentation

## 2017-01-01 DIAGNOSIS — R41844 Frontal lobe and executive function deficit: Secondary | ICD-10-CM | POA: Diagnosis not present

## 2017-01-01 DIAGNOSIS — R29818 Other symptoms and signs involving the nervous system: Secondary | ICD-10-CM | POA: Diagnosis not present

## 2017-01-01 NOTE — Patient Instructions (Signed)
Coordination Exercises  Perform the following exercises for 20 minutes 1 times per day. Perform with both hand(s). Perform using big movements.   Flipping Cards: Place deck of cards on the table. Flip cards over by opening your hand big to grasp and then turn your palm up big.  Deal cards: Hold 1/2 or whole deck in your hand. Use thumb to push card off top of deck with one big push.  Rotate ball with fingertips: Pick up with fingers/thumb and move as much as you can with each turn/movement (clockwise and counter-clockwise).  Pick up coins and place in coin bank or container: Pick up with big, intentional movements. Do not drag coin to the edge.  Pick up coins and stack one at a time: Pick up with big, intentional movements. Do not drag coin to the edge. (5-10 in a stack)  Pick up 5-10 coins one at a time and hold in palm. Then, move coins from palm to fingertips one at time and place in coin bank/container.  Perform "Flicks"/hand stretches (PWR! Hands): Close hands then flick out your fingers with focus on opening hands, pulling wrists back, and extending elbows like you are pushing. 

## 2017-01-01 NOTE — Therapy (Signed)
[facility] [address] [place], [place], [address] Phone: [phone]   Fax:  [phone]  Occupational Therapy Treatment  Patient Details  Name: Adam Cordova MRN: [id number] Date of Birth: Jan 08, 1956 Referring Provider: Gwendolyn Fill, PA  Encounter Date: 01/01/2017      OT End of Session - 01/01/17 1301    Visit Number 2   Number of Visits 17   Date for OT Re-Evaluation 02/24/17   Authorization Type [employer], no visit limit, no auth req; G-code needed   Authorization - Visit Number 2   Authorization - Number of Visits 10   OT Start Time [phone]   OT Stop Time 1015   OT Time Calculation (min) 39 min   Activity Tolerance Patient tolerated treatment well   Behavior During Therapy [facility] for tasks assessed/performed      Past Medical History:  Diagnosis Date  . BPH (benign prostatic hypertrophy) with urinary retention   . Foley catheter in place   . Parkinson disease [facility])    neurologist--  dr Richardean Chimera  in [place]  . UTI (urinary tract infection)     Past Surgical History:  Procedure Laterality Date  . TRANSURETHRAL RESECTION OF PROSTATE N/A 11/23/2012   Procedure: TRANSURETHRAL RESECTION OF THE PROSTATE WITH GYRUS INSTRUMENTS;  Surgeon: Bernestine Amass, MD;  Location: [facility];  Service: Urology;  Laterality: N/A;    There were no vitals filed for this visit.      Subjective Assessment - 01/01/17 1257    Subjective  Pt reports he is headed out of town   Pertinent History Parkinson's disease diagnosed in 2013 (tremors since 2011), BPH, hx of multiple falls (previous injuries include rib fx and punctured lung)   Limitations fall risk, cognitive deficits, occasional diplopia   Patient Stated Goals improve balance, improve writing, improve coordination, improve eating   Currently in Pain? No/denies                              OT Education - 01/01/17 1024     Education provided Yes   Education Details coordination HEP, recommendation pt does not wear flip flops due to balance   Person(s) Educated Patient   Methods Explanation;Demonstration;Verbal cues;Handout   Comprehension Verbalized understanding;Returned demonstration;Verbal cues required          OT Short Term Goals - 01/01/17 1257      OT SHORT TERM GOAL #1   Title Pt will be independent with PD-specific HEP.--check STGs 01/24/17   Time 4   Period Weeks   Status New     OT SHORT TERM GOAL #2   Title Pt will improve coordination for ADLs as shown by improving time on 9-hole peg test by at least 4sec with dominant RUE.   Baseline R-47.69sec, L-34.69sec   Time 4   Period Weeks   Status New     OT SHORT TERM GOAL #3   Title Pt will be able to don/doff jacket safely mod I (PPT#4 with jacket) in 90sec or less.   Baseline unable in standing due to difficulty with orientation (gown) and LOB x3 with min A to recover   Time 4   Period Weeks   Status New     OT SHORT TERM GOAL #4   Title Pt/wife will report incr ability to perform bilateral coordinated ADL tasks as shown by fastening/unfastening 3 buttons in less than 8min  utilizing strategies.   Time 4   Period Weeks   Status New     OT SHORT TERM GOAL #5   Title Pt will demonstrate improved bilateral UE function for ADLs as evidenced by increasing box / blocks by 3 blocks bilaterally.   Baseline RUE 49 blocks, LUE 42 blocks   Time 4   Period Weeks   Status New     OT SHORT TERM GOAL #6   Title Pt will write 3 sentences with at least 90% legibility and only min decr in size.   Time 4   Period Weeks   Status New           OT Long Term Goals - 12/26/16 1258      OT LONG TERM GOAL #1   Title Pt/caregiver will verbalize understanding of adaptive strategies/AE for ADLs (including strategies for eating, writing, bed mobility, dressing, carrying objects, IADLs).--check LTGs 02/24/17   Time 8   Period Weeks   Status New      OT LONG TERM GOAL #2   Title Pt will improve coordination for ADLs as shown by improving time on 9-hole peg test by at least 8sec with dominant RUE.   Baseline R-47.69sec   Time 8   Period Weeks   Status New     OT LONG TERM GOAL #3   Title Pt will report incr ease with eating (getting food on utensil, less spills, and incr ease with cutting).   Time 8   Period Weeks   Status New     OT LONG TERM GOAL #4   Title Pt will be able to don/doff jacket safely mod I (PPT#4 with jacket) in 60sec or less.   Time 8   Period Weeks   Status New     OT LONG TERM GOAL #5   Title Pt will write 3 sentences with at least 100% legibility and only min decr in size.   Time 8   Period Weeks   Status New     Long Term Additional Goals   Additional Long Term Goals Yes     OT LONG TERM GOAL #6   Title Pt will write 3 sentences with at least 100% legibility and only min decr in size.   Time 8   Period Weeks   Status New     OT LONG TERM GOAL #7   Title Assess Standing Functional Reach and set goal prn   Period Weeks   Status New               Plan - 01/01/17 1259    Clinical Impression Statement Pt presents to occupational therapy with significant cognitive, visuospatial and coordination deficits. He can benefit from review of coordiantion activities as he requires mod/ max v.c and repetition.   Rehab Potential Good   Clinical Impairments Affecting Rehab Potential cognitive deficits, severity of deficits   OT Frequency 2x / week   OT Duration 8 weeks   OT Treatment/Interventions Self-care/ADL training;Moist Heat;Fluidtherapy;DME and/or AE instruction;Splinting;Patient/family education;Balance training;Therapeutic exercises;Therapeutic activities;Therapeutic exercise;Ultrasound;Neuromuscular education;Functional Mobility Training;Passive range of motion;Cognitive remediation/compensation;Visual/perceptual remediation/compensation;Manual Therapy;Energy conservation;Parrafin;Cryotherapy    Plan further assess vision and standing functional reach, review coordination HEP   OT Home Exercise Plan issued coordination HEP   Consulted and Agree with Plan of Care Patient      Patient will benefit from skilled therapeutic intervention in order to improve the following deficits and impairments:  Decreased cognition, Decreased mobility, Impaired perceived functional ability, Impaired vision/preception,  Impaired UE functional use, Decreased knowledge of use of DME, Decreased balance, Decreased activity tolerance, Impaired tone, Improper spinal/pelvic alignment, Decreased safety awareness, Decreased coordination, Decreased range of motion, Abnormal gait (bradykinesia/hypokinesia, timing deficits)  Visit Diagnosis: Other symptoms and signs involving the nervous system  Other symptoms and signs involving the musculoskeletal system  Other lack of coordination  Visuospatial deficit    Problem List Patient Active Problem List   Diagnosis Date Noted  . 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 Acute urinary retention 11/23/2012  . BPH (benign prostatic hyperplasia) 11/23/2012    RINE,KATHRYN 01/01/2017, 1:02 PM  [facility] [address] [place], [place], [address] Phone: [phone]   Fax:  [phone]  Name: BLUE WINTHER MRN: [id number] Date of Birth: 09/20/1955

## 2017-01-20 ENCOUNTER — Ambulatory Visit: Payer: PPO | Admitting: Physical Therapy

## 2017-01-20 ENCOUNTER — Ambulatory Visit: Payer: PPO | Admitting: Occupational Therapy

## 2017-01-20 DIAGNOSIS — R41840 Attention and concentration deficit: Secondary | ICD-10-CM

## 2017-01-20 DIAGNOSIS — R41844 Frontal lobe and executive function deficit: Secondary | ICD-10-CM

## 2017-01-20 DIAGNOSIS — R2681 Unsteadiness on feet: Secondary | ICD-10-CM

## 2017-01-20 DIAGNOSIS — R293 Abnormal posture: Secondary | ICD-10-CM

## 2017-01-20 DIAGNOSIS — R29818 Other symptoms and signs involving the nervous system: Secondary | ICD-10-CM | POA: Diagnosis not present

## 2017-01-20 DIAGNOSIS — R2689 Other abnormalities of gait and mobility: Secondary | ICD-10-CM

## 2017-01-20 DIAGNOSIS — R278 Other lack of coordination: Secondary | ICD-10-CM

## 2017-01-20 DIAGNOSIS — R29898 Other symptoms and signs involving the musculoskeletal system: Secondary | ICD-10-CM

## 2017-01-20 DIAGNOSIS — R41842 Visuospatial deficit: Secondary | ICD-10-CM

## 2017-01-20 DIAGNOSIS — R269 Unspecified abnormalities of gait and mobility: Secondary | ICD-10-CM

## 2017-01-20 NOTE — Therapy (Signed)
[facility] [address] [place], [place], [address] Phone: [phone]   Fax:  [phone]  Occupational Therapy Treatment  Patient Details  Name: Adam Cordova MRN: [id number] Date of Birth: 1955/11/14 Referring Provider: Gwendolyn Fill, PA  Encounter Date: 01/20/2017      OT End of Session - 01/20/17 0856    Visit Number 3   Number of Visits 17   Date for OT Re-Evaluation 02/24/17   Authorization Type [employer], no visit limit, no auth req; G-code needed   Authorization - Visit Number 3   Authorization - Number of Visits 10   OT Start Time [phone]   OT Stop Time 0936   OT Time Calculation (min) 43 min   Activity Tolerance Patient tolerated treatment well   Behavior During Therapy [facility] for tasks assessed/performed      Past Medical History:  Diagnosis Date  . BPH (benign prostatic hypertrophy) with urinary retention   . Foley catheter in place   . Parkinson disease [facility])    neurologist--  dr Richardean Chimera  in [place]  . UTI (urinary tract infection)     Past Surgical History:  Procedure Laterality Date  . TRANSURETHRAL RESECTION OF PROSTATE N/A 11/23/2012   Procedure: TRANSURETHRAL RESECTION OF THE PROSTATE WITH GYRUS INSTRUMENTS;  Surgeon: Bernestine Amass, MD;  Location: [facility];  Service: Urology;  Laterality: N/A;    There were no vitals filed for this visit.      Subjective Assessment - 01/20/17 0855    Subjective  Pt reports a couple of falls/stumbles (turning)   Pertinent History Parkinson's disease diagnosed in 2013 (tremors since 2011), BPH, hx of multiple falls (previous injuries include rib fx and punctured lung)   Limitations fall risk, cognitive deficits, occasional diplopia, decr superior visual tracking   Patient Stated Goals improve balance, improve writing, improve coordination, improve eating   Currently in Pain? No/denies            [facility] OT Assessment -  01/20/17 0001      Vision Assessment   Vision Assessment Vision impaired  _ to be further tested in functional context   Tracking/Visual Pursuits --  decr smoothness of vertical tracking     Observation/Other Assessments   Standing Functional Reach Test R-9", L-8"      Began education regarding how use of HEP can prevent future complications how it  addresses  PD-specific deficits.                    OT Education - 01/20/17 0916    Education Details PWR! Hands (basic 4); Reviewed Coordination HEP   Person(s) Educated Patient   Methods Explanation;Demonstration;Verbal cues;Handout;Tactile cues   Comprehension Verbalized understanding;Returned demonstration;Verbal cues required  mod cues for PWR! hands, particularly step, mod cues for incr amplitude with coordination          OT Short Term Goals - 01/20/17 0946      OT SHORT TERM GOAL #1   Title Pt will be independent with PD-specific HEP.--check STGs 02/08/17 (extended due to being seen for decr frequency)   Time 4   Period Weeks   Status New     OT SHORT TERM GOAL #2   Title Pt will improve coordination for ADLs as shown by improving time on 9-hole peg test by at least 4sec with dominant RUE.   Baseline R-47.69sec, L-34.69sec   Time 4   Period Weeks   Status New  OT SHORT TERM GOAL #3   Title Pt will be able to don/doff jacket safely mod I (PPT#4 with jacket) in 90sec or less.   Baseline unable in standing due to difficulty with orientation (gown) and LOB x3 with min A to recover   Time 4   Period Weeks   Status New     OT SHORT TERM GOAL #4   Title Pt/wife will report incr ability to perform bilateral coordinated ADL tasks as shown by fastening/unfastening 3 buttons in less than 25min utilizing strategies.   Time 4   Period Weeks   Status New     OT SHORT TERM GOAL #5   Title Pt will demonstrate improved bilateral UE function for ADLs as evidenced by increasing box / blocks by 3 blocks  bilaterally.   Baseline RUE 49 blocks, LUE 42 blocks   Time 4   Period Weeks   Status New     OT SHORT TERM GOAL #6   Title Pt will write 3 sentences with at least 90% legibility and only min decr in size.   Time 4   Period Weeks   Status New           OT Long Term Goals - 01/20/17 0954      OT LONG TERM GOAL #1   Title Pt/caregiver will verbalize understanding of adaptive strategies/AE for ADLs (including strategies for eating, writing, bed mobility, dressing, carrying objects, IADLs).--check LTGs 02/24/17   Time 8   Period Weeks   Status New     OT LONG TERM GOAL #2   Title Pt will improve coordination for ADLs as shown by improving time on 9-hole peg test by at least 8sec with dominant RUE.   Baseline R-47.69sec   Time 8   Period Weeks   Status New     OT LONG TERM GOAL #3   Title Pt will report incr ease with eating (getting food on utensil, less spills, and incr ease with cutting).   Time 8   Period Weeks   Status New     OT LONG TERM GOAL #4   Title Pt will be able to don/doff jacket safely mod I (PPT#4 with jacket) in 60sec or less.   Time 8   Period Weeks   Status New     OT LONG TERM GOAL #5   Title Pt will write 3 sentences with at least 100% legibility and only min decr in size.   Time 8   Period Weeks   Status New     OT LONG TERM GOAL #6   Title Pt will write 3 sentences with at least 100% legibility and only min decr in size.   Time 8   Period Weeks   Status New     OT LONG TERM GOAL #7   Title Pt will be able to reach at least 10" bilaterall for improved balance/decr fall risk for ADLs/IADLs.   Time 8   Period Weeks   Status New               Plan - 01/20/17 6384    Clinical Impression Statement Cognitive deficits and possible motor planning deficits affect pt's ability to perform initial HEP.  Pt will benefit from reinforcement of any education provided.  Extend goals due to pt not being seen for frequency (out of town).   Rehab  Potential Good   Clinical Impairments Affecting Rehab Potential cognitive deficits, severity of deficits, visual deficits  OT Frequency 2x / week   OT Duration 8 weeks   OT Treatment/Interventions Self-care/ADL training;Moist Heat;Fluidtherapy;DME and/or AE instruction;Splinting;Patient/family education;Balance training;Therapeutic exercises;Therapeutic activities;Therapeutic exercise;Ultrasound;Neuromuscular education;Functional Mobility Training;Passive range of motion;Cognitive remediation/compensation;Visual/perceptual remediation/compensation;Manual Therapy;Energy conservation;Parrafin;Cryotherapy   Plan PWR! hands HEP review, functional movements with focus on safety/large amplitude (buttoning, jacket, writing); Community resources (including POP, music therapy, cycling class, PD 201 class)   OT Home Exercise Plan Education provided:  issued coordination HEP; PWR! hands (basic 4)   Consulted and Agree with Plan of Care Patient      Patient will benefit from skilled therapeutic intervention in order to improve the following deficits and impairments:  Decreased cognition, Decreased mobility, Impaired perceived functional ability, Impaired vision/preception, Impaired UE functional use, Decreased knowledge of use of DME, Decreased balance, Decreased activity tolerance, Impaired tone, Improper spinal/pelvic alignment, Decreased safety awareness, Decreased coordination, Decreased range of motion, Abnormal gait (bradykinesia/hypokinesia, timing deficits)  Visit Diagnosis: Other symptoms and signs involving the nervous system  Other symptoms and signs involving the musculoskeletal system  Other lack of coordination  Visuospatial deficit  Unsteadiness on feet  Abnormal posture  Other abnormalities of gait and mobility  Frontal lobe and executive function deficit  Attention and concentration deficit  Abnormality of gait    Problem List Patient Active Problem List   Diagnosis Date  Noted  . 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 Acute urinary retention 11/23/2012  . BPH (benign prostatic hyperplasia) 11/23/2012    [facility] 01/20/2017, 9:55 AM  [facility] [address] [place], [place], [address] Phone: [phone]   Fax:  [phone]  Name: Adam Cordova MRN: [id number] Date of Birth: 11/03/1955   Vianne Bulls, OTR/L [facility] [address] [place], [place]  [address] [phone] phone [phone] 01/20/17 9:55 AM

## 2017-01-20 NOTE — Patient Instructions (Signed)

## 2017-01-22 ENCOUNTER — Ambulatory Visit: Payer: PPO | Admitting: Occupational Therapy

## 2017-01-22 ENCOUNTER — Ambulatory Visit: Payer: PPO | Admitting: Physical Therapy

## 2017-01-22 DIAGNOSIS — R2681 Unsteadiness on feet: Secondary | ICD-10-CM

## 2017-01-22 DIAGNOSIS — R29898 Other symptoms and signs involving the musculoskeletal system: Secondary | ICD-10-CM

## 2017-01-22 DIAGNOSIS — R293 Abnormal posture: Secondary | ICD-10-CM

## 2017-01-22 DIAGNOSIS — R41844 Frontal lobe and executive function deficit: Secondary | ICD-10-CM

## 2017-01-22 DIAGNOSIS — R41842 Visuospatial deficit: Secondary | ICD-10-CM

## 2017-01-22 DIAGNOSIS — R2689 Other abnormalities of gait and mobility: Secondary | ICD-10-CM

## 2017-01-22 DIAGNOSIS — R29818 Other symptoms and signs involving the nervous system: Secondary | ICD-10-CM | POA: Diagnosis not present

## 2017-01-22 DIAGNOSIS — R278 Other lack of coordination: Secondary | ICD-10-CM

## 2017-01-22 NOTE — Therapy (Signed)
[facility] [address] [place], [place], [address] Phone: [phone]   Fax:  [phone]  Occupational Therapy Treatment  Patient Details  Name: Adam Cordova MRN: [id number] Date of Birth: Feb 03, 1956 Referring Provider: Gwendolyn Fill, PA  Encounter Date: 01/22/2017      OT End of Session - 01/22/17 1431    Visit Number 4   Number of Visits 17   Date for OT Re-Evaluation 02/24/17   Authorization Type [employer], no visit limit, no auth req; G-code needed   Authorization - Visit Number 4   Authorization - Number of Visits 10   OT Start Time 0804   OT Stop Time 0845   OT Time Calculation (min) 41 min   Activity Tolerance Patient tolerated treatment well   Behavior During Therapy [facility] for tasks assessed/performed      Past Medical History:  Diagnosis Date  . BPH (benign prostatic hypertrophy) with urinary retention   . Foley catheter in place   . Parkinson disease [facility])    neurologist--  dr Richardean Chimera  in [place]  . UTI (urinary tract infection)     Past Surgical History:  Procedure Laterality Date  . TRANSURETHRAL RESECTION OF PROSTATE N/A 11/23/2012   Procedure: TRANSURETHRAL RESECTION OF THE PROSTATE WITH GYRUS INSTRUMENTS;  Surgeon: Bernestine Amass, MD;  Location: [facility];  Service: Urology;  Laterality: N/A;    There were no vitals filed for this visit.      Subjective Assessment - 01/22/17 1431    Pertinent History Parkinson's disease diagnosed in 2013 (tremors since 2011), BPH, hx of multiple falls (previous injuries include rib fx and punctured lung)   Limitations fall risk, cognitive deficits, occasional diplopia, decr superior visual tracking   Patient Stated Goals improve balance, improve writing, improve coordination, improve eating   Currently in Pain? No/denies           Arm bike x 5 mins level 1 for conditioning, pt was able to maintain 40 rpm Education  regarding fastening buttons with big movements/ adapted technique, despite cueing and repetition, pt was not successful. Therapist also attempted use of buttonhook with hand over hand assist, however this appears to be too challenging as well. Supine PWR! up, rock and twist with max v.c. And demonstration for techniques, large amplitude movements. 10 reps each Reviewed flipping and dealing playing cards with large amplitude movements, mo- max v.c and demonstration for big movements. Therapist requested pt's wife attends therapy to assist with education.                [facility]) - 01/22/17 3790    PWR! Up x 10   PWR! Rock x 10    PWR! Twist x 5 reps each side   PWR! Step x 5 reps each side   Comments Pt requires verbal and tactile cues for technique, for pacing, deliberate movement patterns           Balance Exercises - 01/22/17 1156      Balance Exercises: Standing   Stepping Strategy Anterior;Posterior;Lateral;UE support;10 reps  At counter, frequent redirection to pacing, deliberate movt   Heel Raises Limitations x 10   Toe Raise Limitations x 10  UE support at counter-2 posterior LOB   Other Standing Exercises Resisted standing for balance perturbation work:  in posterior, posterior-lateral R and L directions, multiple reps, with UE support at bars and frequent verbal cues for activation of postural and core musculature to maintain  balance.             OT Short Term Goals - 01/20/17 0946      OT SHORT TERM GOAL #1   Title Pt will be independent with PD-specific HEP.--check STGs 02/08/17 (extended due to being seen for decr frequency)   Time 4   Period Weeks   Status New     OT SHORT TERM GOAL #2   Title Pt will improve coordination for ADLs as shown by improving time on 9-hole peg test by at least 4sec with dominant RUE.   Baseline R-47.69sec, L-34.69sec   Time 4   Period Weeks   Status New     OT SHORT TERM GOAL #3   Title Pt will be able to don/doff  jacket safely mod I (PPT#4 with jacket) in 90sec or less.   Baseline unable in standing due to difficulty with orientation (gown) and LOB x3 with min A to recover   Time 4   Period Weeks   Status New     OT SHORT TERM GOAL #4   Title Pt/wife will report incr ability to perform bilateral coordinated ADL tasks as shown by fastening/unfastening 3 buttons in less than 58min utilizing strategies.   Time 4   Period Weeks   Status New     OT SHORT TERM GOAL #5   Title Pt will demonstrate improved bilateral UE function for ADLs as evidenced by increasing box / blocks by 3 blocks bilaterally.   Baseline RUE 49 blocks, LUE 42 blocks   Time 4   Period Weeks   Status New     OT SHORT TERM GOAL #6   Title Pt will write 3 sentences with at least 90% legibility and only min decr in size.   Time 4   Period Weeks   Status New           OT Long Term Goals - 01/20/17 0954      OT LONG TERM GOAL #1   Title Pt/caregiver will verbalize understanding of adaptive strategies/AE for ADLs (including strategies for eating, writing, bed mobility, dressing, carrying objects, IADLs).--check LTGs 02/24/17   Time 8   Period Weeks   Status New     OT LONG TERM GOAL #2   Title Pt will improve coordination for ADLs as shown by improving time on 9-hole peg test by at least 8sec with dominant RUE.   Baseline R-47.69sec   Time 8   Period Weeks   Status New     OT LONG TERM GOAL #3   Title Pt will report incr ease with eating (getting food on utensil, less spills, and incr ease with cutting).   Time 8   Period Weeks   Status New     OT LONG TERM GOAL #4   Title Pt will be able to don/doff jacket safely mod I (PPT#4 with jacket) in 60sec or less.   Time 8   Period Weeks   Status New     OT LONG TERM GOAL #5   Title Pt will write 3 sentences with at least 100% legibility and only min decr in size.   Time 8   Period Weeks   Status New     OT LONG TERM GOAL #6   Title Pt will write 3 sentences with  at least 100% legibility and only min decr in size.   Time 8   Period Weeks   Status New     OT LONG TERM  GOAL #7   Title Pt will be able to reach at least 10" bilaterall for improved balance/decr fall risk for ADLs/IADLs.   Time 8   Period Weeks   Status New               Plan - 01/22/17 1432    Clinical Impression Statement Pt is progressing slowly towards goals. His cognitive deficits impact his ability to carryover HEP and functional activities. Therapist recommended that pt has his wife attend to therapy in the future to assist with carryover.   Rehab Potential Fair   Clinical Impairments Affecting Rehab Potential cognitive deficits, severity of deficits, visual deficits   OT Frequency 2x / week   OT Duration 8 weeks   Plan functional activities with emphasis on safety(don jacket, writing, community resources(cycle, POP, PD201, music therapy)   OT Home Exercise Plan Education provided:  issued coordination HEP; PWR! hands (basic 4)   Consulted and Agree with Plan of Care Patient      Patient will benefit from skilled therapeutic intervention in order to improve the following deficits and impairments:  Decreased cognition, Decreased mobility, Impaired perceived functional ability, Impaired vision/preception, Impaired UE functional use, Decreased knowledge of use of DME, Decreased balance, Decreased activity tolerance, Impaired tone, Improper spinal/pelvic alignment, Decreased safety awareness, Decreased coordination, Decreased range of motion, Abnormal gait  Visit Diagnosis: Other symptoms and signs involving the nervous system  Other symptoms and signs involving the musculoskeletal system  Other lack of coordination  Visuospatial deficit  Frontal lobe and executive function deficit  Abnormal posture    Problem List Patient Active Problem List   Diagnosis Date Noted  . 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 Acute urinary retention 11/23/2012  . BPH (benign prostatic  hyperplasia) 11/23/2012    RINE,KATHRYN 01/22/2017, 2:34 PM  [facility] [address] [place], [place], [address] Phone: [phone]   Fax:  [phone]  Name: Adam Cordova MRN: [id number] Date of Birth: 02/05/1956

## 2017-01-22 NOTE — Therapy (Signed)
[facility] [address] [place], [place], [address] Phone: [phone]   Fax:  [phone]  Physical Therapy Treatment  Patient Details  Name: Adam Cordova MRN: [id number] Date of Birth: [birth year] Referring Provider: [name], PA  Encounter Date: 01/22/2017      PT End of Session - 01/22/17 1203    Visit Number 1   Number of Visits 17   Date for PT Re-Evaluation 02/24/17   Authorization Type [employer] -G[employer] every 10th visit   PT Start Time 0852   PT Stop Time 0932   PT Time Calculation (min) 40 min   Equipment Utilized During Treatment Gait belt   Activity Tolerance Patient tolerated treatment well   Behavior During Therapy [facility] for tasks assessed/performed      Past Medical History:  Diagnosis Date  . BPH (benign prostatic hypertrophy) with urinary retention   . Foley catheter in place   . Parkinson disease [facility])    neurologist--  dr Richardean Chimera  in [place]  . UTI (urinary tract infection)     Past Surgical History:  Procedure Laterality Date  . TRANSURETHRAL RESECTION OF PROSTATE N/A 11/23/2012   Procedure: TRANSURETHRAL RESECTION OF THE PROSTATE WITH GYRUS INSTRUMENTS;  Surgeon: Bernestine Amass, MD;  Location: [facility];  Service: Urology;  Laterality: N/A;    There were no vitals filed for this visit.      Subjective Assessment - 01/22/17 0855    Subjective Reports being out of town at [employer].  Reports that he has fallen 3-4 times since PT eval 12/26/16.  Wants to focus on balance.   Pertinent History Parkinson's disease   Patient Stated Goals Pt's goals for therapy are to "dramatically improve my sense of balance."   Currently in Pain? No/denies                         [facility] Adult PT Treatment/Exercise - 01/22/17 0001      Ambulation/Gait   Ambulation/Gait Yes   Ambulation/Gait Assistance 4: Min guard   Ambulation Distance (Feet) 700 Feet   Assistive device None   Gait Pattern Step-through pattern;Decreased arm swing - right;Decreased arm swing - left;Decreased dorsiflexion - right;Decreased dorsiflexion - left;Scissoring;Decreased trunk rotation;Narrow base of support   Ambulation Surface Level;Indoor   Gait Comments Starting, stopping with gait, multiple reps.  With quick stops, pt takes multiple steps and needs therapist assist at times to regain balance.  Practiced stagger stance position upon quick stops for improved balance.  Gait with conversation tasks, pt has narrowed BOS and 2 episodes of LOB.  Pt ambulates with arms folded at chest versus swinging.           PWR [facility]) - 01/22/17 3976    PWR! Up x 10   PWR! Rock x 10    PWR! Twist x 5 reps each side   PWR! Step x 5 reps each side   Comments Pt requires verbal and tactile cues for technique, for pacing, deliberate movement patterns           Balance Exercises - 01/22/17 1156      Balance Exercises: Standing   Stepping Strategy Anterior;Posterior;Lateral;UE support;10 reps  At counter, frequent redirection to pacing, deliberate movt   Heel Raises Limitations x 10   Toe Raise Limitations x 10  UE support at counter-2 posterior LOB   Other Standing Exercises Resisted standing for balance perturbation work:  in posterior,  posterior-lateral R and L directions, multiple reps, with UE support at bars and frequent verbal cues for activation of postural and core musculature to maintain balance.             PT Short Term Goals - 01/22/17 1207      PT SHORT TERM GOAL #1   Title Pt will perform HEP for balance, strength/stretching, gait with wife's supervision.  TARGET 01/24/17   Time 4   Period Weeks   Status On-going     PT SHORT TERM GOAL #2   Title Pt will perform at least 8 of 10 reps of sit<>stand transfers, from 18" surfaces or lower, with no UE support, no posterior LOB, for improved efficiency and safety with transfers.   Time 4   Period Weeks    Status On-going     PT SHORT TERM GOAL #3   Title Pt will improve MiniBESTest score to at least 18/28 for decreased fall risk.   Time 4   Period Weeks   Status On-going     PT SHORT TERM GOAL #4   Title Pt will verbalize understanding of tips to reduce freezing and festination of gait and turns.   Time 4   Period Weeks   Status On-going           PT Long Term Goals - 12/26/16 1918      PT LONG TERM GOAL #1   Title Pt will verbalize understanding of fall prevention in home environment.  TARGET 02/24/17   Time 8   Period Weeks   Status New     PT LONG TERM GOAL #2   Title Pt will report at least 50% improvement in car transfers.   Time 8   Period Weeks   Status New     PT LONG TERM GOAL #3   Title Pt will improve MiniBESTest score to at least 22/28 for decreased fall risk.   Time 8   Period Weeks   Status New     PT LONG TERM GOAL #4   Title Pt will improve TUG and TUG cognitive score to within <10% difference, for improved dual tasking with gait.   Time 8   Period Weeks   Status New     PT LONG TERM GOAL #5   Title Pt will perform floor>stand transfer, modified independently, for improved safety with fall recovery.   Time 8   Period Weeks   Status New               Plan - 01/22/17 1204    Clinical Impression Statement Pt presents to OP PT today for first treatment since eval on 12/26/16, due to being out of town at [employer].  Attempted quadruped PWR! Moves to address amplitude and control of movement patterns as well as core stability work, and pt will need additional practice due to frequency of cues needed during exercises today.  Pt has difficulty sequencing resisted standing balance activities and gait with conversation causes additional LOB.  Pt will continue to benefit from further skilled PT to address posture, balance, gait activities.   Rehab Potential Good   PT Frequency 2x / week   PT Duration 8 weeks  plus evaluation   PT  Treatment/Interventions ADLs/Self Care Home Management;Functional mobility training;Gait training;Therapeutic activities;Therapeutic exercise;Balance training;Neuromuscular re-education;Patient/family education   PT Next Visit Plan PWR! Moves in [place] to HEP; Work on resisted standing and balance perturbatoins, turns, start/stops, changes of direction and balance strategy work  Consulted and Agree with Plan of Care Patient      Patient will benefit from skilled therapeutic intervention in order to improve the following deficits and impairments:  Abnormal gait, Decreased balance, Decreased safety awareness, Decreased strength, Difficulty walking, Impaired flexibility, Postural dysfunction  Visit Diagnosis: Other abnormalities of gait and mobility  Unsteadiness on feet     Problem List Patient Active Problem List   Diagnosis Date Noted  . 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 Acute urinary retention 11/23/2012  . BPH (benign prostatic hyperplasia) 11/23/2012    Remi Rester W. 01/22/2017, 12:09 PM Frazier Butt., PT [facility] [address] [place], [place], [address] Phone: [phone]   Fax:  [phone]  Name: Adam Cordova MRN: [id number] Date of Birth: 27-Dec-1955

## 2017-01-29 ENCOUNTER — Ambulatory Visit: Payer: PPO | Admitting: Occupational Therapy

## 2017-01-29 ENCOUNTER — Ambulatory Visit: Payer: PPO | Admitting: Physical Therapy

## 2017-01-29 DIAGNOSIS — R29818 Other symptoms and signs involving the nervous system: Secondary | ICD-10-CM

## 2017-01-29 DIAGNOSIS — R41842 Visuospatial deficit: Secondary | ICD-10-CM

## 2017-01-29 DIAGNOSIS — R2689 Other abnormalities of gait and mobility: Secondary | ICD-10-CM

## 2017-01-29 DIAGNOSIS — R29898 Other symptoms and signs involving the musculoskeletal system: Secondary | ICD-10-CM

## 2017-01-29 DIAGNOSIS — R2681 Unsteadiness on feet: Secondary | ICD-10-CM

## 2017-01-29 DIAGNOSIS — R278 Other lack of coordination: Secondary | ICD-10-CM

## 2017-01-29 NOTE — Patient Instructions (Addendum)
   To be performed once per day, 20 reps each

## 2017-01-29 NOTE — Therapy (Signed)
[facility] [address] [place], [place], [address] Phone: [phone]   Fax:  [phone]  Physical Therapy Treatment  Patient Details  Name: Adam Cordova MRN: [id number] Date of Birth: [birth year] Referring Provider: [name], PA  Encounter Date: 01/29/2017      PT End of Session - 01/29/17 2143    Visit Number 3  previous visit was visit 2   Number of Visits 17   Date for PT Re-Evaluation 02/24/17   Authorization Type [employer] -G[employer] every 10th visit   PT Start Time 1020   PT Stop Time 1059   PT Time Calculation (min) 39 min   Equipment Utilized During Treatment Gait belt   Activity Tolerance Patient tolerated treatment well   Behavior During Therapy [facility] for tasks assessed/performed      Past Medical History:  Diagnosis Date  . BPH (benign prostatic hypertrophy) with urinary retention   . Foley catheter in place   . Parkinson disease [facility])    neurologist--  dr Richardean Chimera  in [place]  . UTI (urinary tract infection)     Past Surgical History:  Procedure Laterality Date  . TRANSURETHRAL RESECTION OF PROSTATE N/A 11/23/2012   Procedure: TRANSURETHRAL RESECTION OF THE PROSTATE WITH GYRUS INSTRUMENTS;  Surgeon: Bernestine Amass, MD;  Location: [facility];  Service: Urology;  Laterality: N/A;    There were no vitals filed for this visit.      Subjective Assessment - 01/29/17 1021    Subjective No changes since last visit; no falls reported.   Pertinent History Parkinson's disease   Patient Stated Goals Pt's goals for therapy are to "dramatically improve my sense of balance."   Currently in Pain? No/denies                            PWR [facility]) - 01/29/17 1022    PWR! exercises Moves in [place]! Up x 10   PWR! Rock x 10 each side   PWR! Twist x10 each side   PWR! Step x 10 reps each side   Comments Performed on mat on floor, needs verbal and visual  cues for proper technique.  Better able to perform today versus last visit.          Balance Exercises - 01/29/17 2128      Balance Exercises: Standing   Wall Bumps Hip   Wall Bumps-Hips Anterior/posterior;10 reps  UE support at counter   Stepping Strategy Anterior;Posterior;Lateral;UE support;10 reps  Used 2" block as cue for forward step and weight-shift   Retro Gait Upper extremity support;5 reps  At counter with forward/back walk   Heel Raises Limitations x 10   Toe Raise Limitations x 10   Other Standing Exercises Blocked practice of forward/back walking at counter, with varied distances, quick start/stops, with pt needing cues for widened BOS and for stagger stance BOS for improved weightshfiting.  Pt needs repeate cues throughout session for widened BOS and for increased, slowed step length in posterior direction.  On his own, pt takes multiple small, uncontrolled steps in posterior direction, requiring therapist assist to regain balance.           PT Education - 01/29/17 2142    Education provided Yes   Education Details HEP-quadruped PWR! Moves   Person(s) Educated Patient   Methods Explanation;Demonstration;Verbal cues;Tactile cues;Handout   Comprehension Verbalized understanding;Returned demonstration;Verbal cues required;Need further instruction  PT Short Term Goals - 01/22/17 1207      PT SHORT TERM GOAL #1   Title Pt will perform HEP for balance, strength/stretching, gait with wife's supervision.  TARGET 01/24/17   Time 4   Period Weeks   Status On-going     PT SHORT TERM GOAL #2   Title Pt will perform at least 8 of 10 reps of sit<>stand transfers, from 18" surfaces or lower, with no UE support, no posterior LOB, for improved efficiency and safety with transfers.   Time 4   Period Weeks   Status On-going     PT SHORT TERM GOAL #3   Title Pt will improve MiniBESTest score to at least 18/28 for decreased fall risk.   Time 4   Period Weeks    Status On-going     PT SHORT TERM GOAL #4   Title Pt will verbalize understanding of tips to reduce freezing and festination of gait and turns.   Time 4   Period Weeks   Status On-going           PT Long Term Goals - 12/26/16 1918      PT LONG TERM GOAL #1   Title Pt will verbalize understanding of fall prevention in home environment.  TARGET 02/24/17   Time 8   Period Weeks   Status New     PT LONG TERM GOAL #2   Title Pt will report at least 50% improvement in car transfers.   Time 8   Period Weeks   Status New     PT LONG TERM GOAL #3   Title Pt will improve MiniBESTest score to at least 22/28 for decreased fall risk.   Time 8   Period Weeks   Status New     PT LONG TERM GOAL #4   Title Pt will improve TUG and TUG cognitive score to within <10% difference, for improved dual tasking with gait.   Time 8   Period Weeks   Status New     PT LONG TERM GOAL #5   Title Pt will perform floor>stand transfer, modified independently, for improved safety with fall recovery.   Time 8   Period Weeks   Status New               Plan - 01/29/17 2144    Clinical Impression Statement Initiated HEP with quadruped PWR! Moves today, to address amplitude and control of movement and core stability.  Pt able to perform with improved control today versus last visit.  Pt continues to have difficulty with coordination and sequence of step strategy and directional changes.  Pt improves forward/back walking with cues for slowed, deliberate pacing.  Pt will continue to benefit from skilled PT to address posture, balance, gait.   Rehab Potential Good   PT Frequency 2x / week   PT Duration 8 weeks  plus evaluation   PT Treatment/Interventions ADLs/Self Care Home Management;Functional mobility training;Gait training;Therapeutic activities;Therapeutic exercise;Balance training;Neuromuscular re-education;Patient/family education   PT Next Visit Plan REview PWR! Moves in quadruped; Work on  resisted standing and balance perturbatoins, turns, start/stops, changes of direction and balance strategy work-Check STGs?   Consulted and Agree with Plan of Care Patient      Patient will benefit from skilled therapeutic intervention in order to improve the following deficits and impairments:  Abnormal gait, Decreased balance, Decreased safety awareness, Decreased strength, Difficulty walking, Impaired flexibility, Postural dysfunction  Visit Diagnosis: Other abnormalities of gait and mobility  Unsteadiness on feet     Problem List Patient Active Problem List   Diagnosis Date Noted  . 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 Acute urinary retention 11/23/2012  . BPH (benign prostatic hyperplasia) 11/23/2012    Erinn Mendosa W. 01/29/2017, 9:49 PM  Frazier Butt., PT   [facility] [address] [place], [place], [address] Phone: [phone]   Fax:  [phone]  Name: Adam Cordova MRN: [id number] Date of Birth: 10/31/1955

## 2017-01-29 NOTE — Therapy (Signed)
[facility] [address] [place], [place], [address] Phone: [phone]   Fax:  [phone]  Occupational Therapy Treatment  Patient Details  Name: Adam Cordova MRN: [id number] Date of Birth: [birth year] Referring Provider: Gwendolyn Fill, PA  Encounter Date: 01/29/2017      OT End of Session - 01/29/17 1230    Visit Number 5   Number of Visits 17   Date for OT Re-Evaluation 02/24/17   Authorization Type [employer], no visit limit, no auth req; G-code needed   Authorization - Visit Number 5   Authorization - Number of Visits 10   OT Start Time 0935   OT Stop Time 1015   OT Time Calculation (min) 40 min   Activity Tolerance Patient tolerated treatment well   Behavior During Therapy [facility] for tasks assessed/performed      Past Medical History:  Diagnosis Date  . BPH (benign prostatic hypertrophy) with urinary retention   . Foley catheter in place   . Parkinson disease [facility])    neurologist--  dr Richardean Chimera  in [place]  . UTI (urinary tract infection)     Past Surgical History:  Procedure Laterality Date  . TRANSURETHRAL RESECTION OF PROSTATE N/A 11/23/2012   Procedure: TRANSURETHRAL RESECTION OF THE PROSTATE WITH GYRUS INSTRUMENTS;  Surgeon: Bernestine Amass, MD;  Location: [facility];  Service: Urology;  Laterality: N/A;    There were no vitals filed for this visit.      Subjective Assessment - 01/29/17 0936    Subjective  Pt denies falls   Pertinent History Parkinson's disease diagnosed in 2013 (tremors since 2011), BPH, hx of multiple falls (previous injuries include rib fx and punctured lung)   Limitations fall risk, cognitive deficits, occasional diplopia, decr superior visual tracking   Patient Stated Goals improve balance, improve writing, improve coordination, improve eating   Currently in Pain? No/denies                    Arm bike x 5 mins level 1 for  conditioning, pt was able to maintain 40 rpm Dynamic step and functional reach with mod v.c/ facilitation for larger amplitude movements Pt reports his wife does most of the driving, therapsit reinforce that this is a good idea due to delayed response time related to PD.          OT Education - 01/29/17 1233    Education provided Yes   Education Details Handwriting strategies, donning / doffing jacket with adapted techniques, flipping/ dealing cards with larger amplitude movements mod v.c/ demonstration due to cognitive deficits   Person(s) Educated Patient   Methods Explanation;Demonstration;Verbal cues;Handout;Tactile cues   Comprehension Verbalized understanding;Returned demonstration;Verbal cues required          OT Short Term Goals - 01/20/17 0946      OT SHORT TERM GOAL #1   Title Pt will be independent with PD-specific HEP.--check STGs 02/08/17 (extended due to being seen for decr frequency)   Time 4   Period Weeks   Status New     OT SHORT TERM GOAL #2   Title Pt will improve coordination for ADLs as shown by improving time on 9-hole peg test by at least 4sec with dominant RUE.   Baseline R-47.69sec, L-34.69sec   Time 4   Period Weeks   Status New     OT SHORT TERM GOAL #3   Title Pt will be able to don/doff jacket safely mod I (  PPT#4 with jacket) in 90sec or less.   Baseline unable in standing due to difficulty with orientation (gown) and LOB x3 with min A to recover   Time 4   Period Weeks   Status New     OT SHORT TERM GOAL #4   Title Pt/wife will report incr ability to perform bilateral coordinated ADL tasks as shown by fastening/unfastening 3 buttons in less than 72min utilizing strategies.   Time 4   Period Weeks   Status New     OT SHORT TERM GOAL #5   Title Pt will demonstrate improved bilateral UE function for ADLs as evidenced by increasing box / blocks by 3 blocks bilaterally.   Baseline RUE 49 blocks, LUE 42 blocks   Time 4   Period Weeks    Status New     OT SHORT TERM GOAL #6   Title Pt will write 3 sentences with at least 90% legibility and only min decr in size.   Time 4   Period Weeks   Status New           OT Long Term Goals - 01/20/17 0954      OT LONG TERM GOAL #1   Title Pt/caregiver will verbalize understanding of adaptive strategies/AE for ADLs (including strategies for eating, writing, bed mobility, dressing, carrying objects, IADLs).--check LTGs 02/24/17   Time 8   Period Weeks   Status New     OT LONG TERM GOAL #2   Title Pt will improve coordination for ADLs as shown by improving time on 9-hole peg test by at least 8sec with dominant RUE.   Baseline R-47.69sec   Time 8   Period Weeks   Status New     OT LONG TERM GOAL #3   Title Pt will report incr ease with eating (getting food on utensil, less spills, and incr ease with cutting).   Time 8   Period Weeks   Status New     OT LONG TERM GOAL #4   Title Pt will be able to don/doff jacket safely mod I (PPT#4 with jacket) in 60sec or less.   Time 8   Period Weeks   Status New     OT LONG TERM GOAL #5   Title Pt will write 3 sentences with at least 100% legibility and only min decr in size.   Time 8   Period Weeks   Status New     OT LONG TERM GOAL #6   Title Pt will write 3 sentences with at least 100% legibility and only min decr in size.   Time 8   Period Weeks   Status New     OT LONG TERM GOAL #7   Title Pt will be able to reach at least 10" bilaterall for improved balance/decr fall risk for ADLs/IADLs.   Time 8   Period Weeks   Status New               Plan - 01/29/17 1231    Clinical Impression Statement Pt is progressing slowly towards goals limited by severity of cognitive deficts. Therpist has encouraged pt to have his wife attend therapy for improved carryover.   Rehab Potential Fair   Current Impairments/barriers affecting progress: cognitive deficits, severity of deficits, visual deficits   OT Frequency 2x / week    OT Duration 8 weeks   OT Treatment/Interventions Self-care/ADL training;Moist Heat;Fluidtherapy;DME and/or AE instruction;Splinting;Patient/family education;Balance training;Therapeutic exercises;Therapeutic activities;Therapeutic exercise;Ultrasound;Neuromuscular education;Functional Mobility Training;Passive range of motion;Cognitive  remediation/compensation;Visual/perceptual remediation/compensation;Manual Therapy;Energy conservation;Parrafin;Cryotherapy   OT Home Exercise Plan Education provided:  issued coordination HEP; PWR! hands (basic 4)   Consulted and Agree with Plan of Care Patient   Plan community resources, dynamic step and reach      Patient will benefit from skilled therapeutic intervention in order to improve the following deficits and impairments:  Decreased cognition, Decreased mobility, Impaired perceived functional ability, Impaired vision/preception, Impaired UE functional use, Decreased knowledge of use of DME, Decreased balance, Decreased activity tolerance, Impaired tone, Improper spinal/pelvic alignment, Decreased safety awareness, Decreased coordination, Decreased range of motion, Abnormal gait  Visit Diagnosis: Other symptoms and signs involving the nervous system  Other symptoms and signs involving the musculoskeletal system  Other lack of coordination  Visuospatial deficit    Problem List Patient Active Problem List   Diagnosis Date Noted  . 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 Acute urinary retention 11/23/2012  . BPH (benign prostatic hyperplasia) 11/23/2012    Cashmere Harmes 01/29/2017, 12:35 PM  [facility] [address] [place], [place], [address] Phone: [phone]   Fax:  [phone]  Name: Adam Cordova MRN: [id number] Date of Birth: [birth year]

## 2017-01-31 ENCOUNTER — Ambulatory Visit: Payer: PPO | Attending: Neurology | Admitting: Physical Therapy

## 2017-01-31 ENCOUNTER — Ambulatory Visit: Payer: PPO | Admitting: Occupational Therapy

## 2017-01-31 DIAGNOSIS — R41842 Visuospatial deficit: Secondary | ICD-10-CM

## 2017-01-31 DIAGNOSIS — R29818 Other symptoms and signs involving the nervous system: Secondary | ICD-10-CM | POA: Insufficient documentation

## 2017-01-31 DIAGNOSIS — R278 Other lack of coordination: Secondary | ICD-10-CM | POA: Diagnosis not present

## 2017-01-31 DIAGNOSIS — R29898 Other symptoms and signs involving the musculoskeletal system: Secondary | ICD-10-CM | POA: Insufficient documentation

## 2017-01-31 DIAGNOSIS — R293 Abnormal posture: Secondary | ICD-10-CM | POA: Insufficient documentation

## 2017-01-31 DIAGNOSIS — R41844 Frontal lobe and executive function deficit: Secondary | ICD-10-CM | POA: Diagnosis not present

## 2017-01-31 DIAGNOSIS — R2689 Other abnormalities of gait and mobility: Secondary | ICD-10-CM | POA: Insufficient documentation

## 2017-01-31 DIAGNOSIS — R41840 Attention and concentration deficit: Secondary | ICD-10-CM | POA: Insufficient documentation

## 2017-01-31 DIAGNOSIS — R2681 Unsteadiness on feet: Secondary | ICD-10-CM | POA: Diagnosis not present

## 2017-01-31 NOTE — Therapy (Signed)
[facility] [address] [place], [place], [address] Phone: [phone]   Fax:  [phone]  Occupational Therapy Treatment  Patient Details  Name: Adam Cordova MRN: [id number] Date of Birth: 03/15/1956 Referring Provider: Gwendolyn Fill, PA  Encounter Date: 01/31/2017      OT End of Session - 01/31/17 1441    Visit Number 6   Number of Visits 17   Date for OT Re-Evaluation 02/24/17   Authorization Type [employer], no visit limit, no auth req; G-code needed   Authorization - Visit Number 6   Authorization - Number of Visits 10   OT Start Time 0935   OT Stop Time 1015   OT Time Calculation (min) 40 min   Activity Tolerance Patient tolerated treatment well   Behavior During Therapy [facility] for tasks assessed/performed      Past Medical History:  Diagnosis Date  . BPH (benign prostatic hypertrophy) with urinary retention   . Foley catheter in place   . Parkinson disease [facility])    neurologist--  dr Richardean Chimera  in [place]  . UTI (urinary tract infection)     Past Surgical History:  Procedure Laterality Date  . TRANSURETHRAL RESECTION OF PROSTATE N/A 11/23/2012   Procedure: TRANSURETHRAL RESECTION OF THE PROSTATE WITH GYRUS INSTRUMENTS;  Surgeon: Bernestine Amass, MD;  Location: [facility];  Service: Urology;  Laterality: N/A;    There were no vitals filed for this visit.      Subjective Assessment - 01/31/17 0940    Pertinent History (P)  Parkinson's disease diagnosed in 2013 (tremors since 2011), BPH, hx of multiple falls (previous injuries include rib fx and punctured lung)   Limitations (P)  fall risk, cognitive deficits, occasional diplopia, decr superior visual tracking   Patient Stated Goals (P)  improve balance, improve writing, improve coordination, improve eating                              OT Education - 01/31/17 1556    Education provided Yes   Education  Details PWR! moves basic 4 in seated 10 reps each with mod v.c, adapted strategies for cutting food, and donning jacket, recommenadtions to avoid UB weightliting at gym, recommendation that pt does not drive due to cognitive deficits and decreased reaction time.   Person(s) Educated Patient;Spouse   Methods Explanation;Demonstration;Verbal cues;Handout   Comprehension Verbalized understanding;Returned demonstration;Verbal cues required          OT Short Term Goals - 01/20/17 0946      OT SHORT TERM GOAL #1   Title Pt will be independent with PD-specific HEP.--check STGs 02/08/17 (extended due to being seen for decr frequency)   Time 4   Period Weeks   Status New     OT SHORT TERM GOAL #2   Title Pt will improve coordination for ADLs as shown by improving time on 9-hole peg test by at least 4sec with dominant RUE.   Baseline R-47.69sec, L-34.69sec   Time 4   Period Weeks   Status New     OT SHORT TERM GOAL #3   Title Pt will be able to don/doff jacket safely mod I (PPT#4 with jacket) in 90sec or less.   Baseline unable in standing due to difficulty with orientation (gown) and LOB x3 with min A to recover   Time 4   Period Weeks   Status New  OT SHORT TERM GOAL #4   Title Pt/wife will report incr ability to perform bilateral coordinated ADL tasks as shown by fastening/unfastening 3 buttons in less than 77min utilizing strategies.   Time 4   Period Weeks   Status New     OT SHORT TERM GOAL #5   Title Pt will demonstrate improved bilateral UE function for ADLs as evidenced by increasing box / blocks by 3 blocks bilaterally.   Baseline RUE 49 blocks, LUE 42 blocks   Time 4   Period Weeks   Status New     OT SHORT TERM GOAL #6   Title Pt will write 3 sentences with at least 90% legibility and only min decr in size.   Time 4   Period Weeks   Status New           OT Long Term Goals - 01/20/17 0954      OT LONG TERM GOAL #1   Title Pt/caregiver will verbalize  understanding of adaptive strategies/AE for ADLs (including strategies for eating, writing, bed mobility, dressing, carrying objects, IADLs).--check LTGs 02/24/17   Time 8   Period Weeks   Status New     OT LONG TERM GOAL #2   Title Pt will improve coordination for ADLs as shown by improving time on 9-hole peg test by at least 8sec with dominant RUE.   Baseline R-47.69sec   Time 8   Period Weeks   Status New     OT LONG TERM GOAL #3   Title Pt will report incr ease with eating (getting food on utensil, less spills, and incr ease with cutting).   Time 8   Period Weeks   Status New     OT LONG TERM GOAL #4   Title Pt will be able to don/doff jacket safely mod I (PPT#4 with jacket) in 60sec or less.   Time 8   Period Weeks   Status New     OT LONG TERM GOAL #5   Title Pt will write 3 sentences with at least 100% legibility and only min decr in size.   Time 8   Period Weeks   Status New     OT LONG TERM GOAL #6   Title Pt will write 3 sentences with at least 100% legibility and only min decr in size.   Time 8   Period Weeks   Status New     OT LONG TERM GOAL #7   Title Pt will be able to reach at least 10" bilaterall for improved balance/decr fall risk for ADLs/IADLs.   Time 8   Period Weeks   Status New               Plan - 01/31/17 1559    Clinical Impression Statement Pt's wife attended therapy today. Pt/ and wifde verbalize understanding of beginning adapted strategies for ADLs.   Rehab Potential Fair   Current Impairments/barriers affecting progress: cognitive deficits, severity of deficits, visual deficits   OT Frequency 2x / week   OT Duration 8 weeks   OT Treatment/Interventions Self-care/ADL training;Moist Heat;Fluidtherapy;DME and/or AE instruction;Splinting;Patient/family education;Balance training;Therapeutic exercises;Therapeutic activities;Therapeutic exercise;Ultrasound;Neuromuscular education;Functional Mobility Training;Passive range of  motion;Cognitive remediation/compensation;Visual/perceptual remediation/compensation;Manual Therapy;Energy conservation;Parrafin;Cryotherapy   Plan dynamic step and reach, infor on PD201   OT Home Exercise Plan Education provided:  issued coordination HEP; PWR! hands (basic 4), PWR! seated basic 4, adapted strateiges for cutting food and donning jacket   Consulted and Agree with Plan of Care Patient  Patient will benefit from skilled therapeutic intervention in order to improve the following deficits and impairments:  Decreased cognition, Decreased mobility, Impaired perceived functional ability, Impaired vision/preception, Impaired UE functional use, Decreased knowledge of use of DME, Decreased balance, Decreased activity tolerance, Impaired tone, Improper spinal/pelvic alignment, Decreased safety awareness, Decreased coordination, Decreased range of motion, Abnormal gait  Visit Diagnosis: Other symptoms and signs involving the nervous system  Other symptoms and signs involving the musculoskeletal system  Other lack of coordination  Visuospatial deficit  Frontal lobe and executive function deficit    Problem List Patient Active Problem List   Diagnosis Date Noted  . 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 Acute urinary retention 11/23/2012  . BPH (benign prostatic hyperplasia) 11/23/2012    Deletha Jaffee 01/31/2017, 4:01 PM Theone Murdoch, OTR/L [phone] Phone: [phone] 4:02 PM 06/01/18[facility] [address] [place], [place], [address] Phone: [phone]   Fax:  [phone]  Name: Adam Cordova MRN: [id number] Date of Birth: 1956-01-21

## 2017-01-31 NOTE — Patient Instructions (Signed)
   Check out PWR! Moves You Tube-All 4's  -demo the HEP PWR! Moves exercises   Tips to reduce freezing episodes/control your movement patterns with standing or walking:  1. Stand tall with your feet wide, so that you can rock and weight shift through your hips. 2. Don't try to fight the freeze: if you begin taking slower, faster, smaller steps, STOP, get your posture tall, and RESET your posture and balance.  Take a deep breath before taking the BIG step to start again. 3. March in place, with high knee stepping, to get started walking again. 4. Use auditory cues:  Count out loud, think of a familiar tune or song or cadence, use pocket metronome, to use rhythm to get started walking again. 5. Use visual cues:  Use a line to step over, use laser pointer line to step over, (using BIG steps) to start walking again. 6. Use visual targets to keep your posture tall (look ahead and focus on an object or target at eye level). 7. As you approach where your destination with walking, count your steps out loud and/or focus on your target with your eyes until you are fully there. 8. Use appropriate assistive device, as advised by your physical therapist to assist with taking longer, consistent steps.

## 2017-01-31 NOTE — Therapy (Deleted)
[facility] [address] [place], [place], [address] Phone: [phone]   Fax:  [phone]  Patient Details  Name: AMIT LEECE MRN: [id number] Date of Birth: 04-30-1956 Referring Provider:  [name], MD  Encounter Date: 01/31/2017   Theone Murdoch 01/31/2017, 4:01 PM  [facility] [address] [place], [place], [address] Phone: [phone]   Fax:  [phone]

## 2017-01-31 NOTE — Therapy (Signed)
[facility] [address] [place], [place], [address] Phone: [phone]   Fax:  [phone]  Physical Therapy Treatment  Patient Details  Name: Adam Cordova MRN: [id number] Date of Birth: Dec 17, 1955 Referring Provider: [name], PA  Encounter Date: 01/31/2017      PT End of Session - 01/31/17 1213    Visit Number 4   Number of Visits 17   Date for PT Re-Evaluation 02/24/17   Authorization Type [employer] every 10th visit   PT Start Time 1016   PT Stop Time 1100   PT Time Calculation (min) 44 min   Equipment Utilized During Treatment Gait belt   Activity Tolerance Patient tolerated treatment well   Behavior During Therapy [facility] for tasks assessed/performed      Past Medical History:  Diagnosis Date  . BPH (benign prostatic hypertrophy) with urinary retention   . Foley catheter in place   . Parkinson disease [facility])    neurologist--  dr Richardean Chimera  in [place]  . UTI (urinary tract infection)     Past Surgical History:  Procedure Laterality Date  . TRANSURETHRAL RESECTION OF PROSTATE N/A 11/23/2012   Procedure: TRANSURETHRAL RESECTION OF THE PROSTATE WITH GYRUS INSTRUMENTS;  Surgeon: Bernestine Amass, MD;  Location: [facility];  Service: Urology;  Laterality: N/A;    There were no vitals filed for this visit.      Subjective Assessment - 01/31/17 1016    Subjective (P)  Feel like it's been a bit of an uneven start this morning.   Patient is accompained by: (P)  --  wife, [name]   Pertinent History (P)  Parkinson's disease   Patient Stated Goals (P)  Pt's goals for therapy are to "dramatically improve my sense of balance."   Currently in Pain? (P)  No/denies                         [facility] Adult PT Treatment/Exercise - 01/31/17 1106      Transfers   Transfers Sit to Stand;Stand to Sit   Sit to Stand 5: Supervision;4: Min guard   Sit to Stand Details Tactile cues  for weight shifting;Verbal cues for sequencing;Verbal cues for technique  cues for widened/staggered BOS and weight-shift upon stand   Stand to Sit 5: Supervision;4: Min guard   Transfer Cueing Upon standing, cues provided for patient to weightshift, then take large steps to initiate gait.   Comments Pt has strong posterior lean with initial attempts at standing.  Cues for widened and stagger stance BOS for weightshifting upon standing.     High Level Balance   High Level Balance Comments Gait activities: In gym area, forward/back walking with focus on deliberate, large step length, pacing of movement pattern.  Therapist provides auditory cues for deliberate pace of backward stepping (multiple reps 10-20 ft); side stepping L and R 10-15 steps, with therapist providing counting cues for pacing of movement.  Added cognitive component for quick change of directions R and L, and pt has difficulty sequencing change of directions.  Variable direction stepping forward, back, L or R , all with min guard assistance.     Self-Care   Self-Care Other Self-Care Comments   Other Self-Care Comments  Discussed and practiced tips to reduce freezing episodes/increase control of movement with transitions and change of directions.  Particularly, practiced weightshifting upon standing, benefit of stopping and resetting posture as soon as he begins to  lose balance.  Discussed with wife ways to optimally cue patient, especially with "set" posture to take quick step and crouch to reset posture.     Practiced turns to change direction, turns to sit, using cues for widened BOS and marching to clear feet and deliberately complete turn sequence.  With gait activities, pt needs frequent cues for upright posture and to avoid looking directly downward at ground.      [facility]) - 01/31/17 1105    PWR! exercises Moves in [place]! Up x 20   PWR! Rock x 10 each side   PWR! Twist x 10 each side   PWR! Step x 10 each  side   Comments Performed on mat on floor, needs minimal verbal and visual cues.  Provided info on PWR! Moves You Tube for reinforcement of correct performance at home.             PT Education - 01/31/17 1212    Education provided Yes   Education Details PWR! Moves You Tube for HEP reinforcement; tips to reduce freezing episodes with standing and walking; ways for wife to optimally cue patient to reinforce what is practiced in therapy   Person(s) Educated Patient;Spouse   Methods Explanation;Demonstration;Verbal cues;Handout   Comprehension Verbalized understanding;Returned demonstration;Verbal cues required;Tactile cues required;Need further instruction          PT Short Term Goals - 01/22/17 1207      PT SHORT TERM GOAL #1   Title Pt will perform HEP for balance, strength/stretching, gait with wife's supervision.  TARGET 01/24/17   Time 4   Period Weeks   Status On-going     PT SHORT TERM GOAL #2   Title Pt will perform at least 8 of 10 reps of sit<>stand transfers, from 18" surfaces or lower, with no UE support, no posterior LOB, for improved efficiency and safety with transfers.   Time 4   Period Weeks   Status On-going     PT SHORT TERM GOAL #3   Title Pt will improve MiniBESTest score to at least 18/28 for decreased fall risk.   Time 4   Period Weeks   Status On-going     PT SHORT TERM GOAL #4   Title Pt will verbalize understanding of tips to reduce freezing and festination of gait and turns.   Time 4   Period Weeks   Status On-going           PT Long Term Goals - 12/26/16 1918      PT LONG TERM GOAL #1   Title Pt will verbalize understanding of fall prevention in home environment.  TARGET 02/24/17   Time 8   Period Weeks   Status New     PT LONG TERM GOAL #2   Title Pt will report at least 50% improvement in car transfers.   Time 8   Period Weeks   Status New     PT LONG TERM GOAL #3   Title Pt will improve MiniBESTest score to at least 22/28  for decreased fall risk.   Time 8   Period Weeks   Status New     PT LONG TERM GOAL #4   Title Pt will improve TUG and TUG cognitive score to within <10% difference, for improved dual tasking with gait.   Time 8   Period Weeks   Status New     PT LONG TERM GOAL #5   Title Pt will perform floor>stand transfer, modified  independently, for improved safety with fall recovery.   Time 8   Period Weeks   Status New               Plan - 01/31/17 1213    Clinical Impression Statement Reviewed HEP today, with pt demo improved quality of movement, pacing of exercises.  However, with stand from floor and initial sit<>stand, pt has posterior LOB needing therapist assist to regain balance.  Discussed and practiced with wife present ways to be more deliberate and paced/slowed with movement patterns to avoid falls. With dynamic balance and gait activities, pt has increased forward flexed posture, with eyes on ground, needing verbal and tactile cues to look ahead at visual target.  Pt will continue to benefit from skilled PT to address posture, balance and gait.   Rehab Potential Good   PT Frequency 2x / week   PT Duration 8 weeks  plus evaluation   PT Treatment/Interventions ADLs/Self Care Home Management;Functional mobility training;Gait training;Therapeutic activities;Therapeutic exercise;Balance training;Neuromuscular re-education;Patient/family education   PT Next Visit Plan Forward/back gait, side stepping, turns, starts/stops; ?PWR! Moves in standing as part of [employer]!   Consulted and Agree with Plan of Care Patient;Family member/caregiver   Family Member Consulted wife, [name]      Patient will benefit from skilled therapeutic intervention in order to improve the following deficits and impairments:  Abnormal gait, Decreased balance, Decreased safety awareness, Decreased strength, Difficulty walking, Impaired flexibility, Postural dysfunction  Visit Diagnosis: Other  abnormalities of gait and mobility  Unsteadiness on feet     Problem List Patient Active Problem List   Diagnosis Date Noted  . 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 Acute urinary retention 11/23/2012  . BPH (benign prostatic hyperplasia) 11/23/2012    Reggie Bise W. 01/31/2017, 12:18 PM Frazier Butt., PT [facility] [address] [place], [place], [address] Phone: [phone]   Fax:  [phone]  Name: Adam Cordova MRN: [id number] Date of Birth: 30-Aug-1956

## 2017-02-05 ENCOUNTER — Ambulatory Visit: Payer: PPO | Admitting: Physical Therapy

## 2017-02-05 ENCOUNTER — Ambulatory Visit: Payer: PPO | Admitting: Occupational Therapy

## 2017-02-07 ENCOUNTER — Ambulatory Visit: Payer: PPO | Admitting: Occupational Therapy

## 2017-02-07 ENCOUNTER — Ambulatory Visit: Payer: PPO | Admitting: Physical Therapy

## 2017-02-07 DIAGNOSIS — R29818 Other symptoms and signs involving the nervous system: Secondary | ICD-10-CM

## 2017-02-07 DIAGNOSIS — R41840 Attention and concentration deficit: Secondary | ICD-10-CM

## 2017-02-07 DIAGNOSIS — R2689 Other abnormalities of gait and mobility: Secondary | ICD-10-CM | POA: Diagnosis not present

## 2017-02-07 DIAGNOSIS — R2681 Unsteadiness on feet: Secondary | ICD-10-CM

## 2017-02-07 DIAGNOSIS — R278 Other lack of coordination: Secondary | ICD-10-CM

## 2017-02-07 DIAGNOSIS — R29898 Other symptoms and signs involving the musculoskeletal system: Secondary | ICD-10-CM

## 2017-02-07 DIAGNOSIS — R41842 Visuospatial deficit: Secondary | ICD-10-CM

## 2017-02-07 NOTE — Therapy (Signed)
[facility] [address] [place], [place], [address] Phone: [phone]   Fax:  [phone]  Physical Therapy Treatment  Patient Details  Name: Adam Cordova MRN: [id number] Date of Birth: 1955/10/05 Referring Provider: [name], PA  Encounter Date: 02/07/2017      PT End of Session - 02/07/17 2259    Visit Number 5   Number of Visits 17   Date for PT Re-Evaluation 02/24/17   Authorization Type [employer] -G[employer] every 10th visit   PT Start Time 1018   PT Stop Time 1101   PT Time Calculation (min) 43 min   Equipment Utilized During Treatment Gait belt   Activity Tolerance Patient tolerated treatment well   Behavior During Therapy [facility] for tasks assessed/performed      Past Medical History:  Diagnosis Date  . BPH (benign prostatic hypertrophy) with urinary retention   . Foley catheter in place   . Parkinson disease [facility])    neurologist--  dr Richardean Chimera  in [place]  . UTI (urinary tract infection)     Past Surgical History:  Procedure Laterality Date  . TRANSURETHRAL RESECTION OF PROSTATE N/A 11/23/2012   Procedure: TRANSURETHRAL RESECTION OF THE PROSTATE WITH GYRUS INSTRUMENTS;  Surgeon: Bernestine Amass, MD;  Location: [facility];  Service: Urology;  Laterality: N/A;    There were no vitals filed for this visit.      Subjective Assessment - 02/07/17 1019    Subjective Spent all day in bed earlier this week, due to being sick.  Feeling better today.   Patient is accompained by: Family member  wife, Adam Cordova   Pertinent History Parkinson's disease   Patient Stated Goals Pt's goals for therapy are to "dramatically improve my sense of balance."   Currently in Pain? No/denies                         [facility] Adult PT Treatment/Exercise - 02/07/17 0001      Transfers   Transfers Sit to Stand;Stand to Sit   Sit to Stand 5: Supervision;With upper extremity assist;Without upper  extremity assist;From chair/3-in-1   Sit to Stand Details Verbal cues for technique  Initial verbal cues provided   Stand to Sit 5: Supervision;With upper extremity assist;Without upper extremity assist;To chair/3-in-1   Number of Reps 10 reps  from 18" and 16" surface   Transfer Cueing Pt appears to have slower pace with repeated transfers today.  No episode of posterior lean with transfers during session today.   Comments Practiced scooting chair up to and away from table, 2 reps each, with cues for increased forward lean of trunk to unweight body from chair for improved ease of push/pull of chair.  Pt needs max cues and continues to pull/push chair in small motions.     Ambulation/Gait   Ambulation/Gait Yes   Ambulation/Gait Assistance 4: Min guard;5: Supervision   Ambulation/Gait Assistance Details Cues provided and pt tried visual target of looking at groun 64f ahead versus looking directly at feet.  Pt reports feeling more stable.   Ambulation Distance (Feet) 250 Feet   Assistive device None   Gait Pattern Step-through pattern;Decreased arm swing - right;Decreased arm swing - left;Decreased dorsiflexion - right;Decreased dorsiflexion - left;Decreased trunk rotation;Narrow base of support   Pre-Gait Activities Reviewed tips to reduce freezing with gait-pt reports feeling more aware of his foot position needing to be wider.  PT reviews need for slowed, deliberate  pacing, use of visual targets (practiced above), and the need to stop and reset when freezing or hastening begins to occur.     Standardized Balance Assessment   Standardized Balance Assessment Timed Up and Go Test     Timed Up and Go Test   TUG Normal TUG;Cognitive TUG   Normal TUG (seconds) 9.75   Cognitive TUG (seconds) 9.94     High Level Balance   High Level Balance Comments MiniBESTest score:  20/28-see note for full details      Mini-BESTest: Balance Evaluation Systems Test  2005-2013 [employer]. All rights reserved. ________________________________________________________________________________________Anticipatory_________Subscore__4___/6 1. SIT TO STAND Instruction: "Cross your arms across your chest. Try not to use your hands unless you must.Do not let your legs lean against the back of the chair when you stand. Please stand up now." X(2) Normal: Comes to stand without use of hands and stabilizes independently. (1) Moderate: Comes to stand WITH use of hands on first attempt. (0) Severe: Unable to stand up from chair without assistance, OR needs several attempts with use of hands. 2. RISE TO TOES Instruction: "Place your feet shoulder width apart. Place your hands on your hips. Try to rise as high as you can onto your toes. I will count out loud to 3 seconds. Try to hold this pose for at least 3 seconds. Look straight ahead. Rise now." (2) Normal: Stable for 3 s with maximum height. X(1) Moderate: Heels up, but not full range (smaller than when holding hands), OR noticeable instability for 3 s. (0) Severe: < 3 s. 3. STAND ON ONE LEG Instruction: "Look straight ahead. Keep your hands on your hips. Lift your leg off of the ground behind you without touching or resting your raised leg upon your other standing leg. Stay standing on one leg as long as you can. Look straight ahead. Lift now." Left: Time in Seconds Trial 1:__2.49___Trial 2:__2.31___ (2) Normal: 20 s. X(1) Moderate: < 20 s. (0) Severe: Unable. Right: Time in Seconds Trial 1:__8___Trial 2:__2.28___ (2) Normal: 20 s. X(1) Moderate: < 20 s. (0) Severe: Unable To score each side separately use the trial with the longest time. To calculate the sub-score and total score use the side [left or right] with the lowest numerical score [i.e. the worse side]. ______________________________________________________________________________________Reactive Postural Control___________Subscore:__4___/6 4. COMPENSATORY  STEPPING CORRECTION- FORWARD Instruction: "Stand with your feet shoulder width apart, arms at your sides. Lean forward against my hands beyond your forward limits. When I let go, do whatever is necessary, including taking a step, to avoid a fall." X(2) Normal: Recovers independently with a single, large step (second realignment step is allowed). (1) Moderate: More than one step used to recover equilibrium. (0) Severe: No step, OR would fall if not caught, OR falls spontaneously. 5. COMPENSATORY STEPPING CORRECTION- BACKWARD Instruction: "Stand with your feet shoulder width apart, arms at your sides. Lean backward against my hands beyond your backward limits. When I let go, do whatever is necessary, including taking a step, to avoid a fall." (2) Normal: Recovers independently with a single, large step. (1) Moderate: More than one step used to recover equilibrium. X(0) Severe: No step, OR would fall if not caught, OR falls spontaneously. 6. COMPENSATORY STEPPING CORRECTION- LATERAL Instruction: "Stand with your feet together, arms down at your sides. Lean into my hand beyond your sideways limit. When I let go, do whatever is necessary, including taking a step, to avoid a fall." Left X(2) Normal: Recovers independently with 1 step (  crossover or lateral OK). (1) Moderate: Several steps to recover equilibrium. (0) Severe: Falls, or cannot step. Right X(2) Normal: Recovers independently with 1 step (crossover or lateral OK). (1) Moderate: Several steps to recover equilibrium. (0) Severe: Falls, or cannot step. Use the side with the lowest score to calculate sub-score and total score. ____________________________________________________________________________________Sensory Orientation_____________Subscore:______4___/6 7. STANCE (FEET TOGETHER); EYES OPEN, FIRM SURFACE Instruction: "Place your hands on your hips. Place your feet together until almost touching. Look straight ahead. Be as  stable and still as possible, until I say stop." Time in seconds:________ (2) Normal: 30 s. (1) Moderate: < 30 s. (0) Severe: Unable. 8. STANCE (FEET TOGETHER); EYES CLOSED, FOAM SURFACE Instruction: "Step onto the foam. Place your hands on your hips. Place your feet together until almost touching. Be as stable and still as possible, until I say stop. I will start timing when you close your eyes." Time in seconds:________ X(2) Normal: 30 s. (1) Moderate: < 30 s. (0) Severe: Unable. 9. INCLINE- EYES CLOSED Instruction: "Step onto the incline ramp. Please stand on the incline ramp with your toes toward the top. Place your feet shoulder width apart and have your arms down at your sides. I will start timing when you close your eyes." Time in seconds:________ (2) Normal: Stands independently 30 s and aligns with gravity. X(1) Moderate: Stands independently <30 s OR aligns with surface. (0) Severe: Unable. _________________________________________________________________________________________Dynamic Gait ______Subscore____8____/10 10. CHANGE IN GAIT SPEED Instruction: "Begin walking at your normal speed, when I tell you 'fast', walk as fast as you can. When I say 'slow', walk very slowly." X(2) Normal: Significantly changes walking speed without imbalance. (1) Moderate: Unable to change walking speed or signs of imbalance. (0) Severe: Unable to achieve significant change in walking speed AND signs of imbalance. [place] - HORIZONTAL Instruction: "Begin walking at your normal speed, when I say "right", turn your head and look to the right. When I say "left" turn your head and look to the left. Try to keep yourself walking in a straight line." (2) Normal: performs head turns with no change in gait speed and good balance. X(1) Moderate: performs head turns with reduction in gait speed. (0) Severe: performs head turns with imbalance. 12. WALK WITH PIVOT TURNS Instruction:  "Begin walking at your normal speed. When I tell you to 'turn and stop', turn as quickly as you can, face the opposite direction, and stop. After the turn, your feet should be close together." X(2) Normal: Turns with feet close FAST (< 3 steps) with good balance. (1) Moderate: Turns with feet close SLOW (>4 steps) with good balance. (0) Severe: Cannot turn with feet close at any speed without imbalance. 13. STEP OVER OBSTACLES Instruction: "Begin walking at your normal speed. When you get to the box, step over it, not around it and keep walking." (2) Normal: Able to step over box with minimal change of gait speed and with good balance. X(1) Moderate: Steps over box but touches box OR displays cautious behavior by slowing gait. (0) Severe: Unable to step over box OR steps around box. 14. TIMED UP & GO WITH DUAL TASK [3 METER WALK] Instruction TUG: "When I say 'Go', stand up from chair, walk at your normal speed across the tape on the floor, turn around, and come back to sit in the chair." Instruction TUG with Dual Task: "Count backwards by threes starting at ___. When I say 'Go', stand up from chair, walk at your normal speed  across the tape on the floor, turn around, and come back to sit in the chair. Continue counting backwards the entire time." TUG: ________seconds; Dual Task TUG: ________seconds X(2) Normal: No noticeable change in sitting, standing or walking while backward counting when compared to TUG without Dual Task. (1) Moderate: Dual Task affects either counting OR walking (>10%) when compared to the TUG without Dual Task. (0) Severe: Stops counting while walking OR stops walking while counting. When scoring item 14, if subject's gait speed slows more than 10% between the TUG without and with a Dual Task the score should be decreased by a point. TOTAL SCORE: _____20___/28              PT Short Term Goals - 02/07/17 1020      PT SHORT TERM GOAL #1   Title Pt will  perform HEP for balance, strength/stretching, gait with wife's supervision.  TARGET 01/24/17   Time 4   Period Weeks   Status On-going     PT SHORT TERM GOAL #2   Title Pt will perform at least 8 of 10 reps of sit<>stand transfers, from 18" surfaces or lower, with no UE support, no posterior LOB, for improved efficiency and safety with transfers.   Baseline with supervision and cues 02/07/17   Time 4   Period Weeks   Status Partially Met     PT SHORT TERM GOAL #3   Title Pt will improve MiniBESTest score to at least 18/28 for decreased fall risk.   Time 4   Period Weeks   Status Achieved     PT SHORT TERM GOAL #4   Title Pt will verbalize understanding of tips to reduce freezing and festination of gait and turns.   Baseline Wife present for this education x 2 as well.   Time 4   Period Weeks   Status Achieved           PT Long Term Goals - 02/07/17 2303      PT LONG TERM GOAL #1   Title Pt will verbalize understanding of fall prevention in home environment.  TARGET 02/24/17   Time 8   Period Weeks   Status On-going     PT LONG TERM GOAL #2   Title Pt will report at least 50% improvement in car transfers.   Time 8   Period Weeks   Status On-going     PT LONG TERM GOAL #3   Title Pt will improve MiniBESTest score to at least 22/28 for decreased fall risk.   Time 8   Period Weeks   Status On-going     PT LONG TERM GOAL #4   Title Pt will improve TUG and TUG cognitive score to within <10% difference, for improved dual tasking with gait.   Time 8   Period Weeks   Status On-going     PT LONG TERM GOAL #5   Title Pt will perform floor>stand transfer, modified independently, for improved safety with fall recovery.   Time 8   Period Weeks   Status On-going               Plan - 02/07/17 2301    Clinical Impression Statement Overall, pt appears to have improved control, improved deliberate movement patterns.  Pt and wife verbalize that this continues to  fluctuate, but pt feels he is more aware of how he moves.  Checked STGs, with pt meeting STG 3 and 4.  STG 1 ongoing, 2 partially met.  Pt is to be out of town next week for neurologist visit in [place], and will return for PT to continue to address balance, gait, posture.   Rehab Potential Good   PT Frequency 2x / week   PT Duration 8 weeks  plus evaluation   PT Treatment/Interventions ADLs/Self Care Home Management;Functional mobility training;Gait training;Therapeutic activities;Therapeutic exercise;Balance training;Neuromuscular re-education;Patient/family education   PT Next Visit Plan PWR! Quadruped, PWR! Standing, stepping strategies; gait with turns/starts and stops, forward/back gait   Consulted and Agree with Plan of Care Patient;Family member/caregiver   Family Member Consulted wife, Adam Cordova      Patient will benefit from skilled therapeutic intervention in order to improve the following deficits and impairments:  Abnormal gait, Decreased balance, Decreased safety awareness, Decreased strength, Difficulty walking, Impaired flexibility, Postural dysfunction  Visit Diagnosis: Other abnormalities of gait and mobility  Unsteadiness on feet     Problem List Patient Active Problem List   Diagnosis Date Noted  . 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 Acute urinary retention 11/23/2012  . BPH (benign prostatic hyperplasia) 11/23/2012    Tationna Fullard W. 02/07/2017, 11:05 PM  Frazier Butt., PT   [facility] [place] [address] [place], [place], [address] Phone: [phone]   Fax:  [phone]  Name: Adam Cordova MRN: [id number] Date of Birth: 04/06/1956

## 2017-02-07 NOTE — Therapy (Signed)
[place] [address] [place], [place], [address] Phone: [phone]   Fax:  [phone]  Occupational Therapy Treatment  Patient Details  Name: Adam Cordova MRN: [id number] Date of Birth: 14-Jun-1956 Referring Provider: Gwendolyn Fill, PA  Encounter Date: 02/07/2017      OT End of Session - 02/07/17 1636    Visit Number 7   Number of Visits 17   Date for OT Re-Evaluation 02/24/17   Authorization Type [employer], no visit limit, no auth req; G-code needed   Authorization - Visit Number 7   Authorization - Number of Visits 10   OT Start Time 0935   OT Stop Time 1015   OT Time Calculation (min) 40 min      Past Medical History:  Diagnosis Date  . BPH (benign prostatic hypertrophy) with urinary retention   . Foley catheter in place   . Parkinson disease [facility])    neurologist--  dr Richardean Chimera  in [place]  . UTI (urinary tract infection)     Past Surgical History:  Procedure Laterality Date  . TRANSURETHRAL RESECTION OF PROSTATE N/A 11/23/2012   Procedure: TRANSURETHRAL RESECTION OF THE PROSTATE WITH GYRUS INSTRUMENTS;  Surgeon: Bernestine Amass, MD;  Location: [facility];  Service: Urology;  Laterality: N/A;    There were no vitals filed for this visit.      Subjective Assessment - 02/07/17 1634    Subjective  Pt reports he's feeling better( after stomach virus)   Pertinent History Parkinson's disease diagnosed in 2013 (tremors since 2011), BPH, hx of multiple falls (previous injuries include rib fx and punctured lung)   Limitations fall risk, cognitive deficits, occasional diplopia, decr superior visual tracking   Patient Stated Goals improve balance, improve writing, improve coordination, improve eating   Currently in Pain? No/denies                   Treatment:  Fastening buttons with max difficulty despite v.c and instruction. Pt appears to have visual perceptual  changes.          OT Education - 02/07/17 1635    Education provided Yes   Education Details reveiwed PWR! moves basic 4 in seated, memory compesations, keeping thinking skills sharp and ways to prevent future complications   Person(s) Educated Patient;Spouse   Methods Explanation;Demonstration;Verbal cues;Handout   Comprehension Verbalized understanding;Verbal cues required          OT Short Term Goals - 01/20/17 0946      OT SHORT TERM GOAL #1   Title Pt will be independent with PD-specific HEP.--check STGs 02/08/17 (extended due to being seen for decr frequency)   Time 4   Period Weeks   Status New     OT SHORT TERM GOAL #2   Title Pt will improve coordination for ADLs as shown by improving time on 9-hole peg test by at least 4sec with dominant RUE.   Baseline R-47.69sec, L-34.69sec   Time 4   Period Weeks   Status New     OT SHORT TERM GOAL #3   Title Pt will be able to don/doff jacket safely mod I (PPT#4 with jacket) in 90sec or less.   Baseline unable in standing due to difficulty with orientation (gown) and LOB x3 with min A to recover   Time 4   Period Weeks   Status New     OT SHORT TERM GOAL #4   Title Pt/wife will report incr  ability to perform bilateral coordinated ADL tasks as shown by fastening/unfastening 3 buttons in less than 33min utilizing strategies.   Time 4   Period Weeks   Status New     OT SHORT TERM GOAL #5   Title Pt will demonstrate improved bilateral UE function for ADLs as evidenced by increasing box / blocks by 3 blocks bilaterally.   Baseline RUE 49 blocks, LUE 42 blocks   Time 4   Period Weeks   Status New     OT SHORT TERM GOAL #6   Title Pt will write 3 sentences with at least 90% legibility and only min decr in size.   Time 4   Period Weeks   Status New           OT Long Term Goals - 01/20/17 0954      OT LONG TERM GOAL #1   Title Pt/caregiver will verbalize understanding of adaptive strategies/AE for ADLs (including  strategies for eating, writing, bed mobility, dressing, carrying objects, IADLs).--check LTGs 02/24/17   Time 8   Period Weeks   Status New     OT LONG TERM GOAL #2   Title Pt will improve coordination for ADLs as shown by improving time on 9-hole peg test by at least 8sec with dominant RUE.   Baseline R-47.69sec   Time 8   Period Weeks   Status New     OT LONG TERM GOAL #3   Title Pt will report incr ease with eating (getting food on utensil, less spills, and incr ease with cutting).   Time 8   Period Weeks   Status New     OT LONG TERM GOAL #4   Title Pt will be able to don/doff jacket safely mod I (PPT#4 with jacket) in 60sec or less.   Time 8   Period Weeks   Status New     OT LONG TERM GOAL #5   Title Pt will write 3 sentences with at least 100% legibility and only min decr in size.   Time 8   Period Weeks   Status New     OT LONG TERM GOAL #6   Title Pt will write 3 sentences with at least 100% legibility and only min decr in size.   Time 8   Period Weeks   Status New     OT LONG TERM GOAL #7   Title Pt will be able to reach at least 10" bilaterall for improved balance/decr fall risk for ADLs/IADLs.   Time 8   Period Weeks   Status New               Plan - 02/07/17 1638    Clinical Impression Statement Pt is progressing towards goals. Pt's wife attended today for additional education. Pt is going to be out of town next week for and MD appt. in [place]   Current Impairments/barriers affecting progress: cognitive deficits, severity of deficits, visual deficits   OT Frequency 2x / week   OT Duration 8 weeks   OT Treatment/Interventions Self-care/ADL training;Moist Heat;Fluidtherapy;DME and/or AE instruction;Splinting;Patient/family education;Balance training;Therapeutic exercises;Therapeutic activities;Therapeutic exercise;Ultrasound;Neuromuscular education;Functional Mobility Training;Passive range of motion;Cognitive  remediation/compensation;Visual/perceptual remediation/compensation;Manual Therapy;Energy conservation;[place];Cryotherapy   Plan dynamic step and reach, PD [place] Education provided:  issued coordination HEP; PWR! hands (basic 4), PWR! seated basic 4, adapted strateiges for cutting food and donning jacket   Consulted and Agree with Plan of Care Patient;Family  member/caregiver      Patient will benefit from skilled therapeutic intervention in order to improve the following deficits and impairments:  Decreased cognition, Decreased mobility, Impaired perceived functional ability, Impaired vision/preception, Impaired UE functional use, Decreased knowledge of use of DME, Decreased balance, Decreased activity tolerance, Impaired tone, Improper spinal/pelvic alignment, Decreased safety awareness, Decreased coordination, Decreased range of motion, Abnormal gait  Visit Diagnosis: Other symptoms and signs involving the nervous system  Other symptoms and signs involving the musculoskeletal system  Other lack of coordination  Visuospatial deficit  Attention and concentration deficit    Problem List Patient Active Problem List   Diagnosis Date Noted  . 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 Acute urinary retention 11/23/2012  . BPH (benign prostatic hyperplasia) 11/23/2012    Drey Shaff 02/07/2017, 4:40 PM Theone Murdoch, OTR/L [phone] Phone: [phone] 4:40 PM 02/07/17 [place] [address] [place], [place], [address] Phone: [phone]   Fax:  [phone]  Name: DIA JEFFERYS MRN: [id number] Date of Birth: 1956/05/14

## 2017-02-07 NOTE — Patient Instructions (Signed)
Memory Compensation Strategies  1. Use "WARM" strategy.  W= write it down  A= associate it  R= repeat it  M= make a mental note  2.   You can keep a Social worker.  Use a 3-ring notebook with sections for the following: calendar, important names and phone numbers,  medications, doctors' names/phone numbers, lists/reminders, and a section to journal what you did  each day.   3.    Use a calendar to write appointments down.  4.    Write yourself a schedule for the day.  This can be placed on the calendar or in a separate section of the Memory Notebook.  Keeping a  regular schedule can help memory.  5.    Use medication organizer with sections for each day or morning/evening pills.  You may need help loading it  6.    Keep a basket, or pegboard by the door.  Place items that you need to take out with you in the basket or on the pegboard.  You may also want to  include a message board for reminders.  7.    Use sticky notes.  Place sticky notes with reminders in a place where the task is performed.  For example: " turn off the  stove" placed by the stove, "lock the door" placed on the door at eye level, " take your medications" on  the bathroom mirror or by the place where you normally take your medications.  8.    Use alarms/timers.  Use while cooking to remind yourself to check on food or as a reminder to take your medicine, or as a  reminder to make a call, or as a reminder to perform another task, etc.     Keeping Thinking Skills Sharp: 1. Jigsaw puzzles 2. Card/board games 3. Talking on the phone/social events 4. Lumosity.com 5. Online games 6. Word serches/crossword puzzles 7.  Logic puzzles 8. Aerobic exercise (stationary bike) 9. Eating balanced diet (fruits & veggies) 10. Drink water 11. Try something new--new recipe, hobby 12. Crafts 13. Do a variety of activities that are challenging 14. Add cognitive activities to walking/exercising (think of animal/food/city  with each letter of the alphabet, counting backwards, thinking of as many vegetables as you can, etc.).--Only do this  If safe (no freezing/falls).       Ways to prevent future Parkinson's related complications: 1.   Exercise regularly,  2.   Focus on BIGGER movements during daily activities- really reach overhead, straighten elbows and extend fingers, spread feet apart and do not cross feet when stepping 3.   When dressing or reaching for your seatbelt make sure to use your body to assist by twisting while you reach-this can help to minimize stress on the shoulder and reduce the risk of a rotator cuff tear 4.   Swing your arms when you walk! People with PD are at increased risk for frozen shoulder and swinging your arms can reduce this risk.

## 2017-02-10 DIAGNOSIS — G20 Parkinson's disease: Secondary | ICD-10-CM | POA: Diagnosis not present

## 2017-02-12 ENCOUNTER — Ambulatory Visit: Payer: PPO | Admitting: Physical Therapy

## 2017-02-12 ENCOUNTER — Encounter: Payer: PPO | Admitting: Occupational Therapy

## 2017-02-13 ENCOUNTER — Ambulatory Visit: Payer: PPO | Admitting: Physical Therapy

## 2017-02-13 ENCOUNTER — Encounter: Payer: PPO | Admitting: Occupational Therapy

## 2017-02-19 ENCOUNTER — Ambulatory Visit: Payer: PPO | Admitting: Occupational Therapy

## 2017-02-19 ENCOUNTER — Ambulatory Visit: Payer: PPO | Admitting: Physical Therapy

## 2017-02-19 DIAGNOSIS — R2689 Other abnormalities of gait and mobility: Secondary | ICD-10-CM | POA: Diagnosis not present

## 2017-02-19 DIAGNOSIS — R41840 Attention and concentration deficit: Secondary | ICD-10-CM

## 2017-02-19 DIAGNOSIS — R29898 Other symptoms and signs involving the musculoskeletal system: Secondary | ICD-10-CM

## 2017-02-19 DIAGNOSIS — R29818 Other symptoms and signs involving the nervous system: Secondary | ICD-10-CM

## 2017-02-19 DIAGNOSIS — R278 Other lack of coordination: Secondary | ICD-10-CM

## 2017-02-19 DIAGNOSIS — R2681 Unsteadiness on feet: Secondary | ICD-10-CM

## 2017-02-19 NOTE — Therapy (Signed)
[facility] [address] [place], [place], [address] Phone: [phone]   Fax:  [phone]  Occupational Therapy Treatment  Patient Details  Name: Adam Cordova MRN: [id number] Date of Birth: Nov 27, 1955 Referring Provider: Gwendolyn Fill, PA  Encounter Date: 02/19/2017      OT End of Session - 02/19/17 1331    Visit Number 8   Number of Visits 17   Date for OT Re-Evaluation 02/24/17   Authorization Type [employer], no visit limit, no auth req; G-code needed   Authorization - Visit Number 8   Authorization - Number of Visits 10   OT Start Time 0930   OT Stop Time 1015   OT Time Calculation (min) 45 min   Activity Tolerance Patient tolerated treatment well      Past Medical History:  Diagnosis Date  . BPH (benign prostatic hypertrophy) with urinary retention   . Foley catheter in place   . Parkinson disease [facility])    neurologist--  dr Richardean Chimera  in [place]  . UTI (urinary tract infection)     Past Surgical History:  Procedure Laterality Date  . TRANSURETHRAL RESECTION OF PROSTATE N/A 11/23/2012   Procedure: TRANSURETHRAL RESECTION OF THE PROSTATE WITH GYRUS INSTRUMENTS;  Surgeon: Bernestine Amass, MD;  Location: [facility];  Service: Urology;  Laterality: N/A;    There were no vitals filed for this visit.      Subjective Assessment - 02/19/17 0941    Subjective  Per wife report: the doctor said it may be an atypical Parkinsons - they are thinking Lewibodies or MSA. We will see him again in January, then transfer to Dr. Carles Collet   Patient is accompained by: Family member  spouse   Pertinent History Parkinson's disease diagnosed in 2013 (tremors since 2011), BPH, hx of multiple falls (previous injuries include rib fx and punctured lung)   Limitations fall risk, cognitive deficits, occasional diplopia, decr superior visual tracking   Patient Stated Goals improve balance, improve writing,  improve coordination, improve eating   Currently in Pain? No/denies                      OT Treatments/Exercises (OP) - 02/19/17 0001      ADLs   UB Dressing Practiced donning/doffing button up shirt (like a jacket) with big movement strategies; pt required mod cueing. Practiced buttoning shirt with big movement strategies, pt required cues to stop and perform PWR! Hands but able to perform better after.  Therapist discussed alternative options for button up shirt including velcro sewn into shirt, keeping partially buttoned. Pt also practiced use of button hook but required mod to max cueing for sequencing and correct hand placement d/t cognitive deficits     Fine Motor Coordination   Other Fine Motor Exercises Reviewed coordination HEP - pt return demo with each hand with cues for big movement. Reviewed PWR! Hand flicks and encouraged to do often. Pt required cues for full wrist extension                  OT Short Term Goals - 02/19/17 1332      OT SHORT TERM GOAL #1   Title Pt will be independent with PD-specific HEP.--check STGs 02/08/17 (extended due to being seen for decr frequency)   Time 4   Period Weeks   Status On-going     OT SHORT TERM GOAL #2   Title Pt will improve coordination for  ADLs as shown by improving time on 9-hole peg test by at least 4sec with dominant RUE.   Baseline R-47.69sec, L-34.69sec   Time 4   Period Weeks   Status New     OT SHORT TERM GOAL #3   Title Pt will be able to don/doff jacket safely mod I (PPT#4 with jacket) in 90sec or less.   Baseline unable in standing due to difficulty with orientation (gown) and LOB x3 with min A to recover   Time 4   Period Weeks   Status New     OT SHORT TERM GOAL #4   Title Pt/wife will report incr ability to perform bilateral coordinated ADL tasks as shown by fastening/unfastening 3 buttons in less than 90min utilizing strategies.   Time 4   Period Weeks   Status New     OT SHORT TERM  GOAL #5   Title Pt will demonstrate improved bilateral UE function for ADLs as evidenced by increasing box / blocks by 3 blocks bilaterally.   Baseline RUE 49 blocks, LUE 42 blocks   Time 4   Period Weeks   Status New     OT SHORT TERM GOAL #6   Title Pt will write 3 sentences with at least 90% legibility and only min decr in size.   Time 4   Period Weeks   Status New           OT Long Term Goals - 01/20/17 0954      OT LONG TERM GOAL #1   Title Pt/caregiver will verbalize understanding of adaptive strategies/AE for ADLs (including strategies for eating, writing, bed mobility, dressing, carrying objects, IADLs).--check LTGs 02/24/17   Time 8   Period Weeks   Status New     OT LONG TERM GOAL #2   Title Pt will improve coordination for ADLs as shown by improving time on 9-hole peg test by at least 8sec with dominant RUE.   Baseline R-47.69sec   Time 8   Period Weeks   Status New     OT LONG TERM GOAL #3   Title Pt will report incr ease with eating (getting food on utensil, less spills, and incr ease with cutting).   Time 8   Period Weeks   Status New     OT LONG TERM GOAL #4   Title Pt will be able to don/doff jacket safely mod I (PPT#4 with jacket) in 60sec or less.   Time 8   Period Weeks   Status New     OT LONG TERM GOAL #5   Title Pt will write 3 sentences with at least 100% legibility and only min decr in size.   Time 8   Period Weeks   Status New     OT LONG TERM GOAL #6   Title Pt will write 3 sentences with at least 100% legibility and only min decr in size.   Time 8   Period Weeks   Status New     OT LONG TERM GOAL #7   Title Pt will be able to reach at least 10" bilaterall for improved balance/decr fall risk for ADLs/IADLs.   Time 8   Period Weeks   Status New               Plan - 02/19/17 1333    Clinical Impression Statement Pt returns today after almost 2 weeks. 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    Rehab Potential Fair   Current Impairments/barriers affecting progress: cognitive deficits, severity of deficits, visual deficits   OT Frequency 2x / week   OT Duration 6 weeks   OT Treatment/Interventions Self-care/ADL training;Moist Heat;Fluidtherapy;DME and/or AE instruction;Splinting;Patient/family education;Balance training;Therapeutic exercises;Therapeutic activities;Therapeutic exercise;Ultrasound;Neuromuscular education;Functional Mobility Training;Passive range of motion;Cognitive remediation/compensation;Visual/perceptual remediation/compensation;Manual Therapy;Energy conservation;Parrafin;Cryotherapy   Plan Briefly review PWR! Seated, PWR! Supine, begin checking STG's. (Following session: finish assessing STG's, dynamic step and reach, PD 201 info, renewal all to be done by primary therapist)      Patient will benefit from skilled therapeutic intervention in order to improve the following deficits and impairments:  Decreased cognition, Decreased mobility, Impaired perceived functional ability, Impaired vision/preception, Impaired UE functional use, Decreased knowledge of use of DME, Decreased balance, Decreased activity tolerance, Impaired tone, Improper spinal/pelvic alignment, Decreased safety awareness, Decreased coordination, Decreased range of motion, Abnormal gait  Visit Diagnosis: Other symptoms and signs involving the nervous system  Other symptoms and signs involving the musculoskeletal system  Other lack of coordination  Attention and concentration deficit    Problem List Patient Active Problem List   Diagnosis Date Noted  . 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 Acute urinary retention 11/23/2012  . BPH (benign prostatic hyperplasia) 11/23/2012    Carey Bullocks, OTR/L 02/19/2017, 1:38 PM  [facility] [address] [place], [place], [address] Phone: [phone]   Fax:   [phone]  Name: Adam Cordova MRN: [id number] Date of Birth: Jan 10, 1956

## 2017-02-19 NOTE — Therapy (Signed)
[facility] [address] [place], [place], [address] Phone: [phone]   Fax:  [phone]  Physical Therapy Treatment  Patient Details  Name: Adam Cordova MRN: [id number] Date of Birth: 1955/09/24 Referring Provider: [name], PA  Encounter Date: 02/19/2017      PT End of Session - 02/19/17 2250    Visit Number 6   Number of Visits 17   Date for PT Re-Evaluation 02/24/17   Authorization Type [employer] -GCODE every 10th visit   PT Start Time 1020   PT Stop Time 1100   PT Time Calculation (min) 40 min   Equipment Utilized During Treatment Gait belt   Activity Tolerance Patient tolerated treatment well   Behavior During Therapy [facility] for tasks assessed/performed      Past Medical History:  Diagnosis Date  . BPH (benign prostatic hypertrophy) with urinary retention   . Foley catheter in place   . Parkinson disease [facility])    neurologist--  dr Richardean Chimera  in [place]  . UTI (urinary tract infection)     Past Surgical History:  Procedure Laterality Date  . TRANSURETHRAL RESECTION OF PROSTATE N/A 11/23/2012   Procedure: TRANSURETHRAL RESECTION OF THE PROSTATE WITH GYRUS INSTRUMENTS;  Surgeon: Bernestine Amass, MD;  Location: [facility];  Service: Urology;  Laterality: N/A;    There were no vitals filed for this visit.      Subjective Assessment - 02/19/17 1022    Subjective Was in [place] to see neurologist last week-likely not typical Parkinson's disease.  Had a couple of near-falls, but did not hit the ground.  Feeling like freezing more since I got back from the trip.l   Patient is accompained by: Family member  wife, [name]   Pertinent History Parkinson's disease; Update from neurologist visit in [place] 02/19/17-possible [place] Body disease or [place]   Patient Stated Goals Pt's goals for therapy are to "dramatically improve my sense of balance."   Currently in Pain? No/denies                          [facility] Adult PT Treatment/Exercise - 02/19/17 1029      Transfers   Transfers Sit to Stand;Stand to Sit   Sit to Stand 5: Supervision;Without upper extremity assist;From bed   Stand to Sit 5: Supervision;Without upper extremity assist;To bed   Number of Reps 10 reps     Self-Care   Self-Care Other Self-Care Comments   Other Self-Care Comments  Brief discussion regarding MD visit.  Offered to discuss/provide education on possible diagnoses, particularly in regards to falls, safety education.  Pt denies needing any additional information at this time.  Reviewed tips to reduce freezing episodes, as pt requests.           P[facility]) - 02/19/17 1047    PWR! Up x 10 reps standing at counter-modified by continuing to hold to counter with upright posture   PWR! Rock x 10 reps  3 sets, with and without UE reaching   PWR! Twist x 10 reps, modified with UE support at counter   PWR Step x 10 reps side, x 10 reps forward, x 10 reps back with UE support bilateral          Balance Exercises - 02/19/17 2247      Balance Exercises: Standing   Stepping Strategy Anterior;Posterior;Lateral;UE support;10 reps  At counter   Turning Right;Left;5 reps  Quarter turns chair<>counter<>chair  with UE support and cues   Marching Limitations Marching in place x 10 reps, cues for slowed pace and increased SLS time   Other Standing Exercises With turning practice, cues provided for widened BOS and slowed pace to improve safety with turns and change of directions.             PT Short Term Goals - 02/07/17 1020      PT SHORT TERM GOAL #1   Title Pt will perform HEP for balance, strength/stretching, gait with wife's supervision.  TARGET 01/24/17   Time 4   Period Weeks   Status On-going     PT SHORT TERM GOAL #2   Title Pt will perform at least 8 of 10 reps of sit<>stand transfers, from 18" surfaces or lower, with no UE support, no posterior LOB, for improved  efficiency and safety with transfers.   Baseline with supervision and cues 02/07/17   Time 4   Period Weeks   Status Partially Met     PT SHORT TERM GOAL #3   Title Pt will improve MiniBESTest score to at least 18/28 for decreased fall risk.   Time 4   Period Weeks   Status Achieved     PT SHORT TERM GOAL #4   Title Pt will verbalize understanding of tips to reduce freezing and festination of gait and turns.   Baseline Wife present for this education x 2 as well.   Time 4   Period Weeks   Status Achieved           PT Long Term Goals - 02/07/17 2303      PT LONG TERM GOAL #1   Title Pt will verbalize understanding of fall prevention in home environment.  TARGET 02/24/17   Time 8   Period Weeks   Status On-going     PT LONG TERM GOAL #2   Title Pt will report at least 50% improvement in car transfers.   Time 8   Period Weeks   Status On-going     PT LONG TERM GOAL #3   Title Pt will improve MiniBESTest score to at least 22/28 for decreased fall risk.   Time 8   Period Weeks   Status On-going     PT LONG TERM GOAL #4   Title Pt will improve TUG and TUG cognitive score to within <10% difference, for improved dual tasking with gait.   Time 8   Period Weeks   Status On-going     PT LONG TERM GOAL #5   Title Pt will perform floor>stand transfer, modified independently, for improved safety with fall recovery.   Time 8   Period Weeks   Status On-going               Plan - 02/19/17 2252    Clinical Impression Statement Skilled PT session focused today on weightshifting and turns to assist with freezing episodes.  Pt overall appears to have slowed pace of movement patterns, but continues to be unsteady with transition movements.  Per pt report, neurologist in [place] has changed medications and thinks atypical Parkinson's disease.  Attempted discussion of additional fall prevention and safety awareness, possibility of assistive device, but pt declines further  discussion.   Rehab Potential Good   PT Frequency 2x / week   PT Duration 8 weeks  plus evaluation   PT Treatment/Interventions ADLs/Self Care Home Management;Functional mobility training;Gait training;Therapeutic activities;Therapeutic exercise;Balance training;Neuromuscular re-education;Patient/family education   PT Next Visit Plan Work  to reviewPWR! Quadruped, add to HEP?-PWR! Standing, stepping strategies; gait with turns/starts and stops, forward/back gait; discuss fall prevention   Consulted and Agree with Plan of Care Patient      Patient will benefit from skilled therapeutic intervention in order to improve the following deficits and impairments:  Abnormal gait, Decreased balance, Decreased safety awareness, Decreased strength, Difficulty walking, Impaired flexibility, Postural dysfunction  Visit Diagnosis: Other abnormalities of gait and mobility  Unsteadiness on feet  Other symptoms and signs involving the nervous system     Problem List Patient Active Problem List   Diagnosis Date Noted  . 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 Acute urinary retention 11/23/2012  . BPH (benign prostatic hyperplasia) 11/23/2012    Rayya Yagi W. 02/19/2017, 10:58 PM Frazier Butt., PT  [facility] [address] [place], [place], [address] Phone: [phone]   Fax:  [phone]  Name: DEONE LEIFHEIT MRN: [id number] Date of Birth: 1956/01/11

## 2017-02-20 ENCOUNTER — Ambulatory Visit: Payer: PPO | Admitting: Occupational Therapy

## 2017-02-20 ENCOUNTER — Encounter: Payer: Self-pay | Admitting: Physical Therapy

## 2017-02-20 ENCOUNTER — Ambulatory Visit: Payer: PPO | Admitting: Physical Therapy

## 2017-02-20 DIAGNOSIS — R29898 Other symptoms and signs involving the musculoskeletal system: Secondary | ICD-10-CM

## 2017-02-20 DIAGNOSIS — R2689 Other abnormalities of gait and mobility: Secondary | ICD-10-CM

## 2017-02-20 DIAGNOSIS — R29818 Other symptoms and signs involving the nervous system: Secondary | ICD-10-CM

## 2017-02-20 DIAGNOSIS — R41842 Visuospatial deficit: Secondary | ICD-10-CM

## 2017-02-20 DIAGNOSIS — R278 Other lack of coordination: Secondary | ICD-10-CM

## 2017-02-20 DIAGNOSIS — R41840 Attention and concentration deficit: Secondary | ICD-10-CM

## 2017-02-20 DIAGNOSIS — R2681 Unsteadiness on feet: Secondary | ICD-10-CM

## 2017-02-20 NOTE — Therapy (Signed)
[facility] [address] [place], [place], [address] Phone: [phone]   Fax:  [phone]  Occupational Therapy Treatment  Patient Details  Name: Adam Cordova MRN: [id number] Date of Birth: 1956/07/19 Referring Provider: Gwendolyn Fill, PA  Encounter Date: 02/20/2017      OT End of Session - 02/20/17 1452    Visit Number 9   Number of Visits 17   Date for OT Re-Evaluation 02/24/17   Authorization Type [employer], no visit limit, no auth req; G-code needed   Authorization - Visit Number 9   Authorization - Number of Visits 10   OT Start Time 0800   OT Stop Time 0845   OT Time Calculation (min) 45 min   Activity Tolerance Patient tolerated treatment well      Past Medical History:  Diagnosis Date  . BPH (benign prostatic hypertrophy) with urinary retention   . Foley catheter in place   . Parkinson disease [facility])    neurologist--  dr Richardean Chimera  in [place]  . UTI (urinary tract infection)     Past Surgical History:  Procedure Laterality Date  . TRANSURETHRAL RESECTION OF PROSTATE N/A 11/23/2012   Procedure: TRANSURETHRAL RESECTION OF THE PROSTATE WITH GYRUS INSTRUMENTS;  Surgeon: Bernestine Amass, MD;  Location: [facility];  Service: Urology;  Laterality: N/A;    There were no vitals filed for this visit.      Subjective Assessment - 02/20/17 0838    Subjective  Per wife report on 02/19/17: the doctor said it may be an atypical Parkinsons - they are thinking Lewibodies or MSA. We will see him again in January, then transfer to Dr. Carles Collet   Pertinent History Parkinson's disease diagnosed in 2013 (tremors since 2011), BPH, hx of multiple falls (previous injuries include rib fx and punctured lung)   Limitations fall risk, cognitive deficits, occasional diplopia, decr superior visual tracking   Patient Stated Goals improve balance, improve writing, improve coordination, improve eating    Currently in Pain? No/denies                      OT Treatments/Exercises (OP) - 02/20/17 0001      ADLs   UB Dressing Practiced hooking/unhooking buttons prior to assessing STG #4 and performed PWR! Hand flicks, however pt still very slow d/t motor and cognitive deficits. Recommend pull over and/or magnetic or velcro option for shirts   ADL Comments Began assessing STG's and progress to date (see goal section)     Neurological Re-education Exercises   Other Exercises 1 Reviewed PWR! Seated basic 4 - pt required mod to max cueing to perform all correctly for large amplitude movements, proper technique and sequencing   Other Exercises 2 Performed PWR! Supine basic 4 with modifications to PWR! Step (due to cognitive deficits) - pt required max cueing and therefore did not issue as HEP.                   OT Short Term Goals - 02/20/17 1453      OT SHORT TERM GOAL #1   Title Pt/family will be independent with PD-specific HEP.--check STGs 02/08/17 (extended due to being seen for decr frequency)   Time 4   Period Weeks   Status Revised     OT SHORT TERM GOAL #2   Title Pt will improve coordination for ADLs as shown by improving time on 9-hole peg test by at least 4sec  with dominant RUE.   Baseline R-47.69sec, L-34.69sec   Time 4   Period Weeks   Status Achieved  02/20/17: 33.72 sec.      OT SHORT TERM GOAL #3   Title Pt will be able to don/doff jacket safely mod I (PPT#4 with jacket) in 90sec or less.   Baseline unable in standing due to difficulty with orientation (gown) and LOB x3 with min A to recover   Time 4   Period Weeks   Status On-going     OT SHORT TERM GOAL #4   Title Pt/wife will report incr ability to perform bilateral coordinated ADL tasks as shown by fastening/unfastening 3 buttons in less than 4mn utilizing strategies.   Time 4   Period Weeks   Status Not Met  02/20/17:  3 min. 30 sec. after practice and cueing     OT SHORT TERM GOAL #5    Title Pt will demonstrate improved bilateral UE function for ADLs as evidenced by increasing box / blocks by 3 blocks bilaterally.   Baseline RUE 49 blocks, LUE 42 blocks   Time 4   Period Weeks   Status Not Met  02/20/17:  Rt = 37 blocks, Lt = 44 blocks     OT SHORT TERM GOAL #6   Title Pt will write 3 sentences with at least 90% legibility and only min decr in size.   Time 4   Period Weeks   Status On-going           OT Long Term Goals - 01/20/17 0954      OT LONG TERM GOAL #1   Title Pt/caregiver will verbalize understanding of adaptive strategies/AE for ADLs (including strategies for eating, writing, bed mobility, dressing, carrying objects, IADLs).--check LTGs 02/24/17   Time 8   Period Weeks   Status New     OT LONG TERM GOAL #2   Title Pt will improve coordination for ADLs as shown by improving time on 9-hole peg test by at least 8sec with dominant RUE.   Baseline R-47.69sec   Time 8   Period Weeks   Status New     OT LONG TERM GOAL #3   Title Pt will report incr ease with eating (getting food on utensil, less spills, and incr ease with cutting).   Time 8   Period Weeks   Status New     OT LONG TERM GOAL #4   Title Pt will be able to don/doff jacket safely mod I (PPT#4 with jacket) in 60sec or less.   Time 8   Period Weeks   Status New     OT LONG TERM GOAL #5   Title Pt will write 3 sentences with at least 100% legibility and only min decr in size.   Time 8   Period Weeks   Status New     OT LONG TERM GOAL #6   Title Pt will write 3 sentences with at least 100% legibility and only min decr in size.   Time 8   Period Weeks   Status New     OT LONG TERM GOAL #7   Title Pt will be able to reach at least 10" bilaterall for improved balance/decr fall risk for ADLs/IADLs.   Time 8   Period Weeks   Status New               Plan - 02/20/17 1455    Clinical Impression Statement Pt has had no improvements in ability  to button/unbutton shirt. Pt  improved in 9 hole peg test however declined in Box & Blocks RUE. Pt's progress remains limited d/t severity of deficits, cognitive impairments, and inconsistent attendance (d/t being out of town).    Rehab Potential Fair   Current Impairments/barriers affecting progress: cognitive deficits, severity of deficits, visual deficits   OT Frequency 2x / week   OT Duration 6 weeks   OT Treatment/Interventions Self-care/ADL training;Moist Heat;Fluidtherapy;DME and/or AE instruction;Splinting;Patient/family education;Balance training;Therapeutic exercises;Therapeutic activities;Therapeutic exercise;Ultrasound;Neuromuscular education;Functional Mobility Training;Passive range of motion;Cognitive remediation/compensation;Visual/perceptual remediation/compensation;Manual Therapy;Energy conservation;Parrafin;Cryotherapy   Plan G-code, finish assessing STG's, give PD 201 info (per primary therapist's last plan), ? renewal vs. d/c (end date 02/24/17)       Patient will benefit from skilled therapeutic intervention in order to improve the following deficits and impairments:  Decreased cognition, Decreased mobility, Impaired perceived functional ability, Impaired vision/preception, Impaired UE functional use, Decreased knowledge of use of DME, Decreased balance, Decreased activity tolerance, Impaired tone, Improper spinal/pelvic alignment, Decreased safety awareness, Decreased coordination, Decreased range of motion, Abnormal gait  Visit Diagnosis: Other symptoms and signs involving the nervous system  Other symptoms and signs involving the musculoskeletal system  Other lack of coordination  Attention and concentration deficit  Visuospatial deficit  Other abnormalities of gait and mobility  Unsteadiness on feet    Problem List Patient Active Problem List   Diagnosis Date Noted  . 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 Acute urinary retention 11/23/2012  . BPH (benign prostatic hyperplasia) 11/23/2012     Carey Bullocks, OTR/L 02/20/2017, 3:04 PM  [facility] [address] [place], [place], [address] Phone: [phone]   Fax:  [phone]  Name: AQIB LOUGH MRN: [id number] Date of Birth: 09/01/1956

## 2017-02-20 NOTE — Therapy (Signed)
[facility] [address] [place], [place], [address] Phone: [phone]   Fax:  [phone]  Physical Therapy Treatment  Patient Details  Name: Adam Cordova MRN: [id number] Date of Birth: 10/04/1955 Referring Provider: [name], PA  Encounter Date: 02/20/2017      PT End of Session - 02/20/17 1951    Visit Number 7   Number of Visits 17   Date for PT Re-Evaluation 02/24/17   Authorization Type [employer] -GCODE every 10th visit   PT Start Time 0848   PT Stop Time 0933   PT Time Calculation (min) 45 min   Equipment Utilized During Treatment Gait belt   Activity Tolerance Patient tolerated treatment well   Behavior During Therapy [facility] for tasks assessed/performed      Past Medical History:  Diagnosis Date  . BPH (benign prostatic hypertrophy) with urinary retention   . Foley catheter in place   . Parkinson disease [facility])    neurologist--  dr Richardean Chimera  in [place]  . UTI (urinary tract infection)     Past Surgical History:  Procedure Laterality Date  . TRANSURETHRAL RESECTION OF PROSTATE N/A 11/23/2012   Procedure: TRANSURETHRAL RESECTION OF THE PROSTATE WITH GYRUS INSTRUMENTS;  Surgeon: Bernestine Amass, MD;  Location: [facility];  Service: Urology;  Laterality: N/A;    There were no vitals filed for this visit.      Subjective Assessment - 02/20/17 0849    Subjective Had a fall last night on the patio at home; got tripped up on the uneven surface of the patio.  Able to get up on my own.   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 No/denies                         [facility] Adult PT Treatment/Exercise  - 02/20/17 0001      Transfers   Transfers Sit to Stand;Stand to Sit   Sit to Stand 5: Supervision;Without upper extremity assist;From bed;From chair/3-in-1   Stand to Sit 5: Supervision;Without upper extremity assist;To bed;To chair/3-in-1   Number of Reps --  At least 5 reps throughout session   Transfer Cueing Discussed technique for low surface and car transfers.  Pt reports noticeing at least 50% improvement in car transfers, as he is more aware of need for forward lean.   Comments Floor>stand transfers performed, x 4 reps, with cues provided for widened BOS and upright posture upon standing to prevent uncontrolled posterior lean and LOB.  Pt requires min guard assistance upon standing to recover balance.  Recommended use of UE support at sturdy chair, but pt did not use this during practices today.     Self-Care   Self-Care Other Self-Care Comments           PWR [facility]) - 02/20/17 0914    PWR! exercises Moves in [place];Moves in standing   PWR! Up Quadruped x 10   PWR! Rock Quadruped x 10 each side   PWR! Twist Quadruped x 10 each side   PWR! Step Quadruped x 10 each side   Comments Mod cues for technique   PWR! Up Standing x 10 reps standing at  counter-modified by continuing to hold to counter with upright posture   PWR! Rock Standing x 10 reps  2 sets with UE reaching   PWR! Twist Standing x 10 reps, modified with UE support   PWR Step Standing x 10 reps each side; then x 10 reps back with step back and rock, needing max verbal cues for technique          Balance Exercises - 02/20/17 1941      Balance Exercises: Standing   Turning Right;Left;5 reps  Reviewed quarter turns from yesterday's session-see below   Other Standing Exercises Stagger stance forward/back rocking, with pt unable to correctly sequence, with verbal and tactile cues.  With quarter turns, pt needs max cues for hand placement for UE support, and when activity is completed, pt crosses over feet with  LOB to turn to change direction for next activity in therapy session.           PT Education - 02/20/17 1948    Education provided Yes   Education Details Briefly discussed with patient and wife trying U-step RW for safety with gait; pt declines and does not want to use [place].  Discussed beginning of LTG check and POC/possible d/c; discussed again safety concerns with gait    Person(s) Educated Patient;Spouse   Methods Explanation   Comprehension Verbalized understanding          PT Short Term Goals - 02/07/17 1020      PT SHORT TERM GOAL #1   Title Pt will perform HEP for balance, strength/stretching, gait with wife's supervision.  TARGET 01/24/17   Time 4   Period Weeks   Status On-going     PT SHORT TERM GOAL #2   Title Pt will perform at least 8 of 10 reps of sit<>stand transfers, from 18" surfaces or lower, with no UE support, no posterior LOB, for improved efficiency and safety with transfers.   Baseline with supervision and cues 02/07/17   Time 4   Period Weeks   Status Partially Met     PT SHORT TERM GOAL #3   Title Pt will improve MiniBESTest score to at least 18/28 for decreased fall risk.   Time 4   Period Weeks   Status Achieved     PT SHORT TERM GOAL #4   Title Pt will verbalize understanding of tips to reduce freezing and festination of gait and turns.   Baseline Wife present for this education x 2 as well.   Time 4   Period Weeks   Status Achieved           PT Long Term Goals - 02/20/17 0856      PT LONG TERM GOAL #1   Title Pt will verbalize understanding of fall prevention in home environment.  TARGET 02/24/17   Time 8   Period Weeks   Status On-going     PT LONG TERM GOAL #2   Title Pt will report at least 50% improvement in car transfers.   Baseline Per pt report   Time 8   Period Weeks   Status Achieved     PT LONG TERM GOAL #3   Title Pt will improve MiniBESTest score to at least 22/28 for decreased fall risk.   Time 8   Period  Weeks   Status On-going     PT LONG TERM GOAL #4   Title Pt will improve TUG and TUG cognitive score to within <10% difference, for improved dual tasking with  gait.   Time 8   Period Weeks   Status On-going     PT LONG TERM GOAL #5   Title Pt will perform floor>stand transfer, modified independently, for improved safety with fall recovery.   Baseline Needs min guard assistance   Time 8   Period Weeks   Status Not Met               Plan - 02/20/17 1951    Clinical Impression Statement Began assessing LTGs-pt has met LTG 2 for car transfers, but LTG 5 not met, with pt needing min guard assist for safety for floor>stand transfers.  Pt continues to have uncontrolled LOB with transitional movements (turns, transfers, change of direction), despite cues, repeated practice.   Rehab Potential Good   PT Frequency 2x / week   PT Duration 8 weeks  plus evaluation   PT Treatment/Interventions ADLs/Self Care Home Management;Functional mobility training;Gait training;Therapeutic activities;Therapeutic exercise;Balance training;Neuromuscular re-education;Patient/family education   PT Next Visit Plan Discuss with patient and family possible d/c next visit (will need to be recert and d/c); discuss SAFETY with gait, again, ?trial of U-step RW   Consulted and Agree with Plan of Care Patient      Patient will benefit from skilled therapeutic intervention in order to improve the following deficits and impairments:  Abnormal gait, Decreased balance, Decreased safety awareness, Decreased strength, Difficulty walking, Impaired flexibility, Postural dysfunction  Visit Diagnosis: Unsteadiness on feet  Other abnormalities of gait and mobility     Problem List Patient Active Problem List   Diagnosis Date Noted  . 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 Acute urinary retention 11/23/2012  . BPH (benign prostatic hyperplasia) 11/23/2012    Jashay Roddy W. 02/20/2017, 7:54 PM  Frazier Butt.,  PT   [facility] [address] [place], [place], [address] Phone: [phone]   Fax:  [phone]  Name: Adam Cordova MRN: [id number] Date of Birth: Dec 06, 1955

## 2017-02-26 ENCOUNTER — Ambulatory Visit: Payer: PPO | Admitting: Occupational Therapy

## 2017-02-26 ENCOUNTER — Ambulatory Visit: Payer: PPO | Admitting: Physical Therapy

## 2017-02-26 DIAGNOSIS — R293 Abnormal posture: Secondary | ICD-10-CM

## 2017-02-26 DIAGNOSIS — R2689 Other abnormalities of gait and mobility: Secondary | ICD-10-CM

## 2017-02-26 DIAGNOSIS — R41840 Attention and concentration deficit: Secondary | ICD-10-CM

## 2017-02-26 DIAGNOSIS — R41844 Frontal lobe and executive function deficit: Secondary | ICD-10-CM

## 2017-02-26 DIAGNOSIS — R29818 Other symptoms and signs involving the nervous system: Secondary | ICD-10-CM

## 2017-02-26 DIAGNOSIS — R278 Other lack of coordination: Secondary | ICD-10-CM

## 2017-02-26 DIAGNOSIS — R2681 Unsteadiness on feet: Secondary | ICD-10-CM

## 2017-02-26 DIAGNOSIS — R41842 Visuospatial deficit: Secondary | ICD-10-CM

## 2017-02-26 DIAGNOSIS — R29898 Other symptoms and signs involving the musculoskeletal system: Secondary | ICD-10-CM

## 2017-02-27 DIAGNOSIS — R2689 Other abnormalities of gait and mobility: Secondary | ICD-10-CM | POA: Diagnosis not present

## 2017-02-27 NOTE — Therapy (Signed)
[facility] [address] [place], [place], [address] Phone: [phone]   Fax:  [phone]  Physical Therapy Treatment  Patient Details  Name: Adam Cordova MRN: [id number] Date of Birth: 27-Jan-1956 Referring Provider: [name], PA  Encounter Date: 02/26/2017      PT End of Session - 02/26/17 1959    Visit Number 8   Number of Visits 17   Date for PT Re-Evaluation 04/27/17   Authorization Type [employer] -GCODE every 10th visit   PT Start Time 0931   PT Stop Time 1017   PT Time Calculation (min) 46 min   Equipment Utilized During Treatment Gait belt   Activity Tolerance Patient tolerated treatment well   Behavior During Therapy [facility] for tasks assessed/performed      Past Medical History:  Diagnosis Date  . BPH (benign prostatic hypertrophy) with urinary retention   . Foley catheter in place   . Parkinson disease [facility])    neurologist--  dr Richardean Chimera  in [place]  . UTI (urinary tract infection)     Past Surgical History:  Procedure Laterality Date  . TRANSURETHRAL RESECTION OF PROSTATE N/A 11/23/2012   Procedure: TRANSURETHRAL RESECTION OF THE PROSTATE WITH GYRUS INSTRUMENTS;  Surgeon: Bernestine Amass, MD;  Location: [facility];  Service: Urology;  Laterality: N/A;    There were no vitals filed for this visit.      Subjective Assessment - 02/26/17 0934    Subjective Had two falls since last visit.  One fall was sliding in [employer] and fell into table.  Second fall was bending down to pick up object from floor and fell  backwards when coming up. Pt's wife reports mind is clearer and they feel like he is moving better since stopping the Pramipexol/Mirapex   Patient is accompained by: --  spoke with wife, [name], at end of session   Pertinent History Parkinson's disease; Update from neurologist visit in [place] 02/19/17-possible [place] Body disease or [place]   Patient Stated Goals Pt's goals  for therapy are to "dramatically improve my sense of balance."   Currently in Pain? No/denies                         [facility] Adult PT Treatment/Exercise - 02/26/17 0941      Transfers   Transfers Sit to Stand;Stand to Sit   Sit to Stand 5: Supervision;Without upper extremity assist;From bed;From chair/3-in-1   Five time sit to stand comments  10.59   Stand to Sit 5: Supervision;Without upper extremity assist;To bed;To chair/3-in-1     Timed Up and Go Test   TUG Normal TUG;Cognitive TUG   Normal TUG (seconds) 9.25   Cognitive TUG (seconds) 8.59     High Level Balance   High Level Balance Comments MiniBESTest score 18/28 (decreased from 20/28)     Self-Care   Self-Care Other Self-Care Comments   Other Self-Care Comments  Discussed fall prevention-especially with compliant surface, decreased ability for balance strategies.  Discussed POC/decreased progress with goals due to pt's inconsistent attendance with being out of town.  Discussed (and PT demonstrated U-step RW including benefits to using U-step, potential for increased safety with gait.  Pt does not want to use at this time, does not want to trial during therapy session.     Mini-BESTest: Balance Evaluation Systems Test  2005-2013 [employer]. All rights reserved. ________________________________________________________________________________________Anticipatory_________Subscore___4__/6 1. SIT TO STAND Instruction: "Cross your  arms across your chest. Try not to use your hands unless you must.Do not let your legs lean against the back of the chair when you stand. Please stand up now." X(2) Normal: Comes to stand without use of hands and stabilizes independently. (1) Moderate: Comes to stand WITH use of hands on first attempt. (0) Severe: Unable to stand up from chair without assistance, OR needs several attempts with use of hands. 2. RISE TO TOES Instruction: "Place your feet shoulder  width apart. Place your hands on your hips. Try to rise as high as you can onto your toes. I will count out loud to 3 seconds. Try to hold this pose for at least 3 seconds. Look straight ahead. Rise now." (2) Normal: Stable for 3 s with maximum height. X(1) Moderate: Heels up, but not full range (smaller than when holding hands), OR noticeable instability for 3 s. (0) Severe: < 3 s. 3. STAND ON ONE LEG Instruction: "Look straight ahead. Keep your hands on your hips. Lift your leg off of the ground behind you without touching or resting your raised leg upon your other standing leg. Stay standing on one leg as long as you can. Look straight ahead. Lift now." Left: Time in Seconds Trial 1:_1.06____Trial 2:__1.59___ (2) Normal: 20 s. X(1) Moderate: < 20 s. (0) Severe: Unable. Right: Time in Seconds Trial 1:__3.82___Trial 2:_6.94___ (2) Normal: 20 s. X(1) Moderate: < 20 s. (0) Severe: Unable To score each side separately use the trial with the longest time. To calculate the sub-score and total score use the side [left or right] with the lowest numerical score [i.e. the worse side]. ______________________________________________________________________________________Reactive Postural Control___________Subscore:__2___/6 4. COMPENSATORY STEPPING CORRECTION- FORWARD Instruction: "Stand with your feet shoulder width apart, arms at your sides. Lean forward against my hands beyond your forward limits. When I let go, do whatever is necessary, including taking a step, to avoid a fall." (2) Normal: Recovers independently with a single, large step (second realignment step is allowed). (1) Moderate: More than one step used to recover equilibrium. X(0) Severe: No step, OR would fall if not caught, OR falls spontaneously. 5. COMPENSATORY STEPPING CORRECTION- BACKWARD Instruction: "Stand with your feet shoulder width apart, arms at your sides. Lean backward against my hands beyond your backward limits. When  I let go, do whatever is necessary, including taking a step, to avoid a fall." (2) Normal: Recovers independently with a single, large step. (1) Moderate: More than one step used to recover equilibrium. X(0) Severe: No step, OR would fall if not caught, OR falls spontaneously. 6. COMPENSATORY STEPPING CORRECTION- LATERAL Instruction: "Stand with your feet together, arms down at your sides. Lean into my hand beyond your sideways limit. When I let go, do whatever is necessary, including taking a step, to avoid a fall." Left X(2) Normal: Recovers independently with 1 step (crossover or lateral OK). (1) Moderate: Several steps to recover equilibrium. (0) Severe: Falls, or cannot step. Right X(2) Normal: Recovers independently with 1 step (crossover or lateral OK). (1) Moderate: Several steps to recover equilibrium. (0) Severe: Falls, or cannot step. Use the side with the lowest score to calculate sub-score and total score. ____________________________________________________________________________________Sensory Orientation_____________Subscore:______3___/6 7. STANCE (FEET TOGETHER); EYES OPEN, FIRM SURFACE Instruction: "Place your hands on your hips. Place your feet together until almost touching. Look straight ahead. Be as stable and still as possible, until I say stop." Time in seconds:________ X(2) Normal: 30 s. (1) Moderate: < 30 s. (0) Severe: Unable. 8. STANCE (FEET TOGETHER); EYES CLOSED,  FOAM SURFACE Instruction: "Step onto the foam. Place your hands on your hips. Place your feet together until almost touching. Be as stable and still as possible, until I say stop. I will start timing when you close your eyes." Time in seconds:________ (2) Normal: 30 s. (1) Moderate: < 30 s. X(0) Severe: Unable. 9. INCLINE- EYES CLOSED Instruction: "Step onto the incline ramp. Please stand on the incline ramp with your toes toward the top. Place your feet shoulder width apart and have your  arms down at your sides. I will start timing when you close your eyes." Time in seconds:________ (2) Normal: Stands independently 30 s and aligns with gravity. X(1) Moderate: Stands independently <30 s OR aligns with surface. (0) Severe: Unable. _________________________________________________________________________________________Dynamic Gait ______Subscore______9_/10 10. CHANGE IN GAIT SPEED Instruction: "Begin walking at your normal speed, when I tell you 'fast', walk as fast as you can. When I say 'slow', walk very slowly." X(2) Normal: Significantly changes walking speed without imbalance. (1) Moderate: Unable to change walking speed or signs of imbalance. (0) Severe: Unable to achieve significant change in walking speed AND signs of imbalance. [place] - HORIZONTAL Instruction: "Begin walking at your normal speed, when I say "right", turn your head and look to the right. When I say "left" turn your head and look to the left. Try to keep yourself walking in a straight line." X(2) Normal: performs head turns with no change in gait speed and good balance. (1) Moderate: performs head turns with reduction in gait speed. (0) Severe: performs head turns with imbalance. 12. WALK WITH PIVOT TURNS Instruction: "Begin walking at your normal speed. When I tell you to 'turn and stop', turn as quickly as you can, face the opposite direction, and stop. After the turn, your feet should be close together." X(2) Normal: Turns with feet close FAST (< 3 steps) with good balance. (1) Moderate: Turns with feet close SLOW (>4 steps) with good balance. (0) Severe: Cannot turn with feet close at any speed without imbalance. 13. STEP OVER OBSTACLES Instruction: "Begin walking at your normal speed. When you get to the box, step over it, not around it and keep walking." (2) Normal: Able to step over box with minimal change of gait speed and with good balance. X(1) Moderate: Steps over box but  touches box OR displays cautious behavior by slowing gait. (0) Severe: Unable to step over box OR steps around box. 14. TIMED UP & GO WITH DUAL TASK [3 METER WALK] Instruction TUG: "When I say 'Go', stand up from chair, walk at your normal speed across the tape on the floor, turn around, and come back to sit in the chair." Instruction TUG with Dual Task: "Count backwards by threes starting at ___. When I say 'Go', stand up from chair, walk at your normal speed across the tape on the floor, turn around, and come back to sit in the chair. Continue counting backwards the entire time." TUG: ________seconds; Dual Task TUG: ________seconds X(2) Normal: No noticeable change in sitting, standing or walking while backward counting when compared to TUG without Dual Task. (1) Moderate: Dual Task affects either counting OR walking (>10%) when compared to the TUG without Dual Task. (0) Severe: Stops counting while walking OR stops walking while counting. When scoring item 14, if subject's gait speed slows more than 10% between the TUG without and with a Dual Task the score should be decreased by a point. TOTAL SCORE: _____18___/28  PT Education - 02/26/17 1242    Education provided Yes   Education Details See Self-care:  discussed fall prevention, POC/progress towards goals; potential use of U-step   Person(s) Educated Patient;Spouse   Methods Explanation   Comprehension Verbalized understanding          PT Short Term Goals - 02/07/17 1020      PT SHORT TERM GOAL #1   Title Pt will perform HEP for balance, strength/stretching, gait with wife's supervision.  TARGET 01/24/17   Time 4   Period Weeks   Status On-going     PT SHORT TERM GOAL #2   Title Pt will perform at least 8 of 10 reps of sit<>stand transfers, from 18" surfaces or lower, with no UE support, no posterior LOB, for improved efficiency and safety with transfers.   Baseline with supervision and cues 02/07/17   Time  4   Period Weeks   Status Partially Met     PT SHORT TERM GOAL #3   Title Pt will improve MiniBESTest score to at least 18/28 for decreased fall risk.   Time 4   Period Weeks   Status Achieved     PT SHORT TERM GOAL #4   Title Pt will verbalize understanding of tips to reduce freezing and festination of gait and turns.   Baseline Wife present for this education x 2 as well.   Time 4   Period Weeks   Status Achieved           PT Long Term Goals - 02/26/17 0936      PT LONG TERM GOAL #1   Title Pt will verbalize understanding of fall prevention in home environment.  TARGET 02/24/17   Time 8   Period Weeks   Status Achieved     PT LONG TERM GOAL #2   Title Pt will report at least 50% improvement in car transfers.   Baseline Per pt report   Time 8   Period Weeks   Status Achieved     PT LONG TERM GOAL #3   Title Pt will improve MiniBESTest score to at least 22/28 for decreased fall risk.   Baseline MiniBESTest 18/28 02/26/17   Time 8   Period Weeks   Status Not Met     PT LONG TERM GOAL #4   Title Pt will improve TUG and TUG cognitive score to within <10% difference, for improved dual tasking with gait.   Time 8   Period Weeks   Status Achieved     PT LONG TERM GOAL #5   Title Pt will perform floor>stand transfer, modified independently, for improved safety with fall recovery.   Baseline Needs min guard assistance   Time 8   Period Weeks   Status Not Met               Plan - 02/26/17 2000    Clinical Impression Statement Assessed remaining LTGs today-LTG 1, 2, 4 met.  LTG 3 and 5 not met.  Pt continues to have difficulty on compliant surfaces and with balance reactions to prevent LOB without assistance or external support.  Had long discussion regarding safety with gait, overall awareness of PD deficits and fall risk.  Pt and wife feel that he has improved awareness of movement patterns, despite recent falls.  Wife also feels pt is doing slightly better  since being taken off 2 PD-related medications when he saw neurologist in [place].  Those factors, in addition to pt's inconsistent attendance  due to being out of town contribute to discussion with plans to continue PT for additional 4 weeks to work on balance and gait.    Rehab Potential Good   PT Frequency 2x / week   PT Duration 4 weeks  per recert 6/38/93, plux 1x/ 1wk to cover 02/26/17 visit   PT Treatment/Interventions ADLs/Self Care Home Management;Functional mobility training;Gait training;Therapeutic activities;Therapeutic exercise;Balance training;Neuromuscular re-education;Patient/family education   PT Next Visit Plan Pt amendable to continueing PT to further address balance and gait training; continue fall prevention education and update HEP as needed   Consulted and Agree with Plan of Care Patient;Family member/caregiver   Family Member Consulted wife, [name]      Patient will benefit from skilled therapeutic intervention in order to improve the following deficits and impairments:  Abnormal gait, Decreased balance, Decreased safety awareness, Decreased strength, Difficulty walking, Impaired flexibility, Postural dysfunction  Visit Diagnosis: Other abnormalities of gait and mobility  Unsteadiness on feet  Other symptoms and signs involving the nervous system  Abnormal posture     Problem List Patient Active Problem List   Diagnosis Date Noted  . 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 Acute urinary retention 11/23/2012  . BPH (benign prostatic hyperplasia) 11/23/2012    Jais Demir W. 02/27/2017, 1:15 PM  Frazier Butt., PT   [facility] [address] [place], [place], [address] Phone: [phone]   Fax:  [phone]  Name: NASON CONRADT MRN: [id number] Date of Birth: 25-Nov-1955

## 2017-02-27 NOTE — Therapy (Signed)
[facility] [address] [place], [place], [address] Phone: [phone]   Fax:  [phone]  Occupational Therapy Treatment  Patient Details  Name: Adam Cordova MRN: [id number] Date of Birth: Feb 01, 1956 Referring Provider: Gwendolyn Fill, PA  Encounter Date: 02/26/2017      OT End of Session - 02/26/17 1732    Visit Number 10   Number of Visits 17   Date for OT Re-Evaluation 02/24/17   Authorization Type [employer], no visit limit, no auth req; G-code needed   Authorization - Visit Number 10   Authorization - Number of Visits 10   OT Start Time 1024  late start- 2 units only   OT Stop Time 1100   OT Time Calculation (min) 36 min   Activity Tolerance Patient tolerated treatment well   Behavior During Therapy [facility] for tasks assessed/performed      Past Medical History:  Diagnosis Date  . BPH (benign prostatic hypertrophy) with urinary retention   . Foley catheter in place   . Parkinson disease [facility])    neurologist--  dr Richardean Chimera  in [place]  . UTI (urinary tract infection)     Past Surgical History:  Procedure Laterality Date  . TRANSURETHRAL RESECTION OF PROSTATE N/A 11/23/2012   Procedure: TRANSURETHRAL RESECTION OF THE PROSTATE WITH GYRUS INSTRUMENTS;  Surgeon: Bernestine Amass, MD;  Location: [facility];  Service: Urology;  Laterality: N/A;    There were no vitals filed for this visit.      Subjective Assessment - 02/26/17 1025    Currently in Pain? No/denies             Therapist checked progress towards goals and reviewed with pt and wife.  Therapist discussed scheduling a screen in 6 mons after pt finishes PT. Pt/ and wife agree with plans for d/c.                   OT Short Term Goals - 02/26/17 1025      OT SHORT TERM GOAL #1   Title Pt/family will be independent with PD-specific HEP.--check STGs 02/08/17 (extended due to being seen for decr  frequency)   Time 4   Period Weeks   Status Achieved     OT SHORT TERM GOAL #2   Title Pt will improve coordination for ADLs as shown by improving time on 9-hole peg test by at least 4sec with dominant RUE.   Time 4   Period Weeks   Status Achieved     OT SHORT TERM GOAL #3   Title Pt will be able to don/doff jacket safely mod I (PPT#4 with jacket) in 90sec or less.   Time 4   Period Weeks   Status Partially Met  met in seated 13.06 secs     OT SHORT TERM GOAL #4   Title Pt/wife will report incr ability to perform bilateral coordinated ADL tasks as shown by fastening/unfastening 3 buttons in less than 11mn utilizing strategies.   Time 4   Period Weeks   Status Not Met     OT SHORT TERM GOAL #5   Title Pt will demonstrate improved bilateral UE function for ADLs as evidenced by increasing box / blocks by 3 blocks bilaterally.   Time 4   Period Weeks   Status Not Met     OT SHORT TERM GOAL #6   Title Pt will write 3 sentences with at least 90% legibility and only  min decr in size.   Time 4   Period Weeks   Status Achieved           OT Long Term Goals - 02/26/17 1034      OT LONG TERM GOAL #1   Title Pt/caregiver will verbalize understanding of adaptive strategies/AE for ADLs (including strategies for eating, writing, bed mobility, dressing, carrying objects, IADLs).--check LTGs 02/24/17   Time 8   Period Weeks   Status Achieved     OT LONG TERM GOAL #2   Title Pt will improve coordination for ADLs as shown by improving time on 9-hole peg test by at least 8sec with dominant RUE.   Baseline R-47.69sec   Time 8   Period Weeks   Status Achieved  34.37 secs     OT LONG TERM GOAL #3   Title Pt will report incr ease with eating (getting food on utensil, less spills, and incr ease with cutting).   Time 8   Period Weeks   Status Partially Met  difficulty loading, improved ability to cut food.     OT LONG TERM GOAL #4   Title Pt will be able to don/doff jacket  safely mod I (PPT#4 with jacket) in 60sec or less.   Time 8   Period Weeks   Status Partially Met  met in seated 13.06 secs     OT LONG TERM GOAL #5   Title Pt will write 3 sentences with at least 100% legibility and only min decr in size.   Time 8   Period Weeks   Status Achieved     OT LONG TERM GOAL #6   Title Pt will write 3 sentences with at least 100% legibility and only min decr in size.   Time 8   Period Weeks   Status Achieved     OT LONG TERM GOAL #7   Title Pt will be able to reach at least 10" bilaterall for improved balance/decr fall risk for ADLs/IADLs.   Time 8   Period Weeks   Status Not Met  RUE 7.5, LUE 7               Plan - 02/27/17 1618    Clinical Impression Statement Pt demonstrates progress in handwriting and PPT#4 in seated. Pt did not fully meet all goals due to cognitive and balance deficits. 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   Rehab Potential Fair   Current Impairments/barriers affecting progress: cognitive deficits, severity of deficits, visual deficits   OT Frequency 2x / week   OT Duration 6 weeks   OT Treatment/Interventions Self-care/ADL training;Moist Heat;Fluidtherapy;DME and/or AE instruction;Splinting;Patient/family education;Balance training;Therapeutic exercises;Therapeutic activities;Therapeutic exercise;Ultrasound;Neuromuscular education;Functional Mobility Training;Passive range of motion;Cognitive remediation/compensation;Visual/perceptual remediation/compensation;Manual Therapy;Energy conservation;Parrafin;Cryotherapy   Plan d/c OT   OT Home Exercise Plan Education provided:  issued coordination HEP; PWR! hands (basic 4), PWR! seated basic 4, adapted strateiges for cutting food and donning jacket   Consulted and Agree with Plan of Care Patient;Family member/caregiver      Patient will benefit from skilled therapeutic intervention in order to improve the following deficits and  impairments:  Decreased cognition, Decreased mobility, Impaired perceived functional ability, Impaired vision/preception, Impaired UE functional use, Decreased knowledge of use of DME, Decreased balance, Decreased activity tolerance, Impaired tone, Improper spinal/pelvic alignment, Decreased safety awareness, Decreased coordination, Decreased range of motion, Abnormal gait  Visit Diagnosis: Other abnormalities of gait and mobility  Unsteadiness on feet  Other symptoms and signs  involving the nervous system  Abnormal posture  Other symptoms and signs involving the musculoskeletal system  Attention and concentration deficit  Other lack of coordination  Visuospatial deficit  Frontal lobe and executive function deficit      G-Codes - 2017/03/23 1614    Functional Assessment Tool Used (Outpatient only) PPT#4 :13.06 in seated , improved legibility with writing, pt wrote 3 sentences with 100% legibility and good letter size, pt continues to experience falls   Functional Limitation Self care   Self Care Goal Status (K9179) At least 20 percent but less than 40 percent impaired, limited or restricted   Self Care Discharge Status [phone]) At least 40 percent but less than 60 percent impaired, limited or restricted    OCCUPATIONAL THERAPY DISCHARGE SUMMARY   Current functional level related to goals / functional outcomes: Pt made progress yet did not fully achieve all goals due to significant cognitive deficits and continued decreased balance   Remaining deficits: Decreased balance, decreased coordination, cognitive deficits,abnormal posture, bradykinesia    Education / Equipment: Pt/ wife were instructed in HEP and adapted strategies for ADLS to improve safety and independence. Pt / wife verbalize understanding. Plan: Patient agrees to discharge.  Patient goals were partially met. Patient is being discharged due to being pleased with the current functional level.  ?????      Problem  List Patient Active Problem List   Diagnosis Date Noted  . 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 Acute urinary retention 11/23/2012  . BPH (benign prostatic hyperplasia) 11/23/2012    RINE,KATHRYN 03-23-2017, 4:20 PM  [facility] [address] [place], [place], [address] Phone: [phone]   Fax:  [phone]  Name: Adam Cordova MRN: [id number] Date of Birth: [birth year]

## 2017-03-10 ENCOUNTER — Ambulatory Visit: Payer: PPO | Attending: Neurology | Admitting: Physical Therapy

## 2017-03-10 DIAGNOSIS — R293 Abnormal posture: Secondary | ICD-10-CM | POA: Insufficient documentation

## 2017-03-10 DIAGNOSIS — R29818 Other symptoms and signs involving the nervous system: Secondary | ICD-10-CM

## 2017-03-10 DIAGNOSIS — R2681 Unsteadiness on feet: Secondary | ICD-10-CM | POA: Diagnosis not present

## 2017-03-10 DIAGNOSIS — R2689 Other abnormalities of gait and mobility: Secondary | ICD-10-CM | POA: Diagnosis not present

## 2017-03-10 NOTE — Therapy (Signed)
[facility] [address] [place], [place], [address] Phone: [phone]   Fax:  [phone]  Physical Therapy Treatment  Patient Details  Name: Adam Cordova MRN: [id number] Date of Birth: 05-14-1956 Referring Provider: [name], PA  Encounter Date: 03/10/2017      PT End of Session - 03/10/17 1208    Visit Number 9   Number of Visits 17   Date for PT Re-Evaluation 04/27/17   Authorization Type [employer] -GCODE every 10th visit   PT Start Time 0802   PT Stop Time 0845   PT Time Calculation (min) 43 min   Equipment Utilized During Treatment Gait belt   Activity Tolerance Patient tolerated treatment well   Behavior During Therapy [facility] for tasks assessed/performed      Past Medical History:  Diagnosis Date  . BPH (benign prostatic hypertrophy) with urinary retention   . Foley catheter in place   . Parkinson disease [facility])    neurologist--  dr Richardean Chimera  in [place]  . UTI (urinary tract infection)     Past Surgical History:  Procedure Laterality Date  . TRANSURETHRAL RESECTION OF PROSTATE N/A 11/23/2012   Procedure: TRANSURETHRAL RESECTION OF THE PROSTATE WITH GYRUS INSTRUMENTS;  Surgeon: Bernestine Amass, MD;  Location: [facility];  Service: Urology;  Laterality: N/A;    There were no vitals filed for this visit.      Subjective Assessment - 03/10/17 0806    Subjective Had a good trip to [place].  No falls.  Do feel that I'm have more episodes of freezing.   Patient is accompained by: --  spoke with wife, [name], at end of session   Pertinent History Parkinson's disease; Update from neurologist visit in [place] 02/19/17-possible [place] Body disease or [place]   Patient Stated Goals Pt's goals for therapy are to "dramatically improve my sense of balance."   Currently in Pain? No/denies                         [facility] Adult PT Treatment/Exercise - 03/10/17 1205      High  Level Balance   High Level Balance Comments Performed squatting and initiation of stepping activities on red mat surface, to incorporate 1)squatting to pick up objects with widened BOS, 2) slowed/control return up to stand with use of visual targets to prevent quick stand up and posterior loss of balance, 3) side step to initiate stepping to varied placement of cones.  Pt requires verbal cues from PT and min guard/min assistance with one episode of strong posterior lean upon standing.             Balance Exercises - 03/10/17 0820      Balance Exercises: Standing   Rockerboard Anterior/posterior;Head turns;EO  Head nods x 10 reps; hip/ankle strategy x 20 each   Balance Beam On blue foam balance beam:  marching in place x 20 reps, alternating forward kicks x 20 reps, alternating forward step taps x 20 reps.  Frequent verbal cues to widen BOS and to have full contact on beam with L foot (when he has narrowed BOS, he tends to have increased supination/inversion with LLE).  Single leg forwad>back step taps x 10 reps each leg.  Minisquats x 10 reps.  Varied/unpredictable activities standing on balance beam-with therapist calling out instructions to vary activities-minisquats, UE lifts, head turns, head nods, with min guard/min assist for balance.    Other Standing Exercises On  rockerboard:  balance in middle of board with alternating arm swing, bilateral UE lifts, UE/trunk rotation, x 10 reps each.             PT Education - 03/10/17 1207    Education provided Yes   Education Details technique for squatting to pick up objects from floor; safety with coming up to stand from squat position   Person(s) Educated Patient   Methods Explanation;Demonstration;Verbal cues;Handout   Comprehension Verbalized understanding;Returned demonstration;Need further instruction             PT Long Term Goals - 02/26/17 0936      PT LONG TERM GOAL #1   Title Pt will improve MiniBESTest score to at least  20/28 for decreased fall risk.  Updated TARGET 03/27/17   Baseline MiniBESTest score 18/28 on 02/26/17   Time 4   Period Weeks   Status New     PT LONG TERM GOAL #2   Title Pt will demonstrate floor>stand transfer with UE support, modified independently, for improved safety with fall recovery.  TARGET 03/27/17   Baseline Needs min guard assistance, unsteady upon standing   Time 4   Period Weeks   Status New     PT LONG TERM GOAL #3   Title Pt will report decrease in falls in home environment, by at least 50%, for improved balance and safety in home environment.   Baseline --   Time 4   Period Weeks   Status New     PT LONG TERM GOAL #4   Title --   Time --   Period --   Status --     PT LONG TERM GOAL #5   Title --   Baseline --   Time --   Period --   Status --               Plan - 03/10/17 1208    Clinical Impression Statement Skilled PT session focused on compliant surface with hip/ankle strategy and varied>unpredictable activities, needing min guard/min assist to regain balance at times.  Also addressed functional activity of squatting to pick up objects from floor and returning to stand without strong posterior lean, needing min assist at times.  Pt will continue to benefit from targeted functional activities, compliant surface and balance strategy work.   Rehab Potential Good   PT Frequency 2x / week   PT Duration 4 weeks  per recert 8/76/81, plux 1x/ 1wk to cover 02/26/17 visit   PT Treatment/Interventions ADLs/Self Care Home Management;Functional mobility training;Gait training;Therapeutic activities;Therapeutic exercise;Balance training;Neuromuscular re-education;Patient/family education   PT Next Visit Plan Compliant surface, balance strategy, gait training; squatting/floor transfers; update HEP and continue fall prevention as needed.  GCODE every 10th visit.   Consulted and Agree with Plan of Care Patient      Patient will benefit from skilled therapeutic  intervention in order to improve the following deficits and impairments:  Abnormal gait, Decreased balance, Decreased safety awareness, Decreased strength, Difficulty walking, Impaired flexibility, Postural dysfunction  Visit Diagnosis: Unsteadiness on feet  Other symptoms and signs involving the nervous system     Problem List Patient Active Problem List   Diagnosis Date Noted  . 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 Acute urinary retention 11/23/2012  . BPH (benign prostatic hyperplasia) 11/23/2012    Tryson Lumley W. 03/10/2017, 12:12 PM Frazier Butt., PT  [facility] [address] [place], [place], [address] Phone: [phone]   Fax:  [phone]  Name:  LINLEY MOXLEY MRN: [id number] Date of Birth: 08/29/1956

## 2017-03-10 NOTE — Patient Instructions (Signed)
   When picking objects up from the floor:  -make sure you are close to the object you are picking up  -make sure your feet are wide (slightly wider than your shoulders)  -Squat down to pick up the object and SLOWLY come back up to standing (don't come up too fast or too far, in order to avoid losing balance backwards)

## 2017-03-12 ENCOUNTER — Ambulatory Visit: Payer: PPO | Admitting: Physical Therapy

## 2017-03-12 DIAGNOSIS — R29818 Other symptoms and signs involving the nervous system: Secondary | ICD-10-CM

## 2017-03-12 DIAGNOSIS — R2681 Unsteadiness on feet: Secondary | ICD-10-CM

## 2017-03-12 DIAGNOSIS — R2689 Other abnormalities of gait and mobility: Secondary | ICD-10-CM

## 2017-03-12 NOTE — Patient Instructions (Signed)
   Try to wear your lace up ankle brace to add stability to your left ankle and prevent it from rolling.   If this isn't comfortable or doesn't fully control your ankle, you could try an air/gel cast brace (Mueller air gel cast is an option)

## 2017-03-13 NOTE — Therapy (Signed)
[facility] [address] [place], [place], [address] Phone: [phone]   Fax:  [phone]  Physical Therapy Treatment  Patient Details  Name: Adam Cordova MRN: [id number] Date of Birth: 1956/06/28 Referring Provider: [name], PA  Encounter Date: 03/12/2017      PT End of Session - 03/13/17 2145    Visit Number 10   Number of Visits 17   Date for PT Re-Evaluation 04/27/17   Authorization Type [employer] -GCODE every 10th visit   PT Start Time 0934   PT Stop Time 1018   PT Time Calculation (min) 44 min   Equipment Utilized During Treatment Gait belt   Activity Tolerance Patient tolerated treatment well   Behavior During Therapy [name] for tasks assessed/performed      Past Medical History:  Diagnosis Date  . BPH (benign prostatic hypertrophy) with urinary retention   . Foley catheter in place   . Parkinson disease [facility])    neurologist--  dr Richardean Chimera  in [place]  . UTI (urinary tract infection)     Past Surgical History:  Procedure Laterality Date  . TRANSURETHRAL RESECTION OF PROSTATE N/A 11/23/2012   Procedure: TRANSURETHRAL RESECTION OF THE PROSTATE WITH GYRUS INSTRUMENTS;  Surgeon: Bernestine Amass, MD;  Location: [facility];  Service: Urology;  Laterality: N/A;    There were no vitals filed for this visit.      Subjective Assessment - 03/13/17 2136    Subjective Took the dog out and went to get her in the landscaping; lost balance and fell backwards.  Didn't get hurt, though   Pertinent History Parkinson's disease; Update from neurologist visit in [place] 02/19/17-possible [place] Body disease or [place]   Patient Stated Goals Pt's goals for therapy are to "dramatically improve my sense of balance."   Currently in Pain? No/denies                         [facility] Adult PT Treatment/Exercise - 03/13/17 2136      Ambulation/Gait   Ambulation/Gait Yes   Ambulation/Gait  Assistance 4: Min guard;5: Supervision   Ambulation/Gait Assistance Details Trialed air cast on LLE, due to increased inversion/supination noted on compliant surfaces.  With gait and compliant surface activites, pt noted to have improved neutral positioning on L ankle.   Ambulation Distance (Feet) 230 Feet  x 2   Assistive device None   Gait Pattern Step-through pattern;Decreased arm swing - right;Decreased arm swing - left;Decreased dorsiflexion - right;Decreased dorsiflexion - left;Decreased trunk rotation;Narrow base of support   Ambulation Surface Level;Indoor   Pre-Gait Activities Discussed the possibility of using air cast or lace up ankle brace for improved L ankle stability-pt verbalizes that he has one at home he may try to use.   Gait Comments Gait with starts/stops and with attempts at conversation.  With added distraction of conversation, pt has LOB needing therapist assistance to recover.     High Level Balance   High Level Balance Comments Performed squatting and initiation of stepping activities on red mat surface, to incorporate 1)squatting to pick up objects with widened BOS, 2) slowed/control return up to stand with use of visual targets to prevent quick stand up and posterior loss of balance, 3) side step or back step to initiate stepping to varied placement of cones.  Pt requires verbal cues from PT and min guard/min assistance.  Balance Exercises - 03/13/17 2137      Balance Exercises: Standing   Rockerboard Anterior/posterior;Head turns;EO  head nods x 10, hip/ankle strategy x 20 each   Balance Beam On blue foam compliant surface standing at counter:  marching in place x 20 reps, alternating forward kicks x 20 reps, alternating forward step taps x 20 reps.  Frequent verbal cues to widen BOS and to have full contact on beam with L foot (when he has narrowed BOS, he tends to have increased supination/inversion with LLE).  Single leg forwad>back step taps x 10  reps each leg.  Minisquats x 10 reps.  Varied/unpredictable activities standing on balance beam-with therapist calling out instructions to vary activities-minisquats, UE lifts/reaching to cabinets, head turns, head nods, with min guard/min assist for balance.    Other Standing Exercises On rockerboard:  balance in middle of board with alternating arm swing, bilateral UE lifts, UE/trunk rotation, x 10 reps each.             PT Education - 03/13/17 2144    Education provided Yes   Education Details Potential use/benefit of air cast or lace up ankle brace to provide added stability to avoid excess supination/inversion on LLE with compliant surfaces and gait.   Person(s) Educated Patient   Methods Explanation;Demonstration;Handout   Comprehension Verbalized understanding;Returned demonstration          PT Short Term Goals - 02/07/17 1020      PT SHORT TERM GOAL #1   Title Pt will perform HEP for balance, strength/stretching, gait with wife's supervision.  TARGET 01/24/17   Time 4   Period Weeks   Status On-going     PT SHORT TERM GOAL #2   Title Pt will perform at least 8 of 10 reps of sit<>stand transfers, from 18" surfaces or lower, with no UE support, no posterior LOB, for improved efficiency and safety with transfers.   Baseline with supervision and cues 02/07/17   Time 4   Period Weeks   Status Partially Met     PT SHORT TERM GOAL #3   Title Pt will improve MiniBESTest score to at least 18/28 for decreased fall risk.   Time 4   Period Weeks   Status Achieved     PT SHORT TERM GOAL #4   Title Pt will verbalize understanding of tips to reduce freezing and festination of gait and turns.   Baseline Wife present for this education x 2 as well.   Time 4   Period Weeks   Status Achieved           PT Long Term Goals - 02/26/17 0936      PT LONG TERM GOAL #1   Title Pt will improve MiniBESTest score to at least 20/28 for decreased fall risk.  Updated TARGET 03/27/17    Baseline MiniBESTest score 18/28 on 02/26/17   Time 4   Period Weeks   Status New     PT LONG TERM GOAL #2   Title Pt will demonstrate floor>stand transfer with UE support, modified independently, for improved safety with fall recovery.  TARGET 03/27/17   Baseline Needs min guard assistance, unsteady upon standing   Time 4   Period Weeks   Status New     PT LONG TERM GOAL #3   Title Pt will report decrease in falls in home environment, by at least 50%, for improved balance and safety in home environment.   Baseline --   Time 4  Period Weeks   Status New     PT LONG TERM GOAL #4   Title --   Time --   Period --   Status --     PT LONG TERM GOAL #5   Title --   Baseline --   Time --   Period --   Status --               Plan - 03/13/17 11/06/45    Clinical Impression Statement Continued to work on compliant surface balance, hip/ankle strategy and gait activities this visit.  Pt continues to need min guard/min assist for balance.  Pt noted to have excess supination and inversion on LLE on compliant surfaces this week, and trialed aircast, which provided good stability on compliant surfaces and with gait activities.  Pt will continue to benefit from targeted functional activities, compliant surface and balance strategy work.   Rehab Potential Good   PT Frequency 2x / week   PT Duration 4 weeks  per recert 4/53/64, plux 1x/ 1wk to cover 02/26/17 visit   PT Treatment/Interventions ADLs/Self Care Home Management;Functional mobility training;Gait training;Therapeutic activities;Therapeutic exercise;Balance training;Neuromuscular re-education;Patient/family education   PT Next Visit Plan Continue Compliant surface, balance strategy, gait training; squatting/floor transfers; update HEP and continue fall prevention as needed.   Consulted and Agree with Plan of Care Patient      Patient will benefit from skilled therapeutic intervention in order to improve the following deficits  and impairments:  Abnormal gait, Decreased balance, Decreased safety awareness, Decreased strength, Difficulty walking, Impaired flexibility, Postural dysfunction  Visit Diagnosis: Other abnormalities of gait and mobility  Unsteadiness on feet  Other symptoms and signs involving the nervous system       G-Codes - 19-Mar-2017 11-07-47    Functional Assessment Tool Used (Outpatient Only) MiniBESTest 18/28, TUG 9.25 sec, 1 fall in the past week   Functional Limitation Mobility: Walking and moving around   Mobility: Walking and Moving Around Current Status [phone]) At least 20 percent but less than 40 percent impaired, limited or restricted      Problem List Patient Active Problem List   Diagnosis Date Noted  . 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 Acute urinary retention 11/23/2012  . BPH (benign prostatic hyperplasia) 11/23/2012    [place] Gadbois W. 03/13/2017, 9:51 PM  Frazier Butt., PT   [facility] [address] [place], [place], [address] Phone: [phone]   Fax:  [phone]  Name: Adam Cordova MRN: [id number] Date of Birth: May 22, 1956   Physical Therapy Progress Note  Dates of Reporting Period: 12/26/16 to Mar 19, 2017  Objective Reports of Subjective Statement: MiniBEStest has fluctuated with assessement; pt reports one fall since last PT visit; several falls over the past month.  Objective Measurements: MiniBESTest 18/28 (improved from 13/28 at eval, but has decreased >18/28 prior to most recent assessment); TUG 9.25 sec  Goal Update: See goals above  Plan: Plan to continue PT to address compliant surface, balance strategies and fall prevention.    Reason Skilled Services are Required: Pt has been seen inconsistently due to pt's trips out of town; also he has had medication adjustments per neurologist in [place].  Pt will benefit from skilled PT to address balance and gait for improved functional mobility and decreased  falls.   Mady Haagensen, PT 03/13/17 9:57 PM Phone: [phone] Fax: [phone]

## 2017-03-17 ENCOUNTER — Ambulatory Visit: Payer: PPO | Admitting: Physical Therapy

## 2017-03-17 DIAGNOSIS — R2681 Unsteadiness on feet: Secondary | ICD-10-CM | POA: Diagnosis not present

## 2017-03-17 DIAGNOSIS — R29818 Other symptoms and signs involving the nervous system: Secondary | ICD-10-CM

## 2017-03-17 NOTE — Therapy (Signed)
[facility] [address] [place], [place], [address] Phone: [phone]   Fax:  [phone]  Physical Therapy Treatment  Patient Details  Name: Adam Cordova MRN: [id number] Date of Birth: March 25, 1956 Referring Provider: [name], PA  Encounter Date: 03/17/2017      PT End of Session - 03/17/17 1205    Visit Number 11   Number of Visits 17   Date for PT Re-Evaluation 04/27/17   Authorization Type [employer] every 10th visit   PT Start Time 0804   PT Stop Time 0843   PT Time Calculation (min) 39 min   Equipment Utilized During Treatment Gait belt   Activity Tolerance Patient tolerated treatment well   Behavior During Therapy [facility] for tasks assessed/performed      Past Medical History:  Diagnosis Date  . BPH (benign prostatic hypertrophy) with urinary retention   . Foley catheter in place   . Parkinson disease [facility])    neurologist--  dr Richardean Chimera  in [place]  . UTI (urinary tract infection)     Past Surgical History:  Procedure Laterality Date  . TRANSURETHRAL RESECTION OF PROSTATE N/A 11/23/2012   Procedure: TRANSURETHRAL RESECTION OF THE PROSTATE WITH GYRUS INSTRUMENTS;  Surgeon: Bernestine Amass, MD;  Location: [facility];  Service: Urology;  Laterality: N/A;    There were no vitals filed for this visit.      Subjective Assessment - 03/17/17 0806    Subjective No falls, no changes since last week.   Pertinent History Parkinson's disease; Update from neurologist visit in [place] 02/19/17-possible [place] Body disease or Erma   Patient Stated Goals Pt's goals for therapy are to "dramatically improve my sense of balance."   Currently in Pain? No/denies                         [facility] Adult PT Treatment/Exercise - 03/17/17 0001      Self-Care   Self-Care Other Self-Care Comments   Other Self-Care Comments  Discussed again Aircast options-pt reports he found his  (lace-up ankle brace) at home, but he doesn't like it.  He and wife have looked online for Air-cast.  Helped pt to figure size (likely Large) and provided information on local medical supply stores for obtaining air-cast versus online.             Balance Exercises - 03/17/17 0807      Balance Exercises: Standing   Stepping Strategy Anterior;Posterior  in parallel bars standing on rockerboard   Rockerboard Anterior/posterior;Head turns;EO  Head nods x 10, hip/ankle strategy x 20   Balance Beam On blue foam compliant surface standing at parallel bars:  marching in place x 20 reps, alternating forward kicks x 20 reps, alternating forward step taps x 20 reps.  Frequent verbal cues to widen BOS and to have full contact on beam with L foot (when he has narrowed BOS, he tends to have increased supination/inversion with LLE).  Single leg forwad>back step taps x 10 reps each leg.  On compliant surface on ramp:  incline and decline with feet apart-head turns and head nods x 5 reps each, then marching in place x 5 reps each, with cues for full foot placement of L foot (attempted to trial use of Aircast again today, but unable to find sample Aircast in clinic)   Heel Raises Limitations x 10   Toe Raise Limitations x 10   Other Standing Exercises  On rockerboard:  balance in middle of board with alternating arm swing, bilateral UE lifts, UE/trunk rotation, x 10 reps each.       Stagger stance (at counter)-forward/back rocking x 15 reps each leg for improved dynamic weightshifting.      PT Education - 03/17/17 1204    Education provided Yes   Education Details Provided patient with Air-cast information from online as well as local medical supply stores to look for air-cast to assist with improved stability of L ankle   Person(s) Educated Patient   Methods Explanation;Demonstration;Handout   Comprehension Verbalized understanding          PT Short Term Goals - 02/07/17 1020      PT SHORT TERM  GOAL #1   Title Pt will perform HEP for balance, strength/stretching, gait with wife's supervision.  TARGET 01/24/17   Time 4   Period Weeks   Status On-going     PT SHORT TERM GOAL #2   Title Pt will perform at least 8 of 10 reps of sit<>stand transfers, from 18" surfaces or lower, with no UE support, no posterior LOB, for improved efficiency and safety with transfers.   Baseline with supervision and cues 02/07/17   Time 4   Period Weeks   Status Partially Met     PT SHORT TERM GOAL #3   Title Pt will improve MiniBESTest score to at least 18/28 for decreased fall risk.   Time 4   Period Weeks   Status Achieved     PT SHORT TERM GOAL #4   Title Pt will verbalize understanding of tips to reduce freezing and festination of gait and turns.   Baseline Wife present for this education x 2 as well.   Time 4   Period Weeks   Status Achieved           PT Long Term Goals - 02/26/17 0936      PT LONG TERM GOAL #1   Title Pt will improve MiniBESTest score to at least 20/28 for decreased fall risk.  Updated TARGET 03/27/17   Baseline MiniBESTest score 18/28 on 02/26/17   Time 4   Period Weeks   Status New     PT LONG TERM GOAL #2   Title Pt will demonstrate floor>stand transfer with UE support, modified independently, for improved safety with fall recovery.  TARGET 03/27/17   Baseline Needs min guard assistance, unsteady upon standing   Time 4   Period Weeks   Status New     PT LONG TERM GOAL #3   Title Pt will report decrease in falls in home environment, by at least 50%, for improved balance and safety in home environment.   Baseline --   Time 4   Period Weeks   Status New     PT LONG TERM GOAL #4   Title --   Time --   Period --   Status --     PT LONG TERM GOAL #5   Title --   Baseline --   Time --   Period --   Status --               Plan - 03/17/17 1205    Clinical Impression Statement Pt appears to have improved control and stability on compliant  surface activities on rockerboard and balance beam (with UE support) that we have practiced before.  Increased difficulty with balance on compliant mat surface on ramp.  Pt will continue to benefit from  targeted functional activities, compliant surfaces and balance activities toward LTGs.   Rehab Potential Good   PT Frequency 2x / week   PT Duration 4 weeks  per recert 9/82/86, plux 1x/ 1wk to cover 02/26/17 visit   PT Treatment/Interventions ADLs/Self Care Home Management;Functional mobility training;Gait training;Therapeutic activities;Therapeutic exercise;Balance training;Neuromuscular re-education;Patient/family education   PT Next Visit Plan Continue Compliant surface, balance strategy, gait training; squatting/floor transfers; update HEP and continue fall prevention as needed.   Consulted and Agree with Plan of Care Patient      Patient will benefit from skilled therapeutic intervention in order to improve the following deficits and impairments:  Abnormal gait, Decreased balance, Decreased safety awareness, Decreased strength, Difficulty walking, Impaired flexibility, Postural dysfunction  Visit Diagnosis: Unsteadiness on feet  Other symptoms and signs involving the nervous system     Problem List Patient Active Problem List   Diagnosis Date Noted  . 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 Acute urinary retention 11/23/2012  . BPH (benign prostatic hyperplasia) 11/23/2012    Curlie Macken W. 03/17/2017, 12:10 PM  Frazier Butt., PT   [facility] [address] [place], [place], [address] Phone: [phone]   Fax:  [phone]  Name: Adam Cordova MRN: [id number] Date of Birth: [birth year]

## 2017-03-19 ENCOUNTER — Ambulatory Visit: Payer: PPO | Admitting: Physical Therapy

## 2017-03-19 DIAGNOSIS — M79672 Pain in left foot: Secondary | ICD-10-CM | POA: Diagnosis not present

## 2017-03-19 DIAGNOSIS — R2681 Unsteadiness on feet: Secondary | ICD-10-CM | POA: Diagnosis not present

## 2017-03-19 DIAGNOSIS — M7750 Other enthesopathy of unspecified foot: Secondary | ICD-10-CM | POA: Diagnosis not present

## 2017-03-19 DIAGNOSIS — M216X2 Other acquired deformities of left foot: Secondary | ICD-10-CM | POA: Diagnosis not present

## 2017-03-19 DIAGNOSIS — R2689 Other abnormalities of gait and mobility: Secondary | ICD-10-CM

## 2017-03-19 DIAGNOSIS — R29818 Other symptoms and signs involving the nervous system: Secondary | ICD-10-CM

## 2017-03-20 NOTE — Therapy (Signed)
[facility] [address] [place], [place], [address] Phone: [phone]   Fax:  [phone]  Physical Therapy Treatment  Patient Details  Name: KAYZEN KENDZIERSKI MRN: [id number] Date of Birth: Jul 09, 1956 Referring Provider: [name], PA  Encounter Date: 03/19/2017      PT End of Session - 03/20/17 1201    Visit Number 12   Number of Visits 17   Date for PT Re-Evaluation 04/27/17   Authorization Type [employer] -[employer] every 10th visit   PT Start Time 0847   PT Stop Time 0932   PT Time Calculation (min) 45 min   Equipment Utilized During Treatment Gait belt   Activity Tolerance Patient tolerated treatment well   Behavior During Therapy [facility] for tasks assessed/performed      Past Medical History:  Diagnosis Date  . BPH (benign prostatic hypertrophy) with urinary retention   . Foley catheter in place   . Parkinson disease [facility])    neurologist--  dr Richardean Chimera  in [place]  . UTI (urinary tract infection)     Past Surgical History:  Procedure Laterality Date  . TRANSURETHRAL RESECTION OF PROSTATE N/A 11/23/2012   Procedure: TRANSURETHRAL RESECTION OF THE PROSTATE WITH GYRUS INSTRUMENTS;  Surgeon: Bernestine Amass, MD;  Location: [facility];  Service: Urology;  Laterality: N/A;    There were no vitals filed for this visit.      Subjective Assessment - 03/19/17 0849    Subjective No falls, no changes since last week.  Will be going to orthopedist today to look at my L foot again. Had been about a year ago for the way it sticks out on outside of L foot.  We haven't gotten the aircast brace yet.   Pertinent History Parkinson's disease; Update from neurologist visit in [place] 02/19/17-possible [place] Body disease or Gleason   Patient Stated Goals Pt's goals for therapy are to "dramatically improve my sense of balance."   Currently in Pain? Yes   Pain Score 3    Pain Location Foot   Pain Orientation  Left;Lateral   Pain Descriptors / Indicators Burning   Pain Type Chronic pain  saw orthopedist 1 year ago and to see again today   Pain Onset More than a month ago   Pain Frequency Intermittent   Aggravating Factors  standing too long   Pain Relieving Factors going barefoot alleviates pain                         [facility] Adult PT Treatment/Exercise - 03/20/17 0001      Ambulation/Gait   Ambulation/Gait Yes   Ambulation/Gait Assistance 4: Min guard;5: Supervision   Ambulation/Gait Assistance Details Used air cast for entire session with gait.  No episodes of L ankle instablity noted with gait activities.  Worked on start/stop and change of direction.  Pt better able to regain balance in posterior direction without uncontrolled stepping and LOB   Ambulation Distance (Feet) 120 Feet  x 2, then 400 ft x 2   Assistive device None   Gait Pattern Step-through pattern;Decreased arm swing - right;Decreased arm swing - left;Decreased dorsiflexion - right;Decreased dorsiflexion - left;Decreased trunk rotation;Narrow base of support   Ambulation Surface Level;Indoor     High Level Balance   High Level Balance Comments Four square step activity on solid surface and on compliant surface, 5 reps each.  When pt returns to start position (by taking step backward), needs  cues for widened BOS and STOP to regain posture/balance to avoid excess posterior lean/step/loss of balance.  Pt able to regain balance with cues only after the first trial.  On red compliant mat surface-forward stepping, side stepping and backwards stepping along perimeter of mat, with min guard and cues for widened BOS and slowed pace.  Pt performs squatting on compliant mat surface to pick up canes from four square step activity, with supervision, x 4 reps             Balance Exercises - 03/20/17 1050      Balance Exercises: Standing   Stepping Strategy Anterior;Posterior  in parallel bars standing on rockerboard    Rockerboard Anterior/posterior;Head turns;EO;Lateral  Head nods, hip/ankle strategy A/P 20 reps, lateral x 20   Balance Beam On blue foam compliant surface standing at parallel bars:  marching in place x 20 reps, alternating forward kicks x 20 reps, alternating forward step taps x 20 reps.  Forward step and back step, x 10 reps on each leg.  PT provides verbal cues to widen BOS.  Pt wears sample Aircast on LLE today during all PT activities, with significant reduction in L foot inversion/supination with weightbearing.   Other Standing Exercises On rockerboard:  balance in middle of board with alternating arm swing, bilateral UE lifts, UE/trunk rotation, x 10 reps each.             PT Education - 03/20/17 1206    Education provided Yes   Education Details Encouraged patient to talk with neurologist if increasing inversion/supination presents as dystonia, more during off-times of medication   Person(s) Educated Patient;Spouse   Methods Explanation   Comprehension Verbalized understanding          PT Short Term Goals - 02/07/17 1020      PT SHORT TERM GOAL #1   Title Pt will perform HEP for balance, strength/stretching, gait with wife's supervision.  TARGET 01/24/17   Time 4   Period Weeks   Status On-going     PT SHORT TERM GOAL #2   Title Pt will perform at least 8 of 10 reps of sit<>stand transfers, from 18" surfaces or lower, with no UE support, no posterior LOB, for improved efficiency and safety with transfers.   Baseline with supervision and cues 02/07/17   Time 4   Period Weeks   Status Partially Met     PT SHORT TERM GOAL #3   Title Pt will improve MiniBESTest score to at least 18/28 for decreased fall risk.   Time 4   Period Weeks   Status Achieved     PT SHORT TERM GOAL #4   Title Pt will verbalize understanding of tips to reduce freezing and festination of gait and turns.   Baseline Wife present for this education x 2 as well.   Time 4   Period Weeks   Status  Achieved           PT Long Term Goals - 02/26/17 0936      PT LONG TERM GOAL #1   Title Pt will improve MiniBESTest score to at least 20/28 for decreased fall risk.  Updated TARGET 03/27/17   Baseline MiniBESTest score 18/28 on 02/26/17   Time 4   Period Weeks   Status New     PT LONG TERM GOAL #2   Title Pt will demonstrate floor>stand transfer with UE support, modified independently, for improved safety with fall recovery.  TARGET 03/27/17   Baseline Needs  min guard assistance, unsteady upon standing   Time 4   Period Weeks   Status New     PT LONG TERM GOAL #3   Title Pt will report decrease in falls in home environment, by at least 50%, for improved balance and safety in home environment.   Baseline --   Time 4   Period Weeks   Status New     PT LONG TERM GOAL #4   Title --   Time --   Period --   Status --     PT LONG TERM GOAL #5   Title --   Baseline --   Time --   Period --   Status --               Plan - 03/20/17 1201    Clinical Impression Statement Pt wears trial AirCast for entire session today, and pt has decreased incidence of supination and inversion, decreased L ankle instability, and improved ability to regain balance.  Pt will continue to benefit from skilled PT to address balance, compliant surfaces and functional activities.   Rehab Potential Good   PT Frequency 2x / week   PT Duration 4 weeks  per recert 2/70/35, plux 1x/ 1wk to cover 02/26/17 visit   PT Treatment/Interventions ADLs/Self Care Home Management;Functional mobility training;Gait training;Therapeutic activities;Therapeutic exercise;Balance training;Neuromuscular re-education;Patient/family education   PT Next Visit Plan Continue Compliant surface, balance strategy, gait training; squatting/floor transfers; update HEP and continue fall prevention as needed.  Use sample AirCast, or pt's if he has it for imrpoved ankle stability.   Consulted and Agree with Plan of Care Patient       Patient will benefit from skilled therapeutic intervention in order to improve the following deficits and impairments:  Abnormal gait, Decreased balance, Decreased safety awareness, Decreased strength, Difficulty walking, Impaired flexibility, Postural dysfunction  Visit Diagnosis: Unsteadiness on feet  Other symptoms and signs involving the nervous system  Other abnormalities of gait and mobility     Problem List Patient Active Problem List   Diagnosis Date Noted  . 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 Acute urinary retention 11/23/2012  . BPH (benign prostatic hyperplasia) 11/23/2012    Davisha Linthicum W. 03/20/2017, 12:08 PM Frazier Butt., PT  [facility] [address] [place], [place], [address] Phone: [phone]   Fax:  [phone]  Name: RAHMIR BEEVER MRN: [id number] Date of Birth: Jan 07, 1956

## 2017-03-24 ENCOUNTER — Encounter: Payer: Self-pay | Admitting: Neurology

## 2017-03-26 ENCOUNTER — Ambulatory Visit: Payer: PPO | Admitting: Physical Therapy

## 2017-03-26 DIAGNOSIS — R2681 Unsteadiness on feet: Secondary | ICD-10-CM | POA: Diagnosis not present

## 2017-03-26 DIAGNOSIS — R2689 Other abnormalities of gait and mobility: Secondary | ICD-10-CM

## 2017-03-26 DIAGNOSIS — R293 Abnormal posture: Secondary | ICD-10-CM

## 2017-03-26 NOTE — Patient Instructions (Addendum)
Side-Stepping    Walk to left side with eyes open. Take even steps, leading with same foot. Make sure each foot lifts off the floor. Repeat in opposite direction. Repeat for _3-5___ lengths of the counter per session. Do _1-2__ sessions per day.   Copyright  VHI. All rights reserved.  Turning in Place: Solid Surface    Standing in place, lead with head and turn slowly making quarter turns toward left.  The direction you are turning to indicates the foot that leads (if you are turning to the right, your RIGHT foot always leads.  If you are turning to the left, your LEFT foot always leads).  This is to avoid crossing your feet. Repeat __3__ times per session clockwise and counterclockwise. Do __1-2__ sessions per day.   Copyright  VHI. All rights reserved.  Backward    Walk backwards with eyes open. Take DELIBERATE, EVEN, SLOW, CONTROLLED steps, making sure each foot lifts off floor.  Then shift and go forward for the length of your counter.  Repeat for __3_ lengths of your counter per session. Do __1-2__ sessions per day.  Count your steps out loud and have your wife next to you for support.   Copyright  VHI. All rights reserved.

## 2017-03-26 NOTE — Therapy (Signed)
[facility] [address] [place], [place], [address] Phone: [phone]   Fax:  [phone]  Physical Therapy Treatment  Patient Details  Name: Adam Cordova MRN: [id number] Date of Birth: Aug 13, 1956 Referring Provider: [name], PA  Encounter Date: 03/26/2017      PT End of Session - 03/26/17 1806    Visit Number 13   Number of Visits 17   Date for PT Re-Evaluation 04/27/17   Authorization Type [employer] every 10th visit   PT Start Time 0934   PT Stop Time 1015   PT Time Calculation (min) 41 min   Equipment Utilized During Treatment Gait belt   Activity Tolerance Patient tolerated treatment well   Behavior During Therapy [facility] for tasks assessed/performed      Past Medical History:  Diagnosis Date  . BPH (benign prostatic hypertrophy) with urinary retention   . Foley catheter in place   . Parkinson disease [facility])    neurologist--  dr Richardean Chimera  in [place]  . UTI (urinary tract infection)     Past Surgical History:  Procedure Laterality Date  . TRANSURETHRAL RESECTION OF PROSTATE N/A 11/23/2012   Procedure: TRANSURETHRAL RESECTION OF THE PROSTATE WITH GYRUS INSTRUMENTS;  Surgeon: Bernestine Amass, MD;  Location: [facility];  Service: Urology;  Laterality: N/A;    There were no vitals filed for this visit.      Subjective Assessment - 03/26/17 0937    Subjective Had a fall, going to turn off lamp before bed.  My feet just got tangled and I went to the floor.  Denies loss of consciousness.  Waiting for aircast to be delivered to use for L foot.  Nothing specific to report from orthopedist visit last week.   Pertinent History Parkinson's disease; Update from neurologist visit in [place] 02/19/17-possible [place] Body disease or [place]   Patient Stated Goals Pt's goals for therapy are to "dramatically improve my sense of balance."   Currently in Pain? Yes   Pain Score 3    Pain Location  Foot   Pain Orientation Left;Lateral   Pain Descriptors / Indicators Burning   Pain Type Chronic pain   Pain Onset More than a month ago   Pain Frequency Intermittent   Aggravating Factors  walking too far   Pain Relieving Factors staying off of feet                         [facility] Adult PT Treatment/Exercise - 03/26/17 0001      Ambulation/Gait   Ambulation/Gait Yes   Ambulation/Gait Assistance 4: Min guard;5: Supervision   Ambulation/Gait Assistance Details Used air cast entire session with gait.  No evidence of L foot rolling during gait.   Ambulation Distance (Feet) 400 Feet  x 2, then 100 ft x 4 reps   Assistive device None   Gait Pattern Step-through pattern;Decreased arm swing - right;Decreased arm swing - left;Decreased dorsiflexion - right;Decreased dorsiflexion - left;Decreased trunk rotation;Narrow base of support;Festinating  near posterior LOB times 3 during session-cues to correct   Ambulation Surface Level;Indoor   Pre-Gait Activities Gait activities including start/stops, retro walking, quick change of directions and negotiating narrow spaces.  Pt has several episodes of posterior LOB upon stopping quickly.  With quick verbal cues, pt is able to regain upright posture to regain balance.   Gait Comments Cues provided with gait for widened BOS and upright posture  High Level Balance   High Level Balance Activities Backward walking;Side stepping  Additional reps of sidestepping/retro walking   High Level Balance Comments Additional reps of sidestepping and retro walking provided with PT and then patient counting for slowed pace             Balance Exercises - 03/26/17 1759      Balance Exercises: Standing   Retro Gait Upper extremity support;Head turns;5 reps  At counter-cues for slowed pace, counting steps out loud   Sidestepping Upper extremity support;5 reps  R and L at counter-cues for slowed pace   Turning Right;Left;5 reps  Quarter  turns-side step-together-for widened BOS with turns   Other Standing Exercises On blue mat surface-varied direction stepping forward/back/side with cues for quick change of directions.  PT provides min guard/min assistance.  Cues throughout session provided for upright posture and use of visual target during gait and standing activities.           PT Education - 03/26/17 1806    Education provided Yes   Education Details [employer] to [employer]) Educated Patient  Spouse at end of session   Methods Explanation;Demonstration;Handout   Comprehension Verbalized understanding;Returned demonstration;Verbal cues required;Need further instruction          PT Short Term Goals - 02/07/17 1020      PT SHORT TERM GOAL #1   Title Pt will perform HEP for balance, strength/stretching, gait with wife's supervision.  TARGET 01/24/17   Time 4   Period Weeks   Status On-going     PT SHORT TERM GOAL #2   Title Pt will perform at least 8 of 10 reps of sit<>stand transfers, from 18" surfaces or lower, with no UE support, no posterior LOB, for improved efficiency and safety with transfers.   Baseline with supervision and cues 02/07/17   Time 4   Period Weeks   Status Partially Met     PT SHORT TERM GOAL #3   Title Pt will improve MiniBESTest score to at least 18/28 for decreased fall risk.   Time 4   Period Weeks   Status Achieved     PT SHORT TERM GOAL #4   Title Pt will verbalize understanding of tips to reduce freezing and festination of gait and turns.   Baseline Wife present for this education x 2 as well.   Time 4   Period Weeks   Status Achieved           PT Long Term Goals - 02/26/17 0936      PT LONG TERM GOAL #1   Title Pt will improve MiniBESTest score to at least 20/28 for decreased fall risk.  Updated TARGET 03/27/17   Baseline MiniBESTest score 18/28 on 02/26/17   Time 4   Period Weeks   Status New     PT LONG TERM GOAL #2   Title Pt will demonstrate floor>stand  transfer with UE support, modified independently, for improved safety with fall recovery.  TARGET 03/27/17   Baseline Needs min guard assistance, unsteady upon standing   Time 4   Period Weeks   Status New     PT LONG TERM GOAL #3   Title Pt will report decrease in falls in home environment, by at least 50%, for improved balance and safety in home environment.   Baseline --   Time 4   Period Weeks   Status New     PT LONG TERM GOAL #4   Title --  Time --   Period --   Status --     PT LONG TERM GOAL #5   Title --   Baseline --   Time --   Period --   Status --               Plan - 03/26/17 1807    Clinical Impression Statement Addressed gait and dynamic balance, change of directions today and reviewed/practiced turns, in light of pt's recent fall with feet crossing causing imbalance.  Trial aircast continues to appear to improve stability in L ankle and foot.  Will continue to benefit from therapy to address balance and gait.  May ask wife to be present over the next couple of session as we prepare for discharge.   Rehab Potential Good   PT Frequency 2x / week   PT Duration 4 weeks  per recert 6/46/80, plux 1x/ 1wk to cover 02/26/17 visit   PT Treatment/Interventions ADLs/Self Care Home Management;Functional mobility training;Gait training;Therapeutic activities;Therapeutic exercise;Balance training;Neuromuscular re-education;Patient/family education   PT Next Visit Plan Review HEP this visit; begin looking at [place] and discuss d/c next week   Consulted and Agree with Plan of Care Patient      Patient will benefit from skilled therapeutic intervention in order to improve the following deficits and impairments:  Abnormal gait, Decreased balance, Decreased safety awareness, Decreased strength, Difficulty walking, Impaired flexibility, Postural dysfunction  Visit Diagnosis: Other abnormalities of gait and mobility  Abnormal posture  Unsteadiness on  feet     Problem List Patient Active Problem List   Diagnosis Date Noted  . 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 Acute urinary retention 11/23/2012  . BPH (benign prostatic hyperplasia) 11/23/2012    Barry Culverhouse W. 03/26/2017, 6:09 PM  Frazier Butt., PT   [facility] [address] [place], [place], [address] Phone: [phone]   Fax:  [phone]  Name: HAWTHORNE DAY MRN: [id number] Date of Birth: Nov 22, 1955

## 2017-03-28 ENCOUNTER — Ambulatory Visit: Payer: PPO | Admitting: Physical Therapy

## 2017-03-28 DIAGNOSIS — R2681 Unsteadiness on feet: Secondary | ICD-10-CM

## 2017-03-28 DIAGNOSIS — R29818 Other symptoms and signs involving the nervous system: Secondary | ICD-10-CM

## 2017-03-28 NOTE — Therapy (Signed)
[facility] [address] [place], [place], [address] Phone: [phone]   Fax:  [phone]  Physical Therapy Treatment  Patient Details  Name: Adam Cordova MRN: [id number] Date of Birth: [birth year] Referring Provider: [name], PA  Encounter Date: 03/28/2017      PT End of Session - 03/28/17 2024    Visit Number 14   Number of Visits 17   Date for PT Re-Evaluation 04/27/17   Authorization Type [employer] every 10th visit   PT Start Time 0847   PT Stop Time 0934   PT Time Calculation (min) 47 min   Activity Tolerance Patient tolerated treatment well   Behavior During Therapy [facility] for tasks assessed/performed      Past Medical History:  Diagnosis Date  . BPH (benign prostatic hypertrophy) with urinary retention   . Foley catheter in place   . Parkinson disease [facility])    neurologist--  dr Richardean Chimera  in [place]  . UTI (urinary tract infection)     Past Surgical History:  Procedure Laterality Date  . TRANSURETHRAL RESECTION OF PROSTATE N/A 11/23/2012   Procedure: TRANSURETHRAL RESECTION OF THE PROSTATE WITH GYRUS INSTRUMENTS;  Surgeon: Bernestine Amass, MD;  Location: [facility];  Service: Urology;  Laterality: N/A;    There were no vitals filed for this visit.      Subjective Assessment - 03/28/17 0849    Subjective Had a fall yesterday, at the end of the boxing class, just standing there, and I fell backwards.  Did not hurt myself.  Got the ankle brace (aircast) yesterday.   Pertinent History Parkinson's disease; Update from neurologist visit in [place] 02/19/17-possible [place] Body disease or [place]   Patient Stated Goals Pt's goals for therapy are to "dramatically improve my sense of balance."   Pain Onset --                         [facility] Adult PT Treatment/Exercise - 03/28/17 0001      Transfers   Transfer Cueing Cues provided for wide/staggered BOS upon  standing to avoid posterior LOB.   Comments Floor>stand transfers, 3 reps no UE support, on blue mat surface.  Pt able to perform modified independently.     High Level Balance   High Level Balance Comments Review of HEP given last visit:  sidestepping, backward walking, turning.  Pt needs cues for slowed pace and technique of exercises.  Pt has near LOB with turning activity-shuffling/freezing     Self-Care   Self-Care Other Self-Care Comments   Other Self-Care Comments  Discussed with patient, then with wife present later in session about recurring falls.  Reviewed pt's full HEP in context of functional activities, and discussed wife's role in cueing patient in functional activities to carryover movement patterns from therapy to home.  Discussed need for external cueing (wife reports she already provides at times) and possibility of use of assistive device such as U-step RW.  Pt feels he is doing better than when starting therapy and he does not want to pursue at this time.     Assessed pt's new Aircast brace (wearing for the first time today); pt appears to have it on correctly and fitting appropriately.  Educated patient and wife to check skin every several hours for the first few days to check for reddened/pressure areas.  Pt/wife verbalize understanding.           PT Education -  03/28/17 2024    Education provided Yes   Education Details Connections of HEP to daily activities; likely discharge from PT next week   Person(s) Educated Patient;Spouse  spouse at end of session   Methods Explanation;Demonstration;Tactile cues;Verbal cues;Handout   Comprehension Verbalized understanding;Returned demonstration;Verbal cues required;Need further instruction          PT Short Term Goals - 02/07/17 1020      PT SHORT TERM GOAL #1   Title Pt will perform HEP for balance, strength/stretching, gait with wife's supervision.  TARGET 01/24/17   Time 4   Period Weeks   Status On-going      PT SHORT TERM GOAL #2   Title Pt will perform at least 8 of 10 reps of sit<>stand transfers, from 18" surfaces or lower, with no UE support, no posterior LOB, for improved efficiency and safety with transfers.   Baseline with supervision and cues 02/07/17   Time 4   Period Weeks   Status Partially Met     PT SHORT TERM GOAL #3   Title Pt will improve MiniBESTest score to at least 18/28 for decreased fall risk.   Time 4   Period Weeks   Status Achieved     PT SHORT TERM GOAL #4   Title Pt will verbalize understanding of tips to reduce freezing and festination of gait and turns.   Baseline Wife present for this education x 2 as well.   Time 4   Period Weeks   Status Achieved           PT Long Term Goals - 02/26/17 0936      PT LONG TERM GOAL #1   Title Pt will improve MiniBESTest score to at least 20/28 for decreased fall risk.  Updated TARGET 03/27/17   Baseline MiniBESTest score 18/28 on 02/26/17   Time 4   Period Weeks   Status New     PT LONG TERM GOAL #2   Title Pt will demonstrate floor>stand transfer with UE support, modified independently, for improved safety with fall recovery.  TARGET 03/27/17   Baseline Needs min guard assistance, unsteady upon standing   Time 4   Period Weeks   Status New     PT LONG TERM GOAL #3   Title Pt will report decrease in falls in home environment, by at least 50%, for improved balance and safety in home environment.   Baseline --   Time 4   Period Weeks   Status New     PT LONG TERM GOAL #4   Title --   Time --   Period --   Status --     PT LONG TERM GOAL #5   Title --   Baseline --   Time --   Period --   Status --               Plan - 03/28/17 2025    Clinical Impression Statement Reviewed HEP at length this visit and discussed with patient and wife importance of carryover of HEP into daily activities and movement patterns.  Discussed limited ability of therapy to make improvements, based on pt's continued falls  and plans for discharge this week.  Discussed external cueing and ultimately assistive device, as ways to minimize falls.   Rehab Potential Good   PT Frequency 2x / week   PT Duration 4 weeks  per recert 7/85/88, plux 1x/ 1wk to cover 02/26/17 visit   PT Treatment/Interventions ADLs/Self Care Home Management;Functional  mobility training;Gait training;Therapeutic activities;Therapeutic exercise;Balance training;Neuromuscular re-education;Patient/family education   PT Next Visit Plan Pt has just purchased cane-try gait training with cane; look at goals and plan for discharge   Consulted and Agree with Plan of Care Patient      Patient will benefit from skilled therapeutic intervention in order to improve the following deficits and impairments:  Abnormal gait, Decreased balance, Decreased safety awareness, Decreased strength, Difficulty walking, Impaired flexibility, Postural dysfunction  Visit Diagnosis: Unsteadiness on feet  Other symptoms and signs involving the nervous system     Problem List Patient Active Problem List   Diagnosis Date Noted  . 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 Acute urinary retention 11/23/2012  . BPH (benign prostatic hyperplasia) 11/23/2012    Shareece Bultman W. 03/28/2017, 8:29 PM  Frazier Butt., PT   [facility] [address] [place], [place], [address] Phone: [phone]   Fax:  [phone]  Name: Adam Cordova MRN: [id number] Date of Birth: 11-09-1955

## 2017-04-02 ENCOUNTER — Ambulatory Visit: Payer: PPO | Attending: Neurology | Admitting: Physical Therapy

## 2017-04-02 DIAGNOSIS — R293 Abnormal posture: Secondary | ICD-10-CM

## 2017-04-02 DIAGNOSIS — R29818 Other symptoms and signs involving the nervous system: Secondary | ICD-10-CM | POA: Diagnosis not present

## 2017-04-02 DIAGNOSIS — R2681 Unsteadiness on feet: Secondary | ICD-10-CM

## 2017-04-02 DIAGNOSIS — R2689 Other abnormalities of gait and mobility: Secondary | ICD-10-CM | POA: Diagnosis not present

## 2017-04-02 NOTE — Therapy (Signed)
[facility] [address] [place], [place], [address] Phone: [phone]   Fax:  [phone]  Physical Therapy Treatment  Patient Details  Name: Adam Cordova MRN: [id number] Date of Birth: 1956/06/02 Referring Provider: [name], PA  Encounter Date: 04/02/2017      PT End of Session - 04/02/17 1056    Visit Number 15   Number of Visits 17   Date for PT Re-Evaluation 04/27/17   Authorization Type [employer] every 10th visit   PT Start Time 0847   PT Stop Time 0922   PT Time Calculation (min) 35 min   Activity Tolerance Patient tolerated treatment well   Behavior During Therapy [facility] for tasks assessed/performed      Past Medical History:  Diagnosis Date  . BPH (benign prostatic hypertrophy) with urinary retention   . Foley catheter in place   . Parkinson disease [facility])    neurologist--  dr Richardean Chimera  in [place]  . UTI (urinary tract infection)     Past Surgical History:  Procedure Laterality Date  . TRANSURETHRAL RESECTION OF PROSTATE N/A 11/23/2012   Procedure: TRANSURETHRAL RESECTION OF THE PROSTATE WITH GYRUS INSTRUMENTS;  Surgeon: Bernestine Amass, MD;  Location: [facility];  Service: Urology;  Laterality: N/A;    There were no vitals filed for this visit.      Subjective Assessment - 04/02/17 0849    Subjective Had one fall since last week, with my feet getting tangled.  Haven't really worn the aircast much since last visit.  Have a cane, but haven't used.  Feel a little sleepy today.   Pertinent History Parkinson's disease; Update from neurologist visit in [place] 02/19/17-possible [place] Body disease or [place]   Patient Stated Goals Pt's goals for therapy are to "dramatically improve my sense of balance."   Currently in Pain? No/denies                         [facility] Adult PT Treatment/Exercise - 04/02/17 0001      Transfers   Comments Floor>stand transfers, 2  reps no UE support, on blue mat surface.  Pt able to perform modified independently.     Ambulation/Gait   Ambulation/Gait Yes   Ambulation/Gait Assistance 4: Min guard;4: Min assist  with trial of gait training with cane   Ambulation/Gait Assistance Details Pt not wearing aircast today.  Gait training today with tripod base cane, as pt reports having at home.  PT needs to provide hand over hand assistance for cane placement first 100 ft.   Ambulation Distance (Feet) 230 Feet  then 115   Assistive device Straight cane;None  cane with tripod base   Gait Pattern Step-through pattern;Decreased arm swing - right;Decreased arm swing - left;Decreased dorsiflexion - right;Decreased dorsiflexion - left;Decreased trunk rotation;Narrow base of support;Festinating  narrow base of support and feet near crossing   Ambulation Surface Level;Indoor   Pre-Gait Activities Pt has tendency to place cane too narrow and has difficulty with R foot clearance.  Pt needs constant cueing for cane placement and sequence and to slow pace of gait.  PT offers to trial cane again at end of session and pt declines.     High Level Balance   High Level Balance Comments MiniBESTest score:  18/28-see full scoring on note section of treatment.     Self-Care   Self-Care Other Self-Care Comments   Other Self-Care Comments  Discussed plans for  d/c today, and recommended at least, return PT, OT, speech therapy screens in 6-9 months.  Pt leaves without scheduling screens.      Mini-BESTest: Balance Evaluation Systems Test  2005-2013 [employer]. All rights reserved. ________________________________________________________________________________________Anticipatory_________Subscore___4__/6 1. SIT TO STAND Instruction: "Cross your arms across your chest. Try not to use your hands unless you must.Do not let your legs lean against the back of the chair when you stand. Please stand up now." X(2) Normal: Comes  to stand without use of hands and stabilizes independently. (1) Moderate: Comes to stand WITH use of hands on first attempt. (0) Severe: Unable to stand up from chair without assistance, OR needs several attempts with use of hands. 2. RISE TO TOES Instruction: "Place your feet shoulder width apart. Place your hands on your hips. Try to rise as high as you can onto your toes. I will count out loud to 3 seconds. Try to hold this pose for at least 3 seconds. Look straight ahead. Rise now." (2) Normal: Stable for 3 s with maximum height. X(1) Moderate: Heels up, but not full range (smaller than when holding hands), OR noticeable instability for 3 s. (0) Severe: < 3 s. 3. STAND ON ONE LEG Instruction: "Look straight ahead. Keep your hands on your hips. Lift your leg off of the ground behind you without touching or resting your raised leg upon your other standing leg. Stay standing on one leg as long as you can. Look straight ahead. Lift now." Left: Time in Seconds Trial 1:__4.19__Trial 2:__0.97___ (2) Normal: 20 s. X(1) Moderate: < 20 s. (0) Severe: Unable. Right: Time in Seconds Trial 1:__3.5___Trial 2:__1.82___ (2) Normal: 20 s. X(1) Moderate: < 20 s. (0) Severe: Unable To score each side separately use the trial with the longest time. To calculate the sub-score and total score use the side [left or right] with the lowest numerical score [i.e. the worse side]. ______________________________________________________________________________________Reactive Postural Control___________Subscore:___2__/6 4. COMPENSATORY STEPPING CORRECTION- FORWARD Instruction: "Stand with your feet shoulder width apart, arms at your sides. Lean forward against my hands beyond your forward limits. When I let go, do whatever is necessary, including taking a step, to avoid a fall." (2) Normal: Recovers independently with a single, large step (second realignment step is allowed). (1) Moderate: More than one step used  to recover equilibrium. X(0) Severe: No step, OR would fall if not caught, OR falls spontaneously. 5. COMPENSATORY STEPPING CORRECTION- BACKWARD Instruction: "Stand with your feet shoulder width apart, arms at your sides. Lean backward against my hands beyond your backward limits. When I let go, do whatever is necessary, including taking a step, to avoid a fall." (2) Normal: Recovers independently with a single, large step. (1) Moderate: More than one step used to recover equilibrium. X(0) Severe: No step, OR would fall if not caught, OR falls spontaneously. 6. COMPENSATORY STEPPING CORRECTION- LATERAL Instruction: "Stand with your feet together, arms down at your sides. Lean into my hand beyond your sideways limit. When I let go, do whatever is necessary, including taking a step, to avoid a fall." Left X(2) Normal: Recovers independently with 1 step (crossover or lateral OK). (1) Moderate: Several steps to recover equilibrium. (0) Severe: Falls, or cannot step. Right X(2) Normal: Recovers independently with 1 step (crossover or lateral OK). (1) Moderate: Several steps to recover equilibrium. (0) Severe: Falls, or cannot step. Use the side with the lowest score to calculate sub-score and total score. ____________________________________________________________________________________Sensory Orientation_____________Subscore:____5_____/6 7. STANCE (FEET TOGETHER); EYES OPEN, FIRM SURFACE  Instruction: "Place your hands on your hips. Place your feet together until almost touching. Look straight ahead. Be as stable and still as possible, until I say stop." Time in seconds:________ X(2) Normal: 30 s. (1) Moderate: < 30 s. (0) Severe: Unable. 8. STANCE (FEET TOGETHER); EYES CLOSED, FOAM SURFACE Instruction: "Step onto the foam. Place your hands on your hips. Place your feet together until almost touching. Be as stable and still as possible, until I say stop. I will start timing when you  close your eyes." Time in seconds:________ X(2) Normal: 30 s. (1) Moderate: < 30 s. (0) Severe: Unable. 9. INCLINE- EYES CLOSED Instruction: "Step onto the incline ramp. Please stand on the incline ramp with your toes toward the top. Place your feet shoulder width apart and have your arms down at your sides. I will start timing when you close your eyes." Time in seconds:________ (2) Normal: Stands independently 30 s and aligns with gravity. X(1) Moderate: Stands independently <30 s OR aligns with surface. (0) Severe: Unable. _________________________________________________________________________________________Dynamic Gait ______Subscore_____7___/10 10. CHANGE IN GAIT SPEED Instruction: "Begin walking at your normal speed, when I tell you 'fast', walk as fast as you can. When I say 'slow', walk very slowly." X(2) Normal: Significantly changes walking speed without imbalance. (1) Moderate: Unable to change walking speed or signs of imbalance. (0) Severe: Unable to achieve significant change in walking speed AND signs of imbalance. [place] - HORIZONTAL Instruction: "Begin walking at your normal speed, when I say "right", turn your head and look to the right. When I say "left" turn your head and look to the left. Try to keep yourself walking in a straight line." (2) Normal: performs head turns with no change in gait speed and good balance. X(1) Moderate: performs head turns with reduction in gait speed. (0) Severe: performs head turns with imbalance. 12. WALK WITH PIVOT TURNS Instruction: "Begin walking at your normal speed. When I tell you to 'turn and stop', turn as quickly as you can, face the opposite direction, and stop. After the turn, your feet should be close together." (2) Normal: Turns with feet close FAST (< 3 steps) with good balance. X(1) Moderate: Turns with feet close SLOW (>4 steps) with good balance. (0) Severe: Cannot turn with feet close at any speed  without imbalance. 13. STEP OVER OBSTACLES Instruction: "Begin walking at your normal speed. When you get to the box, step over it, not around it and keep walking." X(2) Normal: Able to step over box with minimal change of gait speed and with good balance. (1) Moderate: Steps over box but touches box OR displays cautious behavior by slowing gait. (0) Severe: Unable to step over box OR steps around box. 14. TIMED UP & GO WITH DUAL TASK [3 METER WALK] Instruction TUG: "When I say 'Go', stand up from chair, walk at your normal speed across the tape on the floor, turn around, and come back to sit in the chair." Instruction TUG with Dual Task: "Count backwards by threes starting at ___. When I say 'Go', stand up from chair, walk at your normal speed across the tape on the floor, turn around, and come back to sit in the chair. Continue counting backwards the entire time." TUG: ___10.09_____seconds; Dual Task TUG: ____7.97___seconds X(2) Normal: No noticeable change in sitting, standing or walking while backward counting when compared to TUG without Dual Task. (1) Moderate: Dual Task affects either counting OR walking (>10%) when compared to the TUG without Dual  Task. (0) Severe: Stops counting while walking OR stops walking while counting. When scoring item 14, if subject's gait speed slows more than 10% between the TUG without and with a Dual Task the score should be decreased by a point. TOTAL SCORE: ______18__/28             PT Short Term Goals - 02/07/17 1020      PT SHORT TERM GOAL #1   Title Pt will perform HEP for balance, strength/stretching, gait with wife's supervision.  TARGET 01/24/17   Time 4   Period Weeks   Status On-going     PT SHORT TERM GOAL #2   Title Pt will perform at least 8 of 10 reps of sit<>stand transfers, from 18" surfaces or lower, with no UE support, no posterior LOB, for improved efficiency and safety with transfers.   Baseline with supervision and cues  02/07/17   Time 4   Period Weeks   Status Partially Met     PT SHORT TERM GOAL #3   Title Pt will improve MiniBESTest score to at least 18/28 for decreased fall risk.   Time 4   Period Weeks   Status Achieved     PT SHORT TERM GOAL #4   Title Pt will verbalize understanding of tips to reduce freezing and festination of gait and turns.   Baseline Wife present for this education x 2 as well.   Time 4   Period Weeks   Status Achieved           PT Long Term Goals - 04/02/17 2376      PT LONG TERM GOAL #1   Title Pt will improve MiniBESTest score to at least 20/28 for decreased fall risk.  Updated TARGET 03/27/17   Baseline MiniBESTest score 18/28 on 02/26/17, 18/28 on 04/02/17   Time 4   Period Weeks   Status Not Met     PT LONG TERM GOAL #2   Title Pt will demonstrate floor>stand transfer with UE support, modified independently, for improved safety with fall recovery.  TARGET 03/27/17   Baseline Needs min guard assistance, unsteady upon standing   Time 4   Period Weeks   Status Achieved     PT LONG TERM GOAL #3   Title Pt will report decrease in falls in home environment, by at least 50%, for improved balance and safety in home environment.   Baseline Pt continues to fall 1-2 times per week.     Time 4   Period Weeks   Status Not Met               Plan - 04/02/17 1057    Clinical Impression Statement Pt has not met LTG 1 and 3.  LTG 2 met for floor>stand transfer.  Pt continues to have falls at home, and continues to have no balance reactions in posterior and anterior directions of push/release test.  Pt is appropriate to be discharged this visit, as he has been instructed in HEP and fall prevention.  PT has attempted to introduce appropriate assistive devices, but pt has declined.   Rehab Potential Good   PT Frequency 2x / week   PT Duration 4 weeks  per recert 2/83/15, plux 1x/ 1wk to cover 02/26/17 visit   PT Treatment/Interventions ADLs/Self Care Home  Management;Functional mobility training;Gait training;Therapeutic activities;Therapeutic exercise;Balance training;Neuromuscular re-education;Patient/family education   PT Next Visit Plan Discharge this visit, recommend return PT, OT, speech screen in 6 -9 months.   Consulted  and Agree with Plan of Care Patient      Patient will benefit from skilled therapeutic intervention in order to improve the following deficits and impairments:  Abnormal gait, Decreased balance, Decreased safety awareness, Decreased strength, Difficulty walking, Impaired flexibility, Postural dysfunction  Visit Diagnosis: Other abnormalities of gait and mobility  Other symptoms and signs involving the nervous system  Unsteadiness on feet  Abnormal posture       G-Codes - 2017-05-01 1100    Functional Assessment Tool Used (Outpatient Only) MiniBESTest 18/28, 1 fall int he past week   Functional Limitation Mobility: Walking and moving around   Mobility: Walking and Moving Around Goal Status [phone]) At least 20 percent but less than 40 percent impaired, limited or restricted   Mobility: Walking and Moving Around Discharge Status [phone]) At least 20 percent but less than 40 percent impaired, limited or restricted      Problem List Patient Active Problem List   Diagnosis Date Noted  . 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 Acute urinary retention 11/23/2012  . BPH (benign prostatic hyperplasia) 11/23/2012    Estee Yohe W. 2017-05-01, 12:30 PM  Frazier Butt., PT   [facility] [address] [place], [place], [address] Phone: [phone]   Fax:  [phone]  Name: SUHAIL PELOQUIN MRN: [id number] Date of Birth: Jan 19, 1956   PHYSICAL THERAPY DISCHARGE SUMMARY  Visits from Start of Care: 15  Current functional level related to goals / functional outcomes: See goals asssessed above   Remaining deficits: Balance, decreased safety awareness, decreased  balance reactions; continued falls   Education / Equipment: Educated in fall prevention, HEP, tips to reduce freezing, benefits of U-step RW for device(pt unwilling to try)  Plan: Patient agrees to discharge.  Patient goals were partially met. Patient is being discharged due to lack of progress.  ?????Recommend PT screen in 6-9 months.       Mady Haagensen, PT 05/01/17 12:35 PM Phone: [phone] Fax: [phone]

## 2017-04-04 ENCOUNTER — Ambulatory Visit: Payer: PPO | Admitting: Physical Therapy
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
## 2017-04-22 ENCOUNTER — Ambulatory Visit (INDEPENDENT_AMBULATORY_CARE_PROVIDER_SITE_OTHER): Payer: PPO | Admitting: Neurology

## 2017-04-22 ENCOUNTER — Encounter: Payer: Self-pay | Admitting: Neurology

## 2017-04-22 VITALS — BP 130/90 | HR 74 | Ht 69.0 in | Wt 165.0 lb

## 2017-04-22 DIAGNOSIS — G20 Parkinson's disease: Secondary | ICD-10-CM

## 2017-04-22 DIAGNOSIS — R351 Nocturia: Secondary | ICD-10-CM | POA: Diagnosis not present

## 2017-04-22 DIAGNOSIS — G4719 Other hypersomnia: Secondary | ICD-10-CM

## 2017-04-22 DIAGNOSIS — F0280 Dementia in other diseases classified elsewhere without behavioral disturbance: Secondary | ICD-10-CM

## 2017-04-22 MED ORDER — CARBIDOPA-LEVODOPA 25-100 MG PO TABS: ORAL_TABLET | ORAL | 1 refills | Status: DC

## 2017-04-22 NOTE — Patient Instructions (Addendum)
1. Increase carbidopa/levodopa 25/100, from 2.5 tablets tablets at 7am/noon/5-6pm to 2.5 tablets at 7am/2 tablets at 10am/2.5 tablets at 2pm/2.5 tablets at 6 pm   2. Patients age 63+:  You have been referred for a neurocognitive evaluation in our office.   The evaluation takes approximately two hours. The first part of the appointment is a clinical interview with the neuropsychologist (Dr. Macarthur Critchley). Please bring someone with you to this appointment if possible, as it is helpful for Dr. [name] to hear from both you and another adult who knows you well. After speaking with Dr. [name], you will complete testing with her technician. The testing includes a variety of tasks- mostly question-and-answer, some paper-and-pencil. There is nothing you need to do to prepare for this appointment, but having a good night's sleep prior to the testing, and bringing eyeglasses and hearing aids (if you wear them), is advised.   About a week after the evaluation, you will return to follow up with Dr. [name] to review the test results. This appointment is about 30 minutes. If you would like a family member to receive this information as well, please bring them to the appointment.   We have to reserve several hours of the neuropsychologist's time and the psychometrician's time for your evaluation appointment. As such, please note that there is a No-Show fee of $100. If you are unable to attend any of your appointments, please contact our office as soon as possible to reschedule.    Patients age 65 and under:  You have been referred for a neurocognitive evaluation in our office.   The evaluation consists of three appointments.   1. The first appointment is about 45 minutes and is a clinical interview with the neuropsychologist (Dr. Macarthur Critchley). You can bring someone with you to this appointment, as it is helpful for Dr. [name] to hear from both you and another adult who knows you well.   2. The second  appointment is 2-3 hours long and is with the psychometrician Milana Kidney). You will complete a variety of tasks- mostly question-and-answer, some paper-and-pencil, some on the computer. There is nothing you need to do to prepare for this appointment, but having a good night's sleep prior to the testing, and bringing eyeglasses and hearing aids (if you wear them), is advised.   3. The final appointment is a follow-up with Dr. [name] where she will go over the test results with you and provide recommendations. This appointment is about 30 minutes.  If you would like a family member to receive this information as well, please bring them to the appointment.   We have to reserve several hours of the neuropsychologist's time and the psychometrician's time for your appointment. As such, please note that there is a No-Show fee of $100. If you are unable to attend any of your appointments, please contact our office as soon as possible to reschedule.   3. We have ordered a sleep study. These are performed at [facility]. They should call you to schedule this study. If you don't hear from them please contact them at [phone].

## 2017-04-28 ENCOUNTER — Encounter: Payer: Self-pay | Admitting: Neurology

## 2017-04-28 DIAGNOSIS — R972 Elevated prostate specific antigen [PSA]: Secondary | ICD-10-CM | POA: Diagnosis not present

## 2017-04-28 DIAGNOSIS — G20 Parkinson's disease: Secondary | ICD-10-CM

## 2017-04-28 DIAGNOSIS — R413 Other amnesia: Secondary | ICD-10-CM

## 2017-04-30 DIAGNOSIS — Z23 Encounter for immunization: Secondary | ICD-10-CM | POA: Diagnosis not present

## 2017-05-01 DIAGNOSIS — N323 Diverticulum of bladder: Secondary | ICD-10-CM | POA: Diagnosis not present

## 2017-05-01 DIAGNOSIS — R351 Nocturia: Secondary | ICD-10-CM | POA: Diagnosis not present

## 2017-05-01 DIAGNOSIS — N401 Enlarged prostate with lower urinary tract symptoms: Secondary | ICD-10-CM | POA: Diagnosis not present

## 2017-05-01 DIAGNOSIS — R972 Elevated prostate specific antigen [PSA]: Secondary | ICD-10-CM | POA: Diagnosis not present

## 2017-05-14 ENCOUNTER — Telehealth: Payer: Self-pay | Admitting: *Deleted

## 2017-05-14 ENCOUNTER — Ambulatory Visit
Admission: RE | Admit: 2017-05-14 | Discharge: 2017-05-14 | Disposition: A | Discharge status: Home / Self Care | Payer: PPO | Source: Ambulatory Visit | Attending: Neurology | Admitting: Neurology

## 2017-05-14 DIAGNOSIS — R413 Other amnesia: Secondary | ICD-10-CM

## 2017-05-14 DIAGNOSIS — G20 Parkinson's disease: Secondary | ICD-10-CM | POA: Diagnosis not present

## 2017-05-14 NOTE — Telephone Encounter (Signed)
Attempted to contact patient.  No answer.  Phone just stopped ringing.  Will try again tomorrow.

## 2017-05-14 NOTE — Telephone Encounter (Signed)
-----   Message from [place], DO sent at 05/14/2017  3:12 PM EDT ----- Reviewed.  Agree.  Mod small vessel ds.  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

## 2017-05-19 ENCOUNTER — Telehealth: Payer: Self-pay | Admitting: *Deleted

## 2017-05-19 ENCOUNTER — Encounter: Payer: Self-pay | Admitting: *Deleted

## 2017-05-19 NOTE — Telephone Encounter (Signed)
Results sent via My Chart.  

## 2017-05-19 NOTE — Telephone Encounter (Signed)
-----   Message from [place], DO sent at 05/14/2017  3:12 PM EDT ----- Reviewed.  Agree.  Mod small vessel ds.  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

## 2017-05-28 ENCOUNTER — Encounter: Payer: Self-pay | Admitting: Neurology

## 2017-05-30 DIAGNOSIS — R972 Elevated prostate specific antigen [PSA]: Secondary | ICD-10-CM | POA: Diagnosis not present

## 2017-05-30 DIAGNOSIS — N401 Enlarged prostate with lower urinary tract symptoms: Secondary | ICD-10-CM | POA: Diagnosis not present

## 2017-05-30 DIAGNOSIS — R3914 Feeling of incomplete bladder emptying: Secondary | ICD-10-CM | POA: Diagnosis not present

## 2017-05-30 DIAGNOSIS — R350 Frequency of micturition: Secondary | ICD-10-CM | POA: Diagnosis not present

## 2017-05-30 DIAGNOSIS — R351 Nocturia: Secondary | ICD-10-CM | POA: Diagnosis not present

## 2017-06-05 ENCOUNTER — Other Ambulatory Visit: Payer: Self-pay | Admitting: Urology

## 2017-07-03 ENCOUNTER — Ambulatory Visit (HOSPITAL_BASED_OUTPATIENT_CLINIC_OR_DEPARTMENT_OTHER): Payer: PPO | Attending: Neurology | Admitting: Internal Medicine

## 2017-07-03 DIAGNOSIS — G4719 Other hypersomnia: Secondary | ICD-10-CM

## 2017-07-03 DIAGNOSIS — G4700 Insomnia, unspecified: Secondary | ICD-10-CM | POA: Insufficient documentation

## 2017-07-03 DIAGNOSIS — G4761 Periodic limb movement disorder: Secondary | ICD-10-CM | POA: Diagnosis not present

## 2017-07-03 DIAGNOSIS — G4710 Hypersomnia, unspecified: Secondary | ICD-10-CM | POA: Diagnosis present

## 2017-07-03 DIAGNOSIS — I493 Ventricular premature depolarization: Secondary | ICD-10-CM | POA: Insufficient documentation

## 2017-07-04 NOTE — Patient Instructions (Addendum)
Adam Cordova  07/04/2017   Your procedure is scheduled on: 07-09-17   Report to [facility] Main  Entrance Take [place]  Elevators to 3rd floor to  [place] at 10:00AM.   Call this number if you have problems the morning of surgery [phone]    Remember: ONLY 1 PERSON MAY GO WITH YOU TO SHORT STAY TO GET  READY MORNING OF [place].  Do not eat food or drink liquids :After Midnight.     Take these medicines the morning of surgery with A SIP OF WATER: Carbidopa-Levodopa (Sinemet IR)                                You may not have any metal on your body including hair pins and              piercings  Do not wear jewelry, lotions, powders or perfumes, deodorant             Men may shave face and neck.   Do not bring valuables to the hospital. [place].  Contacts, dentures or bridgework may not be worn into surgery.     Patients discharged the day of surgery will not be allowed to drive home.  Name and phone number of your driver: Herby Amick [phone]                Please read over the following fact sheets you were given: _____________________________________________________________________             [facility] - Preparing for Surgery Before surgery, you can play an important role.  Because skin is not sterile, your skin needs to be as free of germs as possible.  You can reduce the number of germs on your skin by washing with CHG (chlorahexidine gluconate) soap before surgery.  CHG is an antiseptic cleaner which kills germs and bonds with the skin to continue killing germs even after washing. Please DO NOT use if you have an allergy to CHG or antibacterial soaps.  If your skin becomes reddened/irritated stop using the CHG and inform your nurse when you arrive at Short Stay. Do not shave (including legs and underarms) for at least 48 hours prior to the first CHG shower.  You may shave  your face/neck. Please follow these instructions carefully:  1.  Shower with CHG Soap the night before surgery and the  morning of Surgery.  2.  If you choose to wash your hair, wash your hair first as usual with your  normal  shampoo.  3.  After you shampoo, rinse your hair and body thoroughly to remove the  shampoo.                           4.  Use CHG as you would any other liquid soap.  You can apply chg directly  to the skin and wash                       Gently with a scrungie or clean washcloth.  5.  Apply the CHG Soap to your body ONLY FROM THE NECK DOWN.   Do not use on  face/ open                           Wound or open sores. Avoid contact with eyes, ears mouth and genitals (private parts).                       Wash face,  Genitals (private parts) with your normal soap.             6.  Wash thoroughly, paying special attention to the area where your surgery  will be performed.  7.  Thoroughly rinse your body with warm water from the neck down.  8.  DO NOT shower/wash with your normal soap after using and rinsing off  the CHG Soap.                9.  Pat yourself dry with a clean towel.            10.  Wear clean pajamas.            11.  Place clean sheets on your bed the night of your first shower and do not  sleep with pets. Day of Surgery : Do not apply any lotions/deodorants the morning of surgery.  Please wear clean clothes to the hospital/surgery center.  FAILURE TO FOLLOW THESE INSTRUCTIONS MAY RESULT IN THE CANCELLATION OF YOUR SURGERY PATIENT SIGNATURE_________________________________  NURSE SIGNATURE__________________________________  ________________________________________________________________________

## 2017-07-07 ENCOUNTER — Encounter (HOSPITAL_COMMUNITY)
Admission: RE | Admit: 2017-07-07 | Discharge: 2017-07-07 | Disposition: A | Discharge status: Home / Self Care | Payer: PPO | Source: Ambulatory Visit | Attending: Urology | Admitting: Urology

## 2017-07-07 ENCOUNTER — Encounter (HOSPITAL_COMMUNITY): Payer: Self-pay

## 2017-07-07 ENCOUNTER — Other Ambulatory Visit: Payer: Self-pay

## 2017-07-07 DIAGNOSIS — R350 Frequency of micturition: Secondary | ICD-10-CM | POA: Diagnosis not present

## 2017-07-07 DIAGNOSIS — Z79899 Other long term (current) drug therapy: Secondary | ICD-10-CM | POA: Diagnosis not present

## 2017-07-07 DIAGNOSIS — G20 Parkinson's disease: Secondary | ICD-10-CM | POA: Diagnosis not present

## 2017-07-07 DIAGNOSIS — Z791 Long term (current) use of non-steroidal anti-inflammatories (NSAID): Secondary | ICD-10-CM | POA: Diagnosis not present

## 2017-07-07 DIAGNOSIS — N401 Enlarged prostate with lower urinary tract symptoms: Secondary | ICD-10-CM | POA: Diagnosis not present

## 2017-07-07 DIAGNOSIS — R410 Disorientation, unspecified: Secondary | ICD-10-CM | POA: Diagnosis not present

## 2017-07-07 DIAGNOSIS — R3912 Poor urinary stream: Secondary | ICD-10-CM | POA: Diagnosis not present

## 2017-07-07 DIAGNOSIS — R351 Nocturia: Secondary | ICD-10-CM | POA: Diagnosis not present

## 2017-07-07 LAB — CBC
HCT: 39.9 % (ref 39.0–52.0)
HEMOGLOBIN: 14.3 g/dL (ref 13.0–17.0)
MCH: 31.7 pg (ref 26.0–34.0)
MCHC: 35.8 g/dL (ref 30.0–36.0)
MCV: 88.5 fL (ref 78.0–100.0)
Platelets: 246 10*3/uL (ref 150–400)
RBC: 4.51 MIL/uL (ref 4.22–5.81)
RDW: 14.8 % (ref 11.5–15.5)
WBC: 9.4 10*3/uL (ref 4.0–10.5)

## 2017-07-07 LAB — BASIC METABOLIC PANEL
ANION GAP: 8 (ref 5–15)
BUN: 21 mg/dL — AB (ref 6–20)
CALCIUM: 8.9 mg/dL (ref 8.9–10.3)
CO2: 26 mmol/L (ref 22–32)
Chloride: 105 mmol/L (ref 101–111)
Creatinine, Ser: 1 mg/dL (ref 0.61–1.24)
GFR calc Af Amer: >60 mL/min (ref >60)
Glucose, Bld: 102 mg/dL — ABNORMAL HIGH (ref 65–99)
Potassium: 4.4 mmol/L (ref 3.5–5.1)
SODIUM: 139 mmol/L (ref 135–145)

## 2017-07-07 NOTE — H&P (Signed)
Urology Preoperative H&P   Chief Complaint: BPH with lower urinary tract symptoms  History of Present Illness: Adam Cordova is a 61 y.o. male with who is here for surgical treatment regarding  BPH with lower urinary tract symptoms.  The patient complains of lower urinary tract symptom(s) that include frequency, urgency, weak stream, and nocturia.   Started tamsulosin twice daily at last OV. Still having predominantly nighttime issues. Nocturia 3. He states that he can go 4-6 hours during the day without the urge to urinate. When he does go during the daytime, he has an adequate force of stream and feels like he is completely emptying. He denies interval urinary tract infections, dysuria or hematuria.   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   Hx of limited TURP in 2014 that greatly improved his voiding sxs.    Past Medical History:  Diagnosis Date  . BPH (benign prostatic hypertrophy) with urinary retention   . Foley catheter in place   . Parkinson disease [facility])    neurologist--  dr Adam Cordova  in [place]  . UTI (urinary tract infection)     No past surgical history on file.  Allergies:  Allergies  Allergen Reactions  . Bee Venom Anaphylaxis  . Levaquin [Levofloxacin Hemihydrate] Anaphylaxis and Swelling  . Sertraline Diarrhea and Nausea Only    Family History  Problem Relation Age of Onset  . Dementia Mother   . Heart disease Father   . Heart disease Brother     Social History:  reports that  has never smoked. he has never used smokeless tobacco. He reports that he drinks alcohol. He reports that he does not use drugs.  ROS: A complete review of systems was performed.  All systems are negative except for pertinent findings as noted.  Physical Exam:  Vital signs in last 24 hours: Temp:  [98.2 F (36.8 C)] 98.2 F (36.8 C) (11/05 1015) Pulse Rate:  [74] 74 (11/05 1015) Resp:  [18] 18 (11/05 1015) BP: (127)/(73) 127/73  (11/05 1015) SpO2:  [99 %] 99 % (11/05 1015) Weight:  [73.9 kg (163 lb)] 73.9 kg (163 lb) (11/05 1015) Constitutional:  Alert and oriented, No acute distress Cardiovascular: Regular rate and rhythm, No JVD Respiratory: Normal respiratory effort, Lungs clear bilaterally GI: Abdomen is soft, nontender, nondistended, no abdominal masses GU: No CVA tenderness Lymphatic: No lymphadenopathy Neurologic: Grossly intact, no focal deficits Psychiatric: Normal mood and affect  Laboratory Data:  Recent Labs    07/07/17 1033  WBC 9.4  HGB 14.3  HCT 39.9  PLT 246    Recent Labs    07/07/17 1033  NA 139  K 4.4  CL 105  GLUCOSE 102*  BUN 21*  CALCIUM 8.9  CREATININE 1.00     Results for orders placed or performed during the hospital encounter of 07/07/17 (from the past 24 hour(s))  Basic metabolic panel     Status: Abnormal   Collection Time: 07/07/17 10:33 AM  Result Value Ref Range   Sodium 139 135 - 145 mmol/L   Potassium 4.4 3.5 - 5.1 mmol/L   Chloride 105 101 - 111 mmol/L   CO2 26 22 - 32 mmol/L   Glucose, Bld 102 (H) 65 - 99 mg/dL   BUN 21 (H) 6 - 20 mg/dL   Creatinine, Ser 1.00 0.61 - 1.24 mg/dL   Calcium 8.9 8.9 - 10.3 mg/dL   GFR calc non Af Amer >60 >60 mL/min   GFR calc Af  Amer >60 >60 mL/min   Anion gap 8 5 - 15  CBC     Status: None   Collection Time: 07/07/17 10:33 AM  Result Value Ref Range   WBC 9.4 4.0 - 10.5 K/uL   RBC 4.51 4.22 - 5.81 MIL/uL   Hemoglobin 14.3 13.0 - 17.0 g/dL   HCT 39.9 39.0 - 52.0 %   MCV 88.5 78.0 - 100.0 fL   MCH 31.7 26.0 - 34.0 pg   MCHC 35.8 30.0 - 36.0 g/dL   RDW 14.8 11.5 - 15.5 %   Platelets 246 150 - 400 K/uL   No results found for this or any previous visit (from the past 240 hour(s)).  Renal Function: Recent Labs    07/07/17 1033  CREATININE 1.00   Estimated Creatinine Clearance: 78.1 mL/min (by C-G formula based on SCr of 1 mg/dL).  Radiologic Imaging: No results found.  I independently reviewed the above  imaging studies.  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 He would like to proceed with surgical resection. I discussed bipolar transurethral resection of the prostate. I specifically discussed the risks including but not limited to bleeding which could require blood transfusion, infection, and injury to surrounding structures. Also discussed the possibility that the surgery would not improve symptoms though most men have a great improvement in their symptoms. Also discussed the low likelihood but possibility of development of new symptoms such as irritative voiding symptoms or urinary incontinence. Most men will have some degree of urinary urgency and discomfort immediately following the surgery that resolves in a short amount of time. He understands that most often this is an outpatient procedure but occasionally patients require hospitalization for continuous bladder irrigation in the event of excess bleeding. He also understands the possibility of being sent home with a urethral catheter. The patient expressed understanding and wishes to proceed. Adam Cordova is expecting a grandchild in the next 1-2 weeks. Will proceed with Bipolar TURP.    Adam Hughs, MD 07/07/2017, 1:21 PM  [facility] Pager: [phone]

## 2017-07-09 ENCOUNTER — Ambulatory Visit (HOSPITAL_COMMUNITY): Payer: PPO | Admitting: Anesthesiology

## 2017-07-09 ENCOUNTER — Other Ambulatory Visit: Payer: Self-pay

## 2017-07-09 ENCOUNTER — Encounter (HOSPITAL_COMMUNITY): Payer: Self-pay | Admitting: Emergency Medicine

## 2017-07-09 ENCOUNTER — Encounter (HOSPITAL_COMMUNITY)
Admission: RE | Disposition: A | Discharge status: Home / Self Care | Payer: Self-pay | Source: Ambulatory Visit | Attending: Urology

## 2017-07-09 ENCOUNTER — Observation Stay (HOSPITAL_COMMUNITY)
Admission: RE | Admit: 2017-07-09 | Discharge: 2017-07-10 | Disposition: A | Discharge status: Home / Self Care | Payer: PPO | Source: Ambulatory Visit | Attending: Urology | Admitting: Urology

## 2017-07-09 DIAGNOSIS — R410 Disorientation, unspecified: Secondary | ICD-10-CM | POA: Insufficient documentation

## 2017-07-09 DIAGNOSIS — N401 Enlarged prostate with lower urinary tract symptoms: Principal | ICD-10-CM | POA: Insufficient documentation

## 2017-07-09 DIAGNOSIS — R350 Frequency of micturition: Secondary | ICD-10-CM | POA: Diagnosis not present

## 2017-07-09 DIAGNOSIS — R3912 Poor urinary stream: Secondary | ICD-10-CM | POA: Insufficient documentation

## 2017-07-09 DIAGNOSIS — Z79899 Other long term (current) drug therapy: Secondary | ICD-10-CM | POA: Insufficient documentation

## 2017-07-09 DIAGNOSIS — G20 Parkinson's disease: Secondary | ICD-10-CM | POA: Diagnosis not present

## 2017-07-09 DIAGNOSIS — R3914 Feeling of incomplete bladder emptying: Secondary | ICD-10-CM | POA: Diagnosis not present

## 2017-07-09 DIAGNOSIS — R3915 Urgency of urination: Secondary | ICD-10-CM | POA: Diagnosis not present

## 2017-07-09 DIAGNOSIS — R351 Nocturia: Secondary | ICD-10-CM | POA: Insufficient documentation

## 2017-07-09 DIAGNOSIS — Z791 Long term (current) use of non-steroidal anti-inflammatories (NSAID): Secondary | ICD-10-CM | POA: Insufficient documentation

## 2017-07-09 DIAGNOSIS — R339 Retention of urine, unspecified: Secondary | ICD-10-CM | POA: Diagnosis not present

## 2017-07-09 DIAGNOSIS — N138 Other obstructive and reflux uropathy: Secondary | ICD-10-CM | POA: Diagnosis present

## 2017-07-09 DIAGNOSIS — R338 Other retention of urine: Secondary | ICD-10-CM | POA: Diagnosis not present

## 2017-07-09 HISTORY — PX: TRANSURETHRAL RESECTION OF PROSTATE: SHX73

## 2017-07-09 LAB — BASIC METABOLIC PANEL
Anion gap: 9 (ref 5–15)
BUN: 16 mg/dL (ref 6–20)
CHLORIDE: 107 mmol/L (ref 101–111)
CO2: 24 mmol/L (ref 22–32)
CREATININE: 0.89 mg/dL (ref 0.61–1.24)
Calcium: 8.6 mg/dL — ABNORMAL LOW (ref 8.9–10.3)
GFR calc Af Amer: >60 mL/min (ref >60)
GFR calc non Af Amer: >60 mL/min (ref >60)
Glucose, Bld: 121 mg/dL — ABNORMAL HIGH (ref 65–99)
Potassium: 4 mmol/L (ref 3.5–5.1)
Sodium: 140 mmol/L (ref 135–145)

## 2017-07-09 LAB — HEMOGLOBIN AND HEMATOCRIT, BLOOD
HCT: 39 % (ref 39.0–52.0)
Hemoglobin: 13.6 g/dL (ref 13.0–17.0)

## 2017-07-09 SURGERY — TURP (TRANSURETHRAL RESECTION OF PROSTATE)
Anesthesia: General

## 2017-07-09 MED ORDER — FENTANYL CITRATE (PF) 100 MCG/2ML IJ SOLN
INTRAMUSCULAR | Status: AC
  Filled 2017-07-09: qty 2

## 2017-07-09 MED ORDER — DIPHENHYDRAMINE HCL 50 MG/ML IJ SOLN: 12.5000 mg | Freq: Four times a day (QID) | INTRAMUSCULAR | Status: DC | PRN

## 2017-07-09 MED ORDER — DIPHENHYDRAMINE HCL 12.5 MG/5ML PO ELIX: 12.5000 mg | ORAL_SOLUTION | Freq: Four times a day (QID) | ORAL | Status: DC | PRN

## 2017-07-09 MED ORDER — GLYCOPYRROLATE 0.2 MG/ML IV SOSY
PREFILLED_SYRINGE | INTRAVENOUS | Status: DC | PRN
  Administered 2017-07-09: 0.4 mg via INTRAVENOUS

## 2017-07-09 MED ORDER — KETOROLAC TROMETHAMINE 30 MG/ML IJ SOLN: 30.0000 mg | Freq: Once | INTRAMUSCULAR | Status: DC | PRN

## 2017-07-09 MED ORDER — PROPOFOL 10 MG/ML IV BOLUS
INTRAVENOUS | Status: AC
  Filled 2017-07-09: qty 20

## 2017-07-09 MED ORDER — TAMSULOSIN HCL 0.4 MG PO CAPS
0.4000 mg | ORAL_CAPSULE | Freq: Two times a day (BID) | ORAL | Status: DC
  Administered 2017-07-09 – 2017-07-10 (×2): 0.4 mg via ORAL
  Filled 2017-07-09 (×2): qty 1

## 2017-07-09 MED ORDER — ONDANSETRON HCL 4 MG/2ML IJ SOLN
4.0000 mg | INTRAMUSCULAR | Status: DC | PRN
  Administered 2017-07-10: 4 mg via INTRAVENOUS
  Filled 2017-07-09: qty 2

## 2017-07-09 MED ORDER — CEFAZOLIN SODIUM-DEXTROSE 2-4 GM/100ML-% IV SOLN
2.0000 g | Freq: Once | INTRAVENOUS | Status: AC
  Administered 2017-07-09: 2 g via INTRAVENOUS
  Filled 2017-07-09: qty 100

## 2017-07-09 MED ORDER — CARBIDOPA-LEVODOPA 25-100 MG PO TABS: 2.0000 | ORAL_TABLET | Freq: Four times a day (QID) | ORAL | Status: DC

## 2017-07-09 MED ORDER — ZOLPIDEM TARTRATE 5 MG PO TABS: 5.0000 mg | ORAL_TABLET | Freq: Every evening | ORAL | Status: DC | PRN

## 2017-07-09 MED ORDER — LIDOCAINE 2% (20 MG/ML) 5 ML SYRINGE
INTRAMUSCULAR | Status: DC | PRN
  Administered 2017-07-09: 40 mg via INTRAVENOUS
  Administered 2017-07-09: 60 mg via INTRAVENOUS

## 2017-07-09 MED ORDER — PHENYLEPHRINE 40 MCG/ML (10ML) SYRINGE FOR IV PUSH (FOR BLOOD PRESSURE SUPPORT)
PREFILLED_SYRINGE | INTRAVENOUS | Status: AC
  Filled 2017-07-09: qty 10

## 2017-07-09 MED ORDER — SULFAMETHOXAZOLE-TRIMETHOPRIM 800-160 MG PO TABS: 1.0000 | ORAL_TABLET | Freq: Two times a day (BID) | ORAL | 0 refills | Status: AC

## 2017-07-09 MED ORDER — SODIUM CHLORIDE 0.9 % IR SOLN
3000.0000 mL | Status: DC
  Administered 2017-07-09: 3000 mL

## 2017-07-09 MED ORDER — DONEPEZIL HCL 10 MG PO TABS
10.0000 mg | ORAL_TABLET | Freq: Every day | ORAL | Status: DC
  Administered 2017-07-09: 10 mg via ORAL
  Filled 2017-07-09: qty 1

## 2017-07-09 MED ORDER — PHENAZOPYRIDINE HCL 200 MG PO TABS: 200.0000 mg | ORAL_TABLET | Freq: Three times a day (TID) | ORAL | 0 refills | Status: DC | PRN

## 2017-07-09 MED ORDER — FENTANYL CITRATE (PF) 100 MCG/2ML IJ SOLN
25.0000 ug | INTRAMUSCULAR | Status: DC | PRN
  Administered 2017-07-09 (×2): 50 ug via INTRAVENOUS

## 2017-07-09 MED ORDER — ONDANSETRON HCL 4 MG/2ML IJ SOLN
INTRAMUSCULAR | Status: AC
  Filled 2017-07-09: qty 2

## 2017-07-09 MED ORDER — SODIUM CHLORIDE 0.9 % IV SOLN
INTRAVENOUS | Status: DC
  Administered 2017-07-09 – 2017-07-10 (×2): via INTRAVENOUS

## 2017-07-09 MED ORDER — DEXAMETHASONE SODIUM PHOSPHATE 10 MG/ML IJ SOLN
INTRAMUSCULAR | Status: AC
  Filled 2017-07-09: qty 1

## 2017-07-09 MED ORDER — PROPOFOL 10 MG/ML IV BOLUS
INTRAVENOUS | Status: DC | PRN
  Administered 2017-07-09: 50 mg via INTRAVENOUS
  Administered 2017-07-09: 150 mg via INTRAVENOUS

## 2017-07-09 MED ORDER — MIDAZOLAM HCL 2 MG/2ML IJ SOLN
INTRAMUSCULAR | Status: AC
  Filled 2017-07-09: qty 2

## 2017-07-09 MED ORDER — LACTATED RINGERS IV SOLN
INTRAVENOUS | Status: DC
  Administered 2017-07-09: 10:00:00 via INTRAVENOUS

## 2017-07-09 MED ORDER — FENTANYL CITRATE (PF) 100 MCG/2ML IJ SOLN
INTRAMUSCULAR | Status: DC | PRN
  Administered 2017-07-09: 25 ug via INTRAVENOUS
  Administered 2017-07-09 (×2): 50 ug via INTRAVENOUS
  Administered 2017-07-09: 25 ug via INTRAVENOUS
  Administered 2017-07-09: 50 ug via INTRAVENOUS

## 2017-07-09 MED ORDER — PROMETHAZINE HCL 25 MG/ML IJ SOLN: 6.2500 mg | INTRAMUSCULAR | Status: DC | PRN

## 2017-07-09 MED ORDER — HYDROCODONE-ACETAMINOPHEN 5-325 MG PO TABS: 1.0000 | ORAL_TABLET | ORAL | 0 refills | Status: DC | PRN

## 2017-07-09 MED ORDER — EPHEDRINE SULFATE-NACL 50-0.9 MG/10ML-% IV SOSY
PREFILLED_SYRINGE | INTRAVENOUS | Status: DC | PRN
  Administered 2017-07-09 (×2): 25 mg via INTRAVENOUS

## 2017-07-09 MED ORDER — CARBIDOPA-LEVODOPA 25-100 MG PO TABS
2.0000 | ORAL_TABLET | Freq: Every day | ORAL | Status: DC
  Administered 2017-07-10: 2 via ORAL
  Filled 2017-07-09: qty 2

## 2017-07-09 MED ORDER — CEFAZOLIN SODIUM-DEXTROSE 1-4 GM/50ML-% IV SOLN
1.0000 g | Freq: Three times a day (TID) | INTRAVENOUS | Status: AC
  Administered 2017-07-09 – 2017-07-10 (×2): 1 g via INTRAVENOUS
  Filled 2017-07-09 (×2): qty 50

## 2017-07-09 MED ORDER — ONDANSETRON HCL 4 MG/2ML IJ SOLN
INTRAMUSCULAR | Status: DC | PRN
  Administered 2017-07-09: 4 mg via INTRAVENOUS

## 2017-07-09 MED ORDER — SODIUM CHLORIDE 0.9 % IR SOLN
Status: DC | PRN
  Administered 2017-07-09: 15000 mL

## 2017-07-09 MED ORDER — CARBIDOPA-LEVODOPA 25-100 MG PO TABS
2.5000 | ORAL_TABLET | Freq: Three times a day (TID) | ORAL | Status: DC
  Administered 2017-07-09 – 2017-07-10 (×3): 2.5 via ORAL
  Filled 2017-07-09 (×4): qty 3

## 2017-07-09 MED ORDER — FENTANYL CITRATE (PF) 100 MCG/2ML IJ SOLN
INTRAMUSCULAR | Status: AC
  Administered 2017-07-09: 50 ug via INTRAVENOUS
  Filled 2017-07-09: qty 2

## 2017-07-09 MED ORDER — DEXAMETHASONE SODIUM PHOSPHATE 10 MG/ML IJ SOLN
INTRAMUSCULAR | Status: DC | PRN
  Administered 2017-07-09: 10 mg via INTRAVENOUS

## 2017-07-09 MED ORDER — EPHEDRINE 5 MG/ML INJ
INTRAVENOUS | Status: AC
  Filled 2017-07-09: qty 10

## 2017-07-09 MED ORDER — HYDROCODONE-ACETAMINOPHEN 5-325 MG PO TABS: 1.0000 | ORAL_TABLET | ORAL | Status: DC | PRN

## 2017-07-09 MED ORDER — PHENYLEPHRINE 40 MCG/ML (10ML) SYRINGE FOR IV PUSH (FOR BLOOD PRESSURE SUPPORT)
PREFILLED_SYRINGE | INTRAVENOUS | Status: DC | PRN
  Administered 2017-07-09: 80 ug via INTRAVENOUS

## 2017-07-09 MED ORDER — LIDOCAINE 2% (20 MG/ML) 5 ML SYRINGE
INTRAMUSCULAR | Status: AC
  Filled 2017-07-09: qty 5

## 2017-07-09 MED ORDER — MIDAZOLAM HCL 5 MG/5ML IJ SOLN
INTRAMUSCULAR | Status: DC | PRN
  Administered 2017-07-09: 2 mg via INTRAVENOUS

## 2017-07-09 MED ORDER — BELLADONNA-OPIUM 16.2-30 MG RE SUPP: 1.0000 | Freq: Four times a day (QID) | RECTAL | Status: DC | PRN

## 2017-07-09 MED ORDER — HYDROMORPHONE HCL 1 MG/ML IJ SOLN
0.5000 mg | INTRAMUSCULAR | Status: DC | PRN
  Administered 2017-07-10: 1 mg via INTRAVENOUS
  Filled 2017-07-09: qty 1

## 2017-07-09 MED ORDER — OXYBUTYNIN CHLORIDE 5 MG PO TABS
5.0000 mg | ORAL_TABLET | Freq: Three times a day (TID) | ORAL | Status: DC | PRN
  Administered 2017-07-10: 5 mg via ORAL
  Filled 2017-07-09: qty 1

## 2017-07-09 MED ORDER — GLYCOPYRROLATE 0.2 MG/ML IV SOSY
PREFILLED_SYRINGE | INTRAVENOUS | Status: AC
  Filled 2017-07-09: qty 5

## 2017-07-09 SURGICAL SUPPLY — 19 items
BAG URINE DRAINAGE (UROLOGICAL SUPPLIES) ×2 IMPLANT
BAG URO CATCHER STRL LF (MISCELLANEOUS) ×2 IMPLANT
CATH FOLEY 3WAY 30CC 22FR (CATHETERS) ×1 IMPLANT
CATH HEMA 3WAY 30CC 22FR COUDE (CATHETERS) IMPLANT
COVER FOOTSWITCH UNIV (MISCELLANEOUS) ×2 IMPLANT
COVER SURGICAL LIGHT HANDLE (MISCELLANEOUS) ×2 IMPLANT
ELECT REM PT RETURN 15FT ADLT (MISCELLANEOUS) ×1 IMPLANT
GLOVE BIOGEL M STRL SZ7.5 (GLOVE) ×2 IMPLANT
GOWN STRL REUS W/TWL LRG LVL3 (GOWN DISPOSABLE) ×2 IMPLANT
GOWN STRL REUS W/TWL XL LVL3 (GOWN DISPOSABLE) ×2 IMPLANT
HOLDER FOLEY CATH W/STRAP (MISCELLANEOUS) ×2 IMPLANT
LOOP CUT BIPOLAR 24F LRG (ELECTROSURGICAL) ×1 IMPLANT
MANIFOLD NEPTUNE II (INSTRUMENTS) ×2 IMPLANT
PACK CYSTO (CUSTOM PROCEDURE TRAY) ×2 IMPLANT
PIN SAFETY STERILE (MISCELLANEOUS) ×2 IMPLANT
RUBBERBAND STERILE (MISCELLANEOUS) ×1 IMPLANT
SET ASPIRATION TUBING (TUBING) IMPLANT
SYRINGE IRR TOOMEY STRL 70CC (SYRINGE) ×2 IMPLANT
TUBING CONNECTING 10 (TUBING) ×2 IMPLANT

## 2017-07-09 NOTE — Transfer of Care (Signed)
Immediate Anesthesia Transfer of Care Note  Patient: Adam Cordova  Procedure(s) Performed: TRANSURETHRAL RESECTION OF THE PROSTATE (TURP), BIPOLAR (N/A )  Patient Location: PACU  Anesthesia Type:General  Level of Consciousness: awake, drowsy and responds to stimulation  Airway & Oxygen Therapy: Patient Spontanous Breathing and Patient connected to face mask oxygen  Post-op Assessment: Report given to RN and Post -op Vital signs reviewed and stable  Post vital signs: Reviewed and stable  Last Vitals:  Vitals:   07/09/17 0944  BP: (!) 154/99  Pulse: 71  Resp: 16  Temp: 36.7 C  SpO2: 100%    Last Pain:  Vitals:   07/09/17 0955  TempSrc:   PainSc: 0-No pain      Patients Stated Pain Goal: 4 (59/27/63 9432)  Complications: No apparent anesthesia complications

## 2017-07-09 NOTE — Anesthesia Preprocedure Evaluation (Signed)
Anesthesia Evaluation  Patient identified by MRN, date of birth, ID band Patient awake    Reviewed: Allergy & Precautions, NPO status , Patient's Chart, lab work & pertinent test results  Airway Mallampati: II  TM Distance: >3 FB Neck ROM: Full    Dental no notable dental hx.    Pulmonary neg pulmonary ROS,    Pulmonary exam normal breath sounds clear to auscultation       Cardiovascular negative cardio ROS Normal cardiovascular exam Rhythm:Regular Rate:Normal     Neuro/Psych parkinsons dz negative neurological ROS  negative psych ROS   GI/Hepatic negative GI ROS, Neg liver ROS,   Endo/Other  negative endocrine ROS  Renal/GU negative Renal ROS  negative genitourinary   Musculoskeletal negative musculoskeletal ROS (+)   Abdominal   Peds negative pediatric ROS (+)  Hematology negative hematology ROS (+)   Anesthesia Other Findings   Reproductive/Obstetrics negative OB ROS                             Anesthesia Physical Anesthesia Plan  ASA: III  Anesthesia Plan: General   Post-op Pain Management:    Induction: Intravenous  PONV Risk Score and Plan: 2 and Ondansetron, Dexamethasone and Treatment may vary due to age or medical condition  Airway Management Planned: LMA  Additional Equipment:   Intra-op Plan:   Post-operative Plan: Extubation in OR  Informed Consent: I have reviewed the patients History and Physical, chart, labs and discussed the procedure including the risks, benefits and alternatives for the proposed anesthesia with the patient or authorized representative who has indicated his/her understanding and acceptance.   Dental advisory given  Plan Discussed with: CRNA and Surgeon  Anesthesia Plan Comments:         Anesthesia Quick Evaluation

## 2017-07-09 NOTE — Interval H&P Note (Signed)
History and Physical Interval Note:  07/09/2017 10:17 AM  Adam Cordova  has presented today for surgery, with the diagnosis of BENIGN PROSTATIC HYPERPLASIA WITH INCOMPLETE BLADDER EMPTYING  The various methods of treatment have been discussed with the patient and family. After consideration of risks, benefits and other options for treatment, the patient has consented to  Procedure(s): TRANSURETHRAL RESECTION OF THE PROSTATE (TURP), BIPOLAR (N/A) as a surgical intervention .  [name] history has been reviewed, patient examined, no change in status, stable for surgery.  I have reviewed the patient's chart and labs.  Questions were answered to the patient's satisfaction.     Conception Oms Mykael Trott

## 2017-07-09 NOTE — Op Note (Signed)
Operative Note  Preoperative diagnosis:  1.  BPH with lower urinary tract symptoms 2.  History of Parkinson's disease  Postoperative diagnosis: 1.  BPH with lower urinary tract symptoms 2.  History of Parkinson's disease  Procedure(s): 1.  Bipolar TURP  Surgeon: Ellison Hughs, MD  Assistants:  None  Anesthesia:  General   Complications:  None  EBL:  50 mL  Specimens: 1. Prostate chips  Drains/Catheters: 1.  22 French 3-way Foley catheter with 30 mL in the balloon  Intraoperative findings:  Tri-lobar prostatic urethral obstruction.    Indication:  Adam Cordova is a 61 y.o. male with a long history of BPH with lower urinary tract symptoms that include urgency/frequency, weak force of stream and nocturia 3 and Parkinson's disease. He is status post TURP in 2014, which improved his voiding symptoms tremendously for a brief period. He had a cystoscopy that was performed in the office that demonstrated trilobar prostatic obstruction. He has tried medical therapy for his BPH, with little to no improvement of his voiding symptoms.  Description of procedure:  After informed consent was obtained, the patient was brought to the operating room and general LMA anesthesia was administered. The patient was then placed in the dorsolithotomy position and prepped and draped in usual sterile fashion. A timeout was performed. A 21 French rigid cystoscope was then inserted into the urethral meatus and advanced into the bladder under direct vision. A complete bladder survey revealed no intravesical pathology.  The rigid cystoscope was then exchanged for a 26 French resectoscope with a bipolar loop working element. Both ureteral orifices were identified and well away from the area of resection. The lateral and middle lobes of the prostate were then resected, starting at the bladder neck and progressing note more distal than the verumontanum.  All prostate chips were then hand irrigated out of  the bladder and sent to pathology for analysis. The area resection within the prostatic urethra was then extensively fulgurated until hemostasis was achieved. A 22 French three-way Foley catheter was then inserted with its balloon inflated with 30 mL. The bladder was then extensively hand irrigated until the irrigant returned clear to light pink. The catheter was then placed to continuous irrigation and light traction. The patient tolerated the procedure well and was transferred to the postanesthesia in stable condition.  Plan: Monitor the patient overnight with continuous bladder irrigation and light traction. Likely home tomorrow.

## 2017-07-09 NOTE — Anesthesia Procedure Notes (Signed)
Procedure Name: LMA Insertion Date/Time: 07/09/2017 12:15 PM Performed by: Talbot Grumbling, CRNA Pre-anesthesia Checklist: Emergency Drugs available, Suction available, Patient being monitored and Patient identified Patient Re-evaluated:Patient Re-evaluated prior to induction Oxygen Delivery Method: Circle system utilized Preoxygenation: Pre-oxygenation with 100% oxygen Induction Type: IV induction Ventilation: Mask ventilation without difficulty LMA: LMA inserted LMA Size: 4.0 Number of attempts: 1 Placement Confirmation: positive ETCO2 and breath sounds checked- equal and bilateral Tube secured with: Tape Dental Injury: Teeth and Oropharynx as per pre-operative assessment

## 2017-07-09 NOTE — Anesthesia Postprocedure Evaluation (Signed)
Anesthesia Post Note  Patient: Adam Cordova  Procedure(s) Performed: TRANSURETHRAL RESECTION OF THE PROSTATE (TURP), BIPOLAR (N/A )     Patient location during evaluation: PACU Anesthesia Type: General Level of consciousness: awake and alert Pain management: pain level controlled Vital Signs Assessment: post-procedure vital signs reviewed and stable Respiratory status: spontaneous breathing, nonlabored ventilation, respiratory function stable and patient connected to nasal cannula oxygen Cardiovascular status: blood pressure returned to baseline and stable Postop Assessment: no apparent nausea or vomiting Anesthetic complications: no    Last Vitals:  Vitals:   07/09/17 1415 07/09/17 1430  BP: (!) 162/98 (!) 154/100  Pulse: 83 74  Resp: 16 17  Temp:    SpO2: 100% 99%    Last Pain:  Vitals:   07/09/17 1430  TempSrc:   PainSc: 6                  Jayleana Colberg S

## 2017-07-10 DIAGNOSIS — N401 Enlarged prostate with lower urinary tract symptoms: Secondary | ICD-10-CM | POA: Diagnosis not present

## 2017-07-10 LAB — BASIC METABOLIC PANEL
ANION GAP: 7 (ref 5–15)
BUN: 15 mg/dL (ref 6–20)
CO2: 24 mmol/L (ref 22–32)
Calcium: 8.5 mg/dL — ABNORMAL LOW (ref 8.9–10.3)
Chloride: 110 mmol/L (ref 101–111)
Creatinine, Ser: 0.79 mg/dL (ref 0.61–1.24)
GFR calc Af Amer: >60 mL/min (ref >60)
Glucose, Bld: 131 mg/dL — ABNORMAL HIGH (ref 65–99)
POTASSIUM: 4.2 mmol/L (ref 3.5–5.1)
SODIUM: 141 mmol/L (ref 135–145)

## 2017-07-10 LAB — CBC
HCT: 41.4 % (ref 39.0–52.0)
Hemoglobin: 13.5 g/dL (ref 13.0–17.0)
MCH: 26.9 pg (ref 26.0–34.0)
MCHC: 32.6 g/dL (ref 30.0–36.0)
MCV: 82.6 fL (ref 78.0–100.0)
PLATELETS: 244 10*3/uL (ref 150–400)
RBC: 5.01 MIL/uL (ref 4.22–5.81)
RDW: 13.8 % (ref 11.5–15.5)
WBC: 11.3 10*3/uL — AB (ref 4.0–10.5)

## 2017-07-10 NOTE — Progress Notes (Signed)
1 Day Post-Op Subjective: The patient reports intermittent suprapubic pain following surgery.  Appears confused and is taking a while to answer questions.  His wife, at bedside, states that he will frequently get confused after anesthesia.  Afebrile overnight.   Objective: Vital signs in last 24 hours: Temp:  [98.1 F (36.7 C)-98.8 F (37.1 C)] 98.4 F (36.9 C) (11/08 0700) Pulse Rate:  [71-98] 75 (11/08 0700) Resp:  [12-18] 18 (11/08 0700) BP: (119-162)/(71-103) 119/75 (11/08 0700) SpO2:  [95 %-100 %] 95 % (11/08 0700)  Intake/Output from previous day: 11/07 0701 - 11/08 0700 In: 9195 [P.O.:440; I.V.:1955; IV Piggyback:100] Out: 9660 [Urine:9650; Blood:10]  Intake/Output this shift: Total I/O In: -  Out: 100 [Urine:100]  Physical Exam:  General: Alert and oriented CV: RRR, palpable distal pulses Lungs: CTAB, equal chest rise Abdomen: Soft, NTND, no rebound or guarding GU: Foley in place and draining clear-yellow urine w/o CBI. Ext: NT, No erythema  Lab Results: Recent Labs    07/07/17 1033 07/09/17 1404 07/10/17 0431  HGB 14.3 13.6 13.5  HCT 39.9 39.0 41.4   BMET Recent Labs    07/09/17 1404 07/10/17 0431  NA 140 141  K 4.0 4.2  CL 107 110  CO2 24 24  GLUCOSE 121* 131*  BUN 16 15  CREATININE 0.89 0.79  CALCIUM 8.6* 8.5*     Studies/Results: No results found.  Assessment/Plan:  POD 1 s/p TURP  -Will continue to monitor the patient's confusion.  Likely post-anesthetic effect.  Continue IV Ancef for 24 hours.  WBC slightly elevated, but afebrile -CBI as needed -OOBTC and ambulate -Likely home today with Foley catheter   LOS: 0 days   Ellison Hughs, MD 07/10/2017, 10:12 AM  [facility] Pager: [phone]

## 2017-07-10 NOTE — Discharge Instructions (Signed)
Keep Foley catheter to drainage at all times.  No driving on pain medications.

## 2017-07-10 NOTE — Care Management Note (Signed)
Case Management Note  Patient Details  Name: HANNAN HUTMACHER MRN: [id number] Date of Birth: [birth year]  Subjective/Objective: Pt admitted for BPH with obstruction/lower UT symptoms                   Action/Plan: Pt and wife asked for [facility] to come out to visit pt related to Foley Cath.   Expected Discharge Date:  07/10/17               Expected Discharge Plan:  [place]  In-House Referral:     Discharge planning Services  CM Consult  Post Acute Care Choice:    Choice offered to:  Patient, Spouse  DME Arranged:    DME Agency:     HH Arranged:  RN [place] Agency:  [place]  Status of Service:  Completed, signed off  If discussed at [place] of Stay Meetings, dates discussed:    Additional CommentsPurcell Mouton, RN 07/10/2017, 2:16 PM

## 2017-07-10 NOTE — Progress Notes (Signed)
Pt discharged to home instructions given acknowledge understanding. SRp,RN

## 2017-07-10 NOTE — Progress Notes (Signed)
Patient had pain in the bladder. The CBI rate was increased and pain medication given.

## 2017-07-10 NOTE — Progress Notes (Signed)
The patient stated that the pain he had in the bladder was no longer present.

## 2017-07-10 NOTE — Care Management Note (Signed)
Case Management Note  Patient Details  Name: Adam Cordova MRN: [id number] Date of Birth: Dec 29, 1955  Subjective/Objective:  Pt admitted with BPH with obstruction/lower urinary tract symptoms.                  Action/Plan: Plan to discharge home with no needs.   Expected Discharge Date:  07/10/17               Expected Discharge Plan:  Home/Self Care  In-House Referral:     Discharge planning Services  CM Consult  Post Acute Care Choice:    Choice offered to:     DME Arranged:    DME Agency:     HH Arranged:    HH Agency:     Status of Service:  Completed, signed off  If discussed at [employer] of Stay Meetings, dates discussed:    Additional CommentsPurcell Mouton, RN 07/10/2017, 2:10 PM

## 2017-07-11 NOTE — Discharge Summary (Signed)
Date of admission: 07/09/2017  Date of discharge: 07/10/2017  Admission diagnosis: BPH with lower urinary tract symptoms   Discharge diagnosis: Same  Secondary diagnoses: Parkinson's disease  Procedures: Bipolar TURP on 07/09/17  History and Physical: For full details, please see admission history and physical. Briefly, Adam Cordova is a 61 y.o. year old patient with BPH/LUTS.   Hospital Course: Monitored on the floor post-op with no acute events.  Discharged home on POD1 with his Foley.  Voiding trial in one week in the office.  Laboratory values:  Recent Labs    07/09/17 1404 07/10/17 0431  HGB 13.6 13.5  HCT 39.0 41.4   Recent Labs    07/09/17 1404 07/10/17 0431  CREATININE 0.89 0.79    Disposition: Home  Discharge instruction: The patient was instructed to be ambulatory but told to refrain from heavy lifting, strenuous activity, or driving.   Discharge medications:  Allergies as of 07/10/2017      Reactions   Bee Venom Anaphylaxis   Levaquin [levofloxacin Hemihydrate] Anaphylaxis, Swelling   Sertraline Diarrhea, Nausea Only      Medication List    TAKE these medications   carbidopa-levodopa 25-100 MG tablet Commonly known as:  SINEMET IR 2.5 tablets at 7am/2 tablets at 10am/2.5 tablets at 2pm/2.5 tablets at 6 pm What changed:    how much to take  how to take this  when to take this  additional instructions   donepezil 10 MG tablet Commonly known as:  ARICEPT Take 10 mg by mouth at bedtime.   HYDROcodone-acetaminophen 5-325 MG tablet Commonly known as:  NORCO Take 1 tablet every 4 (four) hours as needed by mouth for moderate pain.   naproxen sodium 220 MG tablet Commonly known as:  ALEVE Take 440 mg by mouth 2 (two) times daily as needed (for pain.).   phenazopyridine 200 MG tablet Commonly known as:  PYRIDIUM Take 1 tablet (200 mg total) 3 (three) times daily as needed by mouth for pain.   sulfamethoxazole-trimethoprim 800-160 MG  tablet Commonly known as:  BACTRIM DS,SEPTRA DS Take 1 tablet 2 (two) times daily for 7 days by mouth.   tamsulosin 0.4 MG Caps capsule Commonly known as:  FLOMAX Take 0.4 mg by mouth 2 (two) times daily.       Followup:  Follow-up Information    Ceasar Mons, MD On 07/15/2017.   Specialty:  Urology Contact information: [address] [place]  [phone]

## 2017-07-13 DIAGNOSIS — G4719 Other hypersomnia: Secondary | ICD-10-CM | POA: Diagnosis not present

## 2017-07-13 NOTE — Procedures (Signed)
Patient Name: Adam Cordova, Adam Cordova Date: 07/03/2017 Gender: Male D.O.B: September 08, 1955 Age (years): 61 Referring Provider: Wells Guiles Tat Height (inches): 24 Interpreting Physician: Baird Lyons MD, ABSM Weight (lbs): 170 RPSGT: Laren Everts BMI: 25 MRN: [id number] Neck Size: 17.50 CLINICAL INFORMATION Sleep Study Type: NPSG  Indication for sleep study: Daytime Fatigue, Non-refreshing Sleep, Parasomnias, Snoring, Witnesses Apnea / Gasping During Sleep  Epworth Sleepiness Score: 22  SLEEP STUDY TECHNIQUE As per the AASM Manual for the Scoring of Sleep and Associated Events v2.3 (April 2016) with a hypopnea requiring 4% desaturations.  The channels recorded and monitored were frontal, central and occipital EEG, electrooculogram (EOG), submentalis EMG (chin), nasal and oral airflow, thoracic and abdominal wall motion, anterior tibialis EMG, snore microphone, electrocardiogram, and pulse oximetry.  MEDICATIONS Medications self-administered by patient taken the night of the study : none reported  SLEEP ARCHITECTURE The study was initiated at 10:16:47 PM and ended at 4:58:18 AM.  Sleep onset time was 98.8 minutes and the sleep efficiency was 23.0%. The total sleep time was 92.5 minutes.  Stage REM latency was N/A minutes.  The patient spent 62.70% of the night in stage N1 sleep, 37.30% in stage N2 sleep, 0.00% in stage N3 and 0.00% in REM.  Alpha intrusion was absent.  Supine sleep was 100.00%.  RESPIRATORY PARAMETERS The overall apnea/hypopnea index (AHI) was 3.9 per hour. There were 6 total apneas, including 4 obstructive, 2 central and 0 mixed apneas. There were 0 hypopneas and 4 RERAs.  The AHI during Stage REM sleep was N/A per hour.  AHI while supine was 3.9 per hour.  The mean oxygen saturation was 95.93%. The minimum SpO2 during sleep was 93.00%.  moderate snoring was noted during this study.  CARDIAC DATA The 2 lead EKG demonstrated sinus rhythm. The mean heart rate  was 59.63 beats per minute. Other EKG findings include: PVCs.  LEG MOVEMENT DATA The total PLMS were 137 with a resulting PLMS index of 88.86. Associated arousal with leg movement index was 0.6 .  IMPRESSIONS - No significant obstructive sleep apnea occurred during this study (AHI = 3.9/h). - No significant central sleep apnea occurred during this study (CAI = 1.3/h). - The patient had minimal or no oxygen desaturation during the study (Min O2 = 93.00%) - The patient snored with moderate snoring volume. - Patient had significant difficulty initiating and maintaining sleep. Latency to sleep onset 98 minutes and total sleep time only 92 minutes, with no REM. - EKG findings include PVCs. - Severe periodic limb movements of sleep occurred during the study. No significant associated arousals.  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 - Sleep hygiene should be reviewed to assess factors that may improve sleep quality. - Weight management and regular exercise should be initiated or continued if appropriate.  [Electronically signed] 07/13/2017 09:31 AM  Baird Lyons MD, [place], American Board of Sleep Medicine   NPI: [id number]

## 2017-07-14 ENCOUNTER — Telehealth: Payer: Self-pay | Admitting: Neurology

## 2017-07-14 DIAGNOSIS — G4719 Other hypersomnia: Secondary | ICD-10-CM

## 2017-07-14 NOTE — Telephone Encounter (Signed)
Mychart message sent to patient.

## 2017-07-14 NOTE — Addendum Note (Signed)
Addended by: Annamaria Helling on: 07/14/2017 08:58 AM   Modules accepted: Orders

## 2017-07-14 NOTE — Telephone Encounter (Signed)
-----   Message from [place], DO sent at 07/14/2017  7:22 AM EST ----- Adam Cordova, his PSG looked good.  He was supposed to have MSLT if PSG was normal.  What happened to that?

## 2017-07-15 DIAGNOSIS — R3914 Feeling of incomplete bladder emptying: Secondary | ICD-10-CM | POA: Diagnosis not present

## 2017-07-21 DIAGNOSIS — R351 Nocturia: Secondary | ICD-10-CM | POA: Diagnosis not present

## 2017-07-21 DIAGNOSIS — R3914 Feeling of incomplete bladder emptying: Secondary | ICD-10-CM | POA: Diagnosis not present

## 2017-07-21 DIAGNOSIS — N401 Enlarged prostate with lower urinary tract symptoms: Secondary | ICD-10-CM | POA: Diagnosis not present

## 2017-08-19 ENCOUNTER — Ambulatory Visit: Payer: PPO | Admitting: Psychology

## 2017-08-19 ENCOUNTER — Encounter: Payer: Self-pay | Admitting: Psychology

## 2017-08-19 DIAGNOSIS — G20 Parkinson's disease: Secondary | ICD-10-CM

## 2017-08-19 DIAGNOSIS — R413 Other amnesia: Secondary | ICD-10-CM

## 2017-08-19 DIAGNOSIS — F4321 Adjustment disorder with depressed mood: Secondary | ICD-10-CM
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
## 2017-08-20 ENCOUNTER — Ambulatory Visit: Payer: PPO | Admitting: Psychology

## 2017-08-20 DIAGNOSIS — R413 Other amnesia: Secondary | ICD-10-CM

## 2017-08-20 DIAGNOSIS — G20 Parkinson's disease: Secondary | ICD-10-CM | POA: Diagnosis not present

## 2017-08-20 NOTE — Progress Notes (Signed)
   Neuropsychology Note  Adam Cordova returned today for 3 hours of neuropsychological testing with technician, Milana Kidney, BS, under the supervision of Dr. Macarthur Critchley. The patient did not appear overtly distressed by the testing session, per behavioral observation or via self-report to the technician. Rest breaks were offered. Adam Cordova will return within 2 weeks for a feedback session with Dr. [name] at which time his test performances, clinical impressions and treatment recommendations will be reviewed in detail. The patient understands he can contact our office should he require our assistance before this time.  Full report to follow.

## 2017-08-21 ENCOUNTER — Encounter: Payer: Self-pay | Admitting: Psychology

## 2017-08-21 DIAGNOSIS — L821 Other seborrheic keratosis: Secondary | ICD-10-CM | POA: Diagnosis not present

## 2017-08-21 DIAGNOSIS — Z85828 Personal history of other malignant neoplasm of skin: Secondary | ICD-10-CM | POA: Diagnosis not present

## 2017-08-21 DIAGNOSIS — R351 Nocturia: Secondary | ICD-10-CM | POA: Diagnosis not present

## 2017-08-21 DIAGNOSIS — Z86008 Personal history of in-situ neoplasm of other site: Secondary | ICD-10-CM | POA: Diagnosis not present

## 2017-08-21 DIAGNOSIS — N401 Enlarged prostate with lower urinary tract symptoms: Secondary | ICD-10-CM | POA: Diagnosis not present

## 2017-08-21 DIAGNOSIS — D225 Melanocytic nevi of trunk: Secondary | ICD-10-CM | POA: Diagnosis not present

## 2017-08-21 DIAGNOSIS — L570 Actinic keratosis: Secondary | ICD-10-CM | POA: Diagnosis not present

## 2017-08-21 DIAGNOSIS — Z23 Encounter for immunization: Secondary | ICD-10-CM | POA: Diagnosis not present

## 2017-08-21 DIAGNOSIS — R3914 Feeling of incomplete bladder emptying: Secondary | ICD-10-CM | POA: Diagnosis not present
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
## 2017-09-04 ENCOUNTER — Ambulatory Visit: Payer: PPO | Admitting: Psychology

## 2017-09-04 ENCOUNTER — Encounter: Payer: Self-pay | Admitting: Psychology

## 2017-09-04 DIAGNOSIS — G20 Parkinson's disease: Secondary | ICD-10-CM | POA: Diagnosis not present

## 2017-09-04 DIAGNOSIS — F0280 Dementia in other diseases classified elsewhere without behavioral disturbance: Secondary | ICD-10-CM

## 2017-09-04 NOTE — Patient Instructions (Signed)
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 Adjustment disorder with depressed mood. Formal cognitive evaluation reveals global declines relative to above-average baseline intellectual function. The most pronounced impairment is noted in processing speed (even when the motor component is removed). Memory encoding and aspects of executive functioning including mental flexibility and deductive reasoning are also impaired. Engineer, water memory are also below expectation. Semantic retrieval and auditory comprehension are areas of relative strength. Additionally, in terms of strengths, on memory testing his performances suggest that he does consolidate what is encoded, albeit a low level of encoding. Overall, his cognitive profile is NOT consistent with superimposed Alzheimer's disease. His test results and cognitive profile are in line with what is commonly seen in PD dementia. I suspect severely reduced mental processing speed is a contributor to other cognitive deficits, such as memory encoding impairment. Given the level of impairment on formal testing, and the evidence of functional impairment in daily life, diagnostic criteria for dementia (mild) are met.  Clinical interview and self-report questionnaires do not indicate clinical depression but he is experiencing some depressive symptoms which are likely due to both biological and psychosocial aspects of PD.   Recommendations/Plan: Based on the findings of the present evaluation, the following recommendations are offered:  1. Increased time to process information, repetition of information, and reduction of external distractions should all be implemented in order to help compensate for slowed processing speed. 2. The patient wonders if he might have some hearing loss. Audiology evaluation could be helpful. I suspect he is just experiencing slowed processing of auditory information, but  formal hearing evaluation would be helpful to rule out superimposed hearing loss, and hearing aids could help if that is the case. 3. He is strongly encouraged to continue participating in [employer] [facility], [employer], and PD groups, in order to receive physical, emotional and brain health benefits. 4. Monitor mood. Initiation of antidepressant medication and/or counseling could be considered. 5. He will likely benefit from having a regular routine and daily/weekly schedule including planned enjoyable activities and socialization.

## 2017-10-07 ENCOUNTER — Ambulatory Visit (HOSPITAL_BASED_OUTPATIENT_CLINIC_OR_DEPARTMENT_OTHER): Payer: PPO | Attending: Neurology | Admitting: Internal Medicine

## 2017-10-07 DIAGNOSIS — G4711 Idiopathic hypersomnia with long sleep time: Secondary | ICD-10-CM | POA: Diagnosis not present

## 2017-10-07 DIAGNOSIS — G4710 Hypersomnia, unspecified: Secondary | ICD-10-CM | POA: Diagnosis present

## 2017-10-07 DIAGNOSIS — G4719 Other hypersomnia: Secondary | ICD-10-CM

## 2017-10-07 DIAGNOSIS — G47419 Narcolepsy without cataplexy: Secondary | ICD-10-CM | POA: Insufficient documentation

## 2017-10-11 DIAGNOSIS — G4719 Other hypersomnia: Secondary | ICD-10-CM

## 2017-10-11 NOTE — Procedures (Signed)
    Patient Name: Adam Cordova, Depaulo Date: 10/07/2017 Gender: Male D.O.B: 1956-07-29 Age (years): 62 Referring Provider: Wells Guiles Tat Height (inches): 15 Interpreting Physician: Baird Lyons MD, ABSM Weight (lbs): 170 RPSGT: Jacolyn Reedy BMI: 25 MRN: [id number] Neck Size: 17.50 <br> <br> CLINICAL INFORMATION Sleep Study Type: MSL The patient was referred to the sleep center for evaluation of daytime sleepiness.  Epworth Sleepiness Score: 22  Most recent polysomnogram dated 07/03/2017 revealed an AHI of 3.9/h and RDI of 6.5/h. TST 92.5 minutes, 0 REM, + PLMS, no medication . SLEEP STUDY TECHNIQUE A Multiple Sleep Latency Test was performed after an overnight polysomnogram according to the AASM scoring manual v2.3 (April 2016) and clinical guidelines. Five nap opportunities occurred over the course of the test which followed an overnight polysomnogram. The channels recorded and monitored were frontal, central, and occipital electroencephalography (EEG), right and left electrooculogram (EOG), chin electromyography (EMG), and electrocardiogram (EKG).  MEDICATIONS Medications taken by the patient : carbidopa- levodopa Medications administered by patient during sleep study : No sleep medicine administered.  IMPRESSIONS - Total number of naps attempted: 5.00 . Total number of naps with sleep attained: 5.00 . The Mean Sleep Latency was 00:28 seconds. The patient was asleep as soon as 4 of the 5 naps trials were begun. Carbidopa-levodopa was take twice, at 10:00AM and 2:00 PM. - The patient appears to have pathologic sleepiness, evidenced by a short mean sleep latency (8 minutes or less) on this MSLT. - 2 sleep onset REMs were noted during this MSLT. - Standard test protocol was not followed. NPSG was not done the night before, and medication was taken during the study. Reference NPSG on 07/03/17 had noted significant difficulty initiating and maintaining sleep, with TST 92.5 minutes, REM  absent, periodic limb movement sleep disturbance.  - Clinical interpretation required. 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  DIAGNOSIS - Idiopathic Hypersomnia  - Narcolepsy  RECOMMENDATIONS         If clinically appropriate consider managing as Narcolepsy  [Electronically signed] 10/11/2017 11:38 AM  Baird Lyons MD, ABSM Diplomate, American Board of Sleep Medicine   NPI: [id number]                         [place] [place] [facility]  ELECTRONICALLY SIGNED ON:  10/11/2017, 11:40 AM [place] PH: [phone]   FX: [phone] [place]

## 2017-10-11 NOTE — Procedures (Deleted)
   Patient Name: Adam Cordova, Adam Cordova Date: 10/07/2017 Gender: Male D.O.B: [birth year] Age (years): 62 Referring Provider: Wells Guiles Tat Height (inches): 75 Interpreting Physician: Baird Lyons MD, ABSM Weight (lbs): 170 RPSGT: Jacolyn Reedy BMI: 25 MRN: [id number] Neck Size: 17.50 <br> <br> CLINICAL INFORMATION Sleep Study Type: MSLT    The patient was referred to the sleep center for evaluation of daytime sleepiness.    Epworth Sleepiness Score: 22   Most recent polysomnogram dated 07/03/2017 revealed an AHI of 3.9/h and RDI of 6.5/h. TST 92.5 minutes, 0 REM, + PLMS, no medication. SLEEP STUDY TECHNIQUE A Multiple Sleep Latency Test was performed after an overnight polysomnogram according to the AASM scoring manual v2.3 (April 2016) and clinical guidelines. Five nap opportunities occurred over the course of the test which followed an overnight polysomnogram. The channels recorded and monitored were frontal, central, and occipital electroencephalography (EEG), right and left electrooculogram (EOG), chin electromyography (EMG), and electrocardiogram (EKG).  MEDICATIONS Medications taken by the patient : carbidopa- levodopa Medications administered by patient during sleep study : No sleep medicine administered. IMPRESSIONS - Total number of naps attempted: 5.00 . Total number of naps with sleep attained: 5.00 . The Mean Sleep Latency was 00:28 seconds. The patient was asleep as soon as 4 of the 5 naps trials were begun. Carbidopa-levodopa was take twice, at 10:00AM and 2:00 PM. - The patient appears to have pathologic sleepiness, evidenced by a short mean sleep latency (8 minutes or less) on this MSLT. - 2 sleep onset REMs were noted during this MSLT. - Standard test protocol was not followed. NPSG was not done the night before, and medication was taken during the study. Reference NPSG on 07/03/17 had noted significant difficulty initiating and maintaining sleep, with TST 92.5  minutes, REM absent, periodic limb movement sleep disturbance.  - Clinical interpretation required. 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 DIAGNOSIS - Idiopathic Hypersomnia  - Narcolepsy RECOMMENDATIONS         If clinically appropriate consider managing as Narcolepsy  [Electronically signed] 10/11/2017 11:38 AM  Baird Lyons MD, ABSM Diplomate, American Board of Sleep Medicine   NPI: [id number]                         [place], American Board of Sleep Medicine  ELECTRONICALLY SIGNED ON:  10/11/2017, 11:19 AM [place] PH: [phone]   FX: [phone] [place]
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
## 2017-10-17 ENCOUNTER — Ambulatory Visit: Payer: PPO | Admitting: Neurology

## 2017-10-17 ENCOUNTER — Encounter: Payer: Self-pay | Admitting: Neurology

## 2017-10-17 VITALS — BP 92/60 | HR 74 | Ht 70.0 in | Wt 163.0 lb

## 2017-10-17 DIAGNOSIS — G47429 Narcolepsy in conditions classified elsewhere without cataplexy: Secondary | ICD-10-CM | POA: Diagnosis not present

## 2017-10-17 DIAGNOSIS — G20 Parkinson's disease: Secondary | ICD-10-CM

## 2017-10-17 DIAGNOSIS — F0280 Dementia in other diseases classified elsewhere without behavioral disturbance: Secondary | ICD-10-CM | POA: Diagnosis not present

## 2017-10-17 DIAGNOSIS — G20A1 Parkinson's disease without dyskinesia, without mention of fluctuations: Secondary | ICD-10-CM

## 2017-10-17 MED ORDER — MODAFINIL 200 MG PO TABS: 200.0000 mg | ORAL_TABLET | Freq: Every day | ORAL | 0 refills | Status: DC

## 2017-10-17 MED ORDER — ENTACAPONE 200 MG PO TABS: 200.0000 mg | ORAL_TABLET | Freq: Four times a day (QID) | ORAL | 1 refills | Status: DC

## 2017-10-17 NOTE — Patient Instructions (Signed)
1. Start Comtan 200 mg - one tablet four times daily with each dose of Levodopa  2. After one week of starting Comtan you can start Provigil - 1 tablet in the morning.  3. You have been referred to [facility] for physical and occupational therapy. They will call you directly to schedule an appointment.  Please call [phone] if you do not hear from them.

## 2017-10-22 ENCOUNTER — Encounter: Payer: Self-pay | Admitting: Neurology

## 2017-10-23 ENCOUNTER — Ambulatory Visit: Payer: PPO | Admitting: Neurology

## 2017-10-29 ENCOUNTER — Encounter: Payer: Self-pay | Admitting: Neurology

## 2017-11-03 ENCOUNTER — Other Ambulatory Visit: Payer: Self-pay | Admitting: Neurology

## 2017-11-03 ENCOUNTER — Encounter: Payer: Self-pay | Admitting: Neurology

## 2017-11-11 ENCOUNTER — Telehealth: Payer: Self-pay | Admitting: Neurology

## 2017-11-11 NOTE — Telephone Encounter (Signed)
-----   Message from Carey Bullocks sent at 11/11/2017 10:50 AM EDT ----- Regarding: FW: cx 3/13 evals Dr. Carles Collet  I know you referred this patient back for more therapy, but he decided to wait until summer. See below message - his wife called to let our front office know.  Claiborne Billings, OT  ----- Message ----- From: Kaylyn Lim Sent: 11/11/2017  10:21 AM To: Myrtice Lauth, PT Subject: cx 3/13 evals                                  Per wife, they want to wait and start therapy in the summer.  This way they will have more time to attend consistently.

## 2017-11-11 NOTE — Telephone Encounter (Signed)
PT documentation noted that pt refused therapy right now

## 2017-11-12 ENCOUNTER — Ambulatory Visit: Payer: PPO | Admitting: Occupational Therapy

## 2017-11-12 ENCOUNTER — Ambulatory Visit: Payer: PPO | Admitting: Physical Therapy

## 2018-01-07 DIAGNOSIS — Z136 Encounter for screening for cardiovascular disorders: Secondary | ICD-10-CM | POA: Diagnosis not present

## 2018-01-07 DIAGNOSIS — Z0000 Encounter for general adult medical examination without abnormal findings: Secondary | ICD-10-CM | POA: Diagnosis not present

## 2018-01-07 DIAGNOSIS — N400 Enlarged prostate without lower urinary tract symptoms: Secondary | ICD-10-CM | POA: Diagnosis not present

## 2018-01-07 DIAGNOSIS — I10 Essential (primary) hypertension: Secondary | ICD-10-CM | POA: Diagnosis not present

## 2018-01-07 DIAGNOSIS — G20 Parkinson's disease: Secondary | ICD-10-CM | POA: Diagnosis not present

## 2018-01-07 DIAGNOSIS — E7800 Pure hypercholesterolemia, unspecified: Secondary | ICD-10-CM | POA: Diagnosis not present

## 2018-01-07 DIAGNOSIS — N529 Male erectile dysfunction, unspecified: Secondary | ICD-10-CM | POA: Diagnosis not present

## 2018-02-10 ENCOUNTER — Encounter: Payer: Self-pay | Admitting: Neurology

## 2018-02-11 ENCOUNTER — Other Ambulatory Visit: Payer: Self-pay | Admitting: *Deleted

## 2018-02-11 MED ORDER — DONEPEZIL HCL 10 MG PO TABS: 10.0000 mg | ORAL_TABLET | Freq: Every day | ORAL | 5 refills | Status: DC

## 2018-02-11 NOTE — Telephone Encounter (Signed)
Are you going to take over prescribing this medication?

## 2018-02-16 DIAGNOSIS — G20 Parkinson's disease: Secondary | ICD-10-CM | POA: Diagnosis not present

## 2018-02-16 DIAGNOSIS — R3914 Feeling of incomplete bladder emptying: Secondary | ICD-10-CM | POA: Diagnosis not present

## 2018-02-16 DIAGNOSIS — R351 Nocturia: Secondary | ICD-10-CM | POA: Diagnosis not present

## 2018-02-16 DIAGNOSIS — N401 Enlarged prostate with lower urinary tract symptoms: Secondary | ICD-10-CM | POA: Diagnosis not present
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
## 2018-02-23 ENCOUNTER — Ambulatory Visit (INDEPENDENT_AMBULATORY_CARE_PROVIDER_SITE_OTHER): Payer: PPO | Admitting: Neurology

## 2018-02-23 ENCOUNTER — Encounter: Payer: Self-pay | Admitting: Neurology

## 2018-02-23 VITALS — BP 100/74 | HR 81 | Ht 70.0 in | Wt 162.0 lb

## 2018-02-23 DIAGNOSIS — G20 Parkinson's disease: Secondary | ICD-10-CM | POA: Diagnosis not present

## 2018-02-23 DIAGNOSIS — G47429 Narcolepsy in conditions classified elsewhere without cataplexy: Secondary | ICD-10-CM

## 2018-02-23 DIAGNOSIS — F0280 Dementia in other diseases classified elsewhere without behavioral disturbance: Secondary | ICD-10-CM | POA: Diagnosis not present

## 2018-02-23 MED ORDER — LEVODOPA 42 MG IN CAPS: 42.0000 mg | ORAL_CAPSULE | RESPIRATORY_TRACT | 0 refills | Status: DC

## 2018-02-23 MED ORDER — BUPROPION HCL ER (XL) 150 MG PO TB24: 150.0000 mg | ORAL_TABLET | Freq: Every day | ORAL | 0 refills | Status: DC

## 2018-03-09 ENCOUNTER — Encounter: Payer: Self-pay | Admitting: Neurology

## 2018-03-11 DIAGNOSIS — L729 Follicular cyst of the skin and subcutaneous tissue, unspecified: Secondary | ICD-10-CM | POA: Diagnosis not present

## 2018-03-17 ENCOUNTER — Other Ambulatory Visit: Payer: Self-pay | Admitting: Neurology

## 2018-03-20 ENCOUNTER — Ambulatory Visit: Payer: PPO | Attending: Neurology | Admitting: Physical Therapy

## 2018-03-20 ENCOUNTER — Encounter: Payer: Self-pay | Admitting: Physical Therapy

## 2018-03-20 DIAGNOSIS — R2689 Other abnormalities of gait and mobility: Secondary | ICD-10-CM | POA: Insufficient documentation

## 2018-03-20 DIAGNOSIS — R29818 Other symptoms and signs involving the nervous system: Secondary | ICD-10-CM

## 2018-03-20 DIAGNOSIS — R293 Abnormal posture: Secondary | ICD-10-CM | POA: Diagnosis not present

## 2018-03-20 DIAGNOSIS — R2681 Unsteadiness on feet: Secondary | ICD-10-CM

## 2018-03-20 NOTE — Therapy (Signed)
[facility] [address] [place], [place], [address] Phone: [phone]   Fax:  [phone]  Physical Therapy Evaluation  Patient Details  Name: Adam Cordova MRN: [id number] Date of Birth: [birth year] Referring Provider: Alonza Bogus   Encounter Date: 03/20/2018  PT End of Session - 03/20/18 1908    Visit Number  1    Number of Visits  9    Date for PT Re-Evaluation  05/19/18    Authorization Type  [employer]-10th visit progress noted needed    PT Start Time  0802    PT Stop Time  0846    PT Time Calculation (min)  44 min    Equipment Utilized During Treatment  Gait belt    Activity Tolerance  Patient tolerated treatment well    Behavior During Therapy  [facility] for tasks assessed/performed       Past Medical History:  Diagnosis Date  . BPH (benign prostatic hypertrophy) with urinary retention   . Foley catheter in place   . Parkinson disease [facility])    neurologist--  dr Richardean Chimera  in [place]  . UTI (urinary tract infection)     Past Surgical History:  Procedure Laterality Date  . TRANSURETHRAL RESECTION OF PROSTATE N/A 11/23/2012   Procedure: TRANSURETHRAL RESECTION OF THE PROSTATE WITH GYRUS INSTRUMENTS;  Surgeon: Bernestine Amass, MD;  Location: [facility];  Service: Urology;  Laterality: N/A;  . TRANSURETHRAL RESECTION OF PROSTATE N/A 07/09/2017   Procedure: TRANSURETHRAL RESECTION OF THE PROSTATE (TURP), BIPOLAR;  Surgeon: Ceasar Mons, MD;  Location: [facility];  Service: Urology;  Laterality: N/A;    There were no vitals filed for this visit.   Subjective Assessment - 03/20/18 0807    Subjective  Feel like things are going pretty well.  Have had some increase in the medication.  Maybe averaging 1 falls every 2 weeks.  Wife reports doesn't have a pattern of falls-yesterday fell twice.  Otherwise, he goes through a long spell without falls.  Has cane, but keeps it under the bed.   Patient feels he is not ready for using a Cassar.    Patient is accompained by:  Family member wife    Patient Stated Goals  Per wife:  to work on getting out of a chair, to try to stop crossing feet when getting out of the car.    Currently in Pain?  Yes    Pain Score  7     Pain Location  Foot    Pain Orientation  Left;Lateral    Pain Descriptors / Indicators  Sharp    Pain Onset  More than a month ago    Pain Frequency  Intermittent    Aggravating Factors   walking, putting weight on it    Pain Relieving Factors  not being on it    Effect of Pain on Daily Activities  PT will monitor pain, but will not address as a goal at this time.         [facility] PT Assessment - 03/20/18 0817      Assessment   Medical Diagnosis  Parkinson's disease    Referring Provider  TAt, Wells Guiles    Onset Date/Surgical Date  02/23/18 MD visit    Prior Therapy  Last bout of therapy ended 04/2017      Precautions   Precautions  Fall    Precaution Comments  No driving      Balance Screen  Has the patient fallen in the past 6 months  Yes    How many times?  multiple reports 1 fall per week     Has the patient had a decrease in activity level because of a fear of falling?   Yes    Is the patient reluctant to leave their home because of a fear of falling?   No      Home Environment   Living Environment  Private residence    Living Arrangements  Spouse/significant other    Available Help at Discharge  Family    Type of [place] to enter    Entrance Stairs-Number of Steps  1    Entrance Stairs-Rails  None    Home Layout  One level    [place] - single point trekking poles      Prior Function   Level of [place] with basic ADLs;Independent with household mobility without device    Leisure  Enjoys [employer]      Cognition   Overall Cognitive Status  History of cognitive impairments - at baseline      Observation/Other Assessments    Focus on Therapeutic Outcomes (FOTO)   NA      ROM / Strength   AROM / PROM / Strength  Strength      Strength   Overall Strength  Deficits    Overall Strength Comments  Grossly tested 5/5 hip flexors, quads, hamstrings, bilateral ankle dorsiflexion 4/5; bilateral ankle eversion 3+/5      Transfers   Transfers  Sit to Stand;Stand to Sit    Sit to Stand  6: Modified independent (Device/Increase time);Without upper extremity assist;From chair/3-in-1    Five time sit to stand comments   8.43    Stand to Sit  6: Modified independent (Device/Increase time);Without upper extremity assist;To chair/3-in-1    Comments  Wife reports difficulty from lower surfaces like car and sofa      Ambulation/Gait   Ambulation/Gait  Yes    Ambulation/Gait Assistance  4: Min guard;5: Supervision    Ambulation/Gait Assistance Details  Pt typically ambulates with no assistive device.  Trialed rollator [place] and U-step RW this visit, with slowed pace of gait noted with both devices)    Ambulation Distance (Feet)  200 Feet    Assistive device  None;Rollator U-step Rolling Laidler    Gait Pattern  Step-through pattern;Step-to pattern;Decreased step length - right;Decreased step length - left;Scissoring;Narrow base of support;Poor foot clearance - right Increased toeing in RLE    Ambulation Surface  Level;Indoor    Gait velocity  10.15 sec no device = 3.23 ft/sec    Gait Comments  Rollator:  12.5 sec (2.62 ft/sec), U-step RW 12.4 sec (2.65 ft/sec)      Standardized Balance Assessment   Standardized Balance Assessment  Timed Up and Go Test      Timed Up and Go Test   Normal TUG (seconds)  9.47    Manual TUG (seconds)  10.09    TUG Comments  No device; scores>13.5 -14.5 sec indicate increased fall risk.                Objective measurements completed on examination: See above findings.              PT Education - 03/20/18 1907    Education Details  Based on falls, gait pattern with  scissoring and freezing episodes, [place] is  recommended for safety; discussed options for rollator and U-step RW    Person(s) Educated  Patient;Spouse    Methods  Explanation;Demonstration    Comprehension  Verbalized understanding          PT Long Term Goals - 03/20/18 1915      PT LONG TERM GOAL #1   Title  Pt will peform HEP with wife's supervision, for improved balance and gait.  TARGET 04/17/18    Time  4    Period  Weeks    Status  New    Target Date  04/17/18      PT LONG TERM GOAL #2   Title  Pt will ambulate at least 1000 ft, indoors and outdoors with supervision with appropriate assistive device, for improved gait in community.    Time  4    Period  Weeks    Status  New    Target Date  04/17/18      PT LONG TERM GOAL #3   Title  Pt will perform at least 8 of 10 reps of sit<>stand from surfaces lower than 18" height, no posterior lean for improved transfer safety and efficiency.    Time  4    Period  Weeks    Status  New    Target Date  04/17/18      PT LONG TERM GOAL #4   Title  Pt/wife will verbalize understanding of fall prevention in home environment.     Time  4    Period  Weeks    Status  New    Target Date  04/17/18             Plan - 03/20/18 1908    Clinical Impression Statement  Pt is a 62 year old male who presents to OP PT with history of Parkinson's disease, frequent falls, likely Parkinson's dementia, for recommendations on appropiate Gillie.  Pt presents to OP PT with abnormal posture, postural instability (posterior lean at times upon standing), decreased balance, decreased safety awareness with gait, decreased timing and coordination with gait.  Trialed rollator Sgroi today and U-step rolling Mcfate, both of which slowed patient's gait pace.  Pt will benefit from skilled PT to address the above stated deficits and to find appropriate Krikorian for safety with gait in home and in community.    History and Personal Factors relevant to plan of  care:  Hx of Parkinson's disease, Parkinson's dementia, frequent falls    Clinical Presentation  Stable    Clinical Presentation due to:  Parkinson's as neurodegenerative disease    Clinical Decision Making  Low    Rehab Potential  Fair    PT Frequency  2x / week    PT Duration  4 weeks may reduce down to 1x/wk for 2 weeks     PT Treatment/Interventions  ADLs/Self Care Home Management;DME Instruction;Balance training;Therapeutic activities;Therapeutic exercise;Functional mobility training;Gait training;Neuromuscular re-education;Patient/family education    PT Next Visit Plan  Trial rollator Arrants, trial U-step RW, ?Up [place]?, gait in household and community situations to decide on which M[place]ance is appropriate; sit<>stand from low surfaces    Consulted and Agree with Plan of Care  Patient;Family member/caregiver       Patient will benefit from skilled therapeutic intervention in order to improve the following deficits and impairments:  Abnormal gait, Decreased balance, Decreased safety awareness, Decreased mobility, Difficulty walking, Decreased strength, Postural dysfunction  Visit Diagnosis: Unsteadiness on feet  Other abnormalities of gait and mobility  Abnormal posture  Other symptoms and signs involving the nervous system     Problem List Patient Active Problem List   Diagnosis Date Noted  . Parkinson's disease ([place]) 10/17/2017  . 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[place]) 10/17/2017  . 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  . BPH with obstruction/lower urinary tract symptoms 07/09/2017  . 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 Acute urinary retention 11/23/2012  . BPH (benign prostatic hyperplasia) 11/23/2012    Cashmere Harmes W. 03/20/2018, 7:19 PM  Frazier Butt., PT   [facility] [address] [place], [place], [address] Phone: [phone]   Fax:   [phone]  Name: EVA VALLEE MRN: [id number] Date of Birth: 10-Oct-1955

## 2018-03-22 ENCOUNTER — Other Ambulatory Visit: Payer: Self-pay | Admitting: Neurology

## 2018-03-25 ENCOUNTER — Encounter: Payer: Self-pay | Admitting: Physical Therapy

## 2018-03-25 ENCOUNTER — Ambulatory Visit: Payer: PPO | Admitting: Physical Therapy

## 2018-03-25 DIAGNOSIS — R2681 Unsteadiness on feet: Secondary | ICD-10-CM

## 2018-03-25 DIAGNOSIS — R2689 Other abnormalities of gait and mobility: Secondary | ICD-10-CM

## 2018-03-26 NOTE — Therapy (Signed)
[facility] [address] [place], [place], [address] Phone: [phone]   Fax:  [phone]  Physical Therapy Treatment  Patient Details  Name: Adam Cordova MRN: [id number] Date of Birth: Mar 02, 1956 Referring Provider: Alonza Bogus   Encounter Date: 03/25/2018  PT End of Session - 03/26/18 0755    Visit Number  2    Number of Visits  9    Date for PT Re-Evaluation  05/19/18    Authorization Type  [employer]-10th visit progress noted needed    PT Start Time  0803    PT Stop Time  0844    PT Time Calculation (min)  41 min    Equipment Utilized During Treatment  Gait belt    Activity Tolerance  Patient tolerated treatment well    Behavior During Therapy  [facility] for tasks assessed/performed       Past Medical History:  Diagnosis Date  . BPH (benign prostatic hypertrophy) with urinary retention   . Foley catheter in place   . Parkinson disease [facility])    neurologist--  dr Richardean Chimera  in [place]  . UTI (urinary tract infection)     Past Surgical History:  Procedure Laterality Date  . TRANSURETHRAL RESECTION OF PROSTATE N/A 11/23/2012   Procedure: TRANSURETHRAL RESECTION OF THE PROSTATE WITH GYRUS INSTRUMENTS;  Surgeon: Bernestine Amass, MD;  Location: [facility];  Service: Urology;  Laterality: N/A;  . TRANSURETHRAL RESECTION OF PROSTATE N/A 07/09/2017   Procedure: TRANSURETHRAL RESECTION OF THE PROSTATE (TURP), BIPOLAR;  Surgeon: Ceasar Mons, MD;  Location: [facility];  Service: Urology;  Laterality: N/A;    There were no vitals filed for this visit.  Subjective Assessment - 03/25/18 0804    Subjective  I'm in a good mood today-sometimes my medications have that affect on me.  No falls since eval.    Patient is accompained by:  Family member wife    Patient Stated Goals  Per wife:  to work on getting out of a chair, to try to stop crossing feet when getting out of the car.    Currently in  Pain?  Yes    Pain Score  3     Pain Location  Foot    Pain Orientation  Left;Lateral    Pain Descriptors / Indicators  Sharp    Pain Onset  More than a month ago    Pain Frequency  Intermittent    Aggravating Factors   walking, putting weigth on it    Pain Relieving Factors  not sure                       [facility] Adult PT Treatment/Exercise - 03/25/18 0813      Ambulation/Gait   Ambulation/Gait  Yes    Ambulation/Gait Assistance  4: Min guard;5: Supervision    Ambulation/Gait Assistance Details  Trialed rolling Singleterry (pt reports "kind of jerky"), rollator ("definitely smoother and light, which is not always a good thing"); U-step RW ("about the same as the one before it").  Trialed UP Walkerx 200 ft ("maybe a little bit more control and responded easily than the other two")    Ambulation Distance (Feet)  200 Feet RW, 300 ft rollator, 300 ft U-step RW    Assistive device  Rolling Neuner;Rollator U-step RW, UPWalker    Gait Pattern  Step-through pattern;Step-to pattern;Decreased step length - right;Decreased step length - left;Scissoring;Narrow base of support;Poor foot clearance - right  Increased RLE internal rotation    Ambulation Surface  Level;Indoor;Outdoor    Curb  4: Chief Operating Officer Details (indicate cue type and reason)  Outdoor curb training using U-step RW-cues and assistance for safe placement of RW and use of braking mechanism.    Gait Comments  Gait training on outdoor surfaces x 300 ft, on sidewalk, using U-step RW, with supervision, cues for staying close to RW and for attention to foot placement.         Provided patient with description of benefits/limitations of each Gotsch.  PT provides cues for locking/unlocking brakes as necessary and for technique/sequence for transfers/turning to sit.          PT Long Term Goals - 03/20/18 1915      PT LONG TERM GOAL #1   Title  Pt will peform HEP with wife's supervision, for improved balance and gait.   TARGET 04/17/18    Time  4    Period  Weeks    Status  New    Target Date  04/17/18      PT LONG TERM GOAL #2   Title  Pt will ambulate at least 1000 ft, indoors and outdoors with supervision with appropriate assistive device, for improved gait in community.    Time  4    Period  Weeks    Status  New    Target Date  04/17/18      PT LONG TERM GOAL #3   Title  Pt will perform at least 8 of 10 reps of sit<>stand from surfaces lower than 18" height, no posterior lean for improved transfer safety and efficiency.    Time  4    Period  Weeks    Status  New    Target Date  04/17/18      PT LONG TERM GOAL #4   Title  Pt/wife will verbalize understanding of fall prevention in home environment.     Time  4    Period  Weeks    Status  New    Target Date  04/17/18            Plan - 03/26/18 0759    Clinical Impression Statement  Treatment session focused on trial of various assistive devices for optimal safety with gait, including rolling Kia, rollator, U-step RW, Up Schranz.  Thus far, PT and patient ruled out rolling Joss and rollator at options.  He is open to conitnueing to try U-step RW and UpWalker.  Use of U-Step and UpWalker allow patient to slow pace of gait, for more attention to his foot placement, but he continues to need cues for foot placement to avoid scissoring with gait, especially with turns.    Rehab Potential  Fair    PT Frequency  2x / week    PT Duration  4 weeks may reduce down to 1x/wk for 2 weeks     PT Treatment/Interventions  ADLs/Self Care Home Management;DME Instruction;Balance training;Therapeutic activities;Therapeutic exercise;Functional mobility training;Gait training;Neuromuscular re-education;Patient/family education    PT Next Visit Plan  Trial again U-step RW and UpWalker, wife available for education; may send one of those devices home for trial over the weekend; sit<>stand transfers and turns (to prevent crossover steps)    Consulted and Agree  with Plan of Care  Patient       Patient will benefit from skilled therapeutic intervention in order to improve the following deficits and impairments:  Abnormal gait, Decreased balance, Decreased safety awareness,  Decreased mobility, Difficulty walking, Decreased strength, Postural dysfunction  Visit Diagnosis: Other abnormalities of gait and mobility  Unsteadiness on feet     Problem List Patient Active Problem List   Diagnosis Date Noted  . Parkinson's disease ([place]) 10/17/2017  . 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[place]) 10/17/2017  . 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  . BPH with obstruction/lower urinary tract symptoms 07/09/2017  . 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 Acute urinary retention 11/23/2012  . BPH (benign prostatic hyperplasia) 11/23/2012    Arye Weyenberg W. 03/26/2018, 9:11 AM  Frazier Butt., PT   [facility] [address] [place], [place], [address] Phone: [phone]   Fax:  [phone]  Name: JALIEN WEAKLAND MRN: [id number] Date of Birth: [birth year]

## 2018-03-27 ENCOUNTER — Encounter: Payer: Self-pay | Admitting: Physical Therapy

## 2018-03-27 ENCOUNTER — Ambulatory Visit: Payer: PPO | Admitting: Physical Therapy

## 2018-03-27 DIAGNOSIS — R2681 Unsteadiness on feet: Secondary | ICD-10-CM | POA: Diagnosis not present

## 2018-03-27 DIAGNOSIS — R293 Abnormal posture: Secondary | ICD-10-CM

## 2018-03-27 DIAGNOSIS — R2689 Other abnormalities of gait and mobility: Secondary | ICD-10-CM

## 2018-03-27 NOTE — Therapy (Signed)
[facility] [address] [place], [place], [address] Phone: [phone]   Fax:  [phone]  Physical Therapy Treatment  Patient Details  Name: Adam Cordova MRN: [id number] Date of Birth: 08-05-1956 Referring Provider: Alonza Bogus   Encounter Date: 03/27/2018  PT End of Session - 03/27/18 1104    Visit Number  3    Number of Visits  9    Date for PT Re-Evaluation  05/19/18    Authorization Type  [employer]-10th visit progress noted needed    PT Start Time  0804    PT Stop Time  0845    PT Time Calculation (min)  41 min    Activity Tolerance  Patient tolerated treatment well    Behavior During Therapy  [facility] for tasks assessed/performed       Past Medical History:  Diagnosis Date  . BPH (benign prostatic hypertrophy) with urinary retention   . Foley catheter in place   . Parkinson disease [facility])    neurologist--  dr Richardean Chimera  in [place]  . UTI (urinary tract infection)     Past Surgical History:  Procedure Laterality Date  . TRANSURETHRAL RESECTION OF PROSTATE N/A 11/23/2012   Procedure: TRANSURETHRAL RESECTION OF THE PROSTATE WITH GYRUS INSTRUMENTS;  Surgeon: Bernestine Amass, MD;  Location: [facility];  Service: Urology;  Laterality: N/A;  . TRANSURETHRAL RESECTION OF PROSTATE N/A 07/09/2017   Procedure: TRANSURETHRAL RESECTION OF THE PROSTATE (TURP), BIPOLAR;  Surgeon: Ceasar Mons, MD;  [facility];  Service: Urology;  Laterality: N/A;    There were no vitals filed for this visit.  Subjective Assessment - 03/27/18 0806    Subjective  No changes, no questions from the other day.  My foot actually doesn't hurt today.    Patient is accompained by:  Family member wife    Patient Stated Goals  Per wife:  to work on getting out of a chair, to try to stop crossing feet when getting out of the car.    Currently in Pain?  No/denies    Pain Onset  More than a month ago                        [facility] Adult PT Treatment/Exercise - 03/27/18 0001      Ambulation/Gait   Ambulation/Gait  Yes    Ambulation/Gait Assistance  4: Min guard;5: Supervision    Ambulation/Gait Assistance Details  U-step RW x 300 ft, including turns and changes of directions, with cues to widen BOS and stay close to H[place]., then trial of UpWalker x 300 ft, with cues to increase step length and heelstrike.  With UpWalker, pt tends to lean forward on armrests and have overall decreased foot clearance.  Rollator Wilensky trial again 300 ft with cues for R foot clearance and R heelstrike.    Ambulation Distance (Feet)  300 Feet x 3    Assistive device  Rollator U-step RW, UpWalker    Gait Pattern  Step-through pattern;Step-to pattern;Decreased step length - right;Decreased step length - left;Scissoring;Narrow base of support;Poor foot clearance - right    Ambulation Surface  Level;Indoor;Outdoor    Curb  4: Chief Operating Officer Details (indicate cue type and reason)  Outdoor curb training using U-step RW and using weighted rollator Kelm with cues for technique and placement of [place].    Gait Comments  Gait training on outdoor surfaces x 200 ft, on  sidewalk, using U-step RW, then using weighted rollator with supervision, cues for staying close to RW and for attention to foot placement.      Self-Care   Self-Care  Other Self-Care Comments    Other Self-Care Comments   Wife present for PT session today, with discussion of limitations/benefits of each Molnar trialed today.  Ruled out [employer] as possibility due to decreased overall foot clearance and increased forward lean onto armrests.  Wife/patient open to either rollator or U-step RW, though pt reports feeling he is not ready to make decision until he tries at home.  Allowed patient to take Clinic's U-step RW home for the weekend, with wife's supervision, to bring back at next visit.             PT Education - 03/27/18 1103     Education Details  Benefits/limitations of rollator/U-step RW/UpWalker and trial at home of U-step RW over the weekend    Person(s) Educated  Patient;Spouse    Methods  Explanation;Demonstration;Verbal cues;Handout    Comprehension  Verbalized understanding;Returned demonstration;Verbal cues required Wife verbalizes understanding          PT Long Term Goals - 03/20/18 1915      PT LONG TERM GOAL #1   Title  Pt will peform HEP with wife's supervision, for improved balance and gait.  TARGET 04/17/18    Time  4    Period  Weeks    Status  New    Target Date  04/17/18      PT LONG TERM GOAL #2   Title  Pt will ambulate at least 1000 ft, indoors and outdoors with supervision with appropriate assistive device, for improved gait in community.    Time  4    Period  Weeks    Status  New    Target Date  04/17/18      PT LONG TERM GOAL #3   Title  Pt will perform at least 8 of 10 reps of sit<>stand from surfaces lower than 18" height, no posterior lean for improved transfer safety and efficiency.    Time  4    Period  Weeks    Status  New    Target Date  04/17/18      PT LONG TERM GOAL #4   Title  Pt/wife will verbalize understanding of fall prevention in home environment.     Time  4    Period  Weeks    Status  New    Target Date  04/17/18            Plan - 03/27/18 1104    Clinical Impression Statement  Wife present during PT session for continued trial of various assistive devices.  In addition to U-step RW and [employer], wife would like to see rollator Canny.  Decided today that [place] is not going to be appropriate option due to patient leaning too far forward on armrests and has decreased foot clearance.  Educated patient and wife that he will continue to need supervision with whichever Luiz we choose, with cues for slowed pace, foot clearance/R heelstrike.  Pt takes clinic U-step RW home for weekend trial.    Rehab Potential  Fair    PT Frequency  2x / week    PT  Duration  4 weeks may reduce down to 1x/wk for 2 weeks     PT Treatment/Interventions  ADLs/Self Care Home Management;DME Instruction;Balance training;Therapeutic activities;Therapeutic exercise;Functional mobility training;Gait training;Neuromuscular re-education;Patient/family education    PT Next  Visit Plan  Follow up on U-step RW trial at home; sit<>stand transfers from low surfaces, turns (to prevent crossover steps), ankle strengthening?    Consulted and Agree with Plan of Care  Patient;Family member/caregiver    Family Member Consulted  wife       Patient will benefit from skilled therapeutic intervention in order to improve the following deficits and impairments:  Abnormal gait, Decreased balance, Decreased safety awareness, Decreased mobility, Difficulty walking, Decreased strength, Postural dysfunction  Visit Diagnosis: Other abnormalities of gait and mobility  Abnormal posture     Problem List Patient Active Problem List   Diagnosis Date Noted  . Parkinson's disease ([place]) 10/17/2017  . 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[place]) 10/17/2017  . 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  . BPH with obstruction/lower urinary tract symptoms 07/09/2017  . 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 Acute urinary retention 11/23/2012  . BPH (benign prostatic hyperplasia) 11/23/2012    MARRIOTT,AMY W. 03/27/2018, 11:08 AM  Frazier Butt., PT   [facility] [address] [place], [place], [address] Phone: [phone]   Fax:  [phone]  Name: Adam Cordova MRN: [id number] Date of Birth: [birth year]

## 2018-03-30 ENCOUNTER — Encounter: Payer: Self-pay | Admitting: Physical Therapy

## 2018-03-30 ENCOUNTER — Ambulatory Visit: Payer: PPO | Admitting: Physical Therapy

## 2018-03-30 DIAGNOSIS — R2689 Other abnormalities of gait and mobility: Secondary | ICD-10-CM

## 2018-03-30 DIAGNOSIS — R2681 Unsteadiness on feet: Secondary | ICD-10-CM

## 2018-03-30 DIAGNOSIS — R293 Abnormal posture: Secondary | ICD-10-CM

## 2018-03-30 NOTE — Therapy (Signed)
[facility] [address] [place], [place], [address] Phone: [phone]   Fax:  [phone]  Physical Therapy Treatment  Patient Details  Name: Adam Cordova MRN: [id number] Date of Birth: 1956-07-13 Referring Provider: Alonza Bogus   Encounter Date: 03/30/2018  PT End of Session - 03/30/18 1058    Visit Number  4    Number of Visits  9    Date for PT Re-Evaluation  05/19/18    Authorization Type  [employer]age-10th visit progress noted needed    PT Start Time  0804    PT Stop Time  0846    PT Time Calculation (min)  42 min    Equipment Utilized During Treatment  Gait belt    Activity Tolerance  Patient tolerated treatment well    Behavior During Therapy  [facility] for tasks assessed/performed       Past Medical History:  Diagnosis Date  . BPH (benign prostatic hypertrophy) with urinary retention   . Foley catheter in place   . Parkinson disease [facility])    neurologist--  dr Richardean Chimera  in [place]  . UTI (urinary tract infection)     Past Surgical History:  Procedure Laterality Date  . TRANSURETHRAL RESECTION OF PROSTATE N/A 11/23/2012   Procedure: TRANSURETHRAL RESECTION OF THE PROSTATE WITH GYRUS INSTRUMENTS;  Surgeon: Bernestine Amass, MD;  Location: [facility];  Service: Urology;  Laterality: N/A;  . TRANSURETHRAL RESECTION OF PROSTATE N/A 07/09/2017   Procedure: TRANSURETHRAL RESECTION OF THE PROSTATE (TURP), BIPOLAR;  Surgeon: Ceasar Mons, MD;  [facility];  Service: Urology;  Laterality: N/A;    There were no vitals filed for this visit.  Subjective Assessment - 03/30/18 0807    Subjective  Obviously, we brought the (U-step) Ailey back in today.  Didn't use in the house, but did use in the grocery store.  Wife thought it helped; she reports patient didn't think it was helpful.    Patient is accompained by:  Family member wife    Patient Stated Goals  Per wife:  to work on  getting out of a chair, to try to stop crossing feet when getting out of the car.    Currently in Pain?  No/denies    Pain Onset  More than a month ago                       [facility] Adult PT Treatment/Exercise - 03/30/18 0811      Transfers   Transfers  Sit to Stand;Stand to Sit    Sit to Stand  6: Modified independent (Device/Increase time);Without upper extremity assist;From chair/3-in-1    Stand to Sit  6: Modified independent (Device/Increase time);Without upper extremity assist;To chair/3-in-1    Transfer Cueing  Cues for widened BOS upon standing to allow for weightshift and initial stepping to avoid crossover stepping.      Ambulation/Gait   Ambulation/Gait  Yes    Ambulation/Gait Assistance  4: Min assist with cane; min guard with rollator    Ambulation Distance (Feet)  230 Feet cane; 100 ft with rollator    Assistive device  Straight cane;Rollator with small rubber tip    Gait Pattern  Step-through pattern;Step-to pattern;Decreased step length - right;Decreased step length - left;Scissoring;Narrow base of support;Poor foot clearance - right    Ambulation Surface  Level;Indoor    Gait Comments  Pt brings in straight cane with small rubber tip today from home (  he doesn't typically use); gait with cane with cueing on technique:  pt has difficulty with correct sequence and hits cane with RLE multiple times.  Noted to have scissoring with cane, and unable to perform reciprocal cane sequence, even with hand over hand assistance.  PT explained that cane is not a safe option for gait assistance at this time.  Pt unable to verbalize well how the U-step RW trial was at home.  He didn't feel it made a difference.  Wife reports he appeared steadier when they took it out in the community.  Allowed pt to trial rollator Hartl between now and next appointment.  Educated patient and wife that she will need to provide close supervision with use of rollator trial.      Neuro Re-ed     Neuro Re-ed Details   Worked on seated PWR! stepping-out/out, in/in with large, deliberate movement patterns to simulate getting in and out of car.  Simulated further car transfers, on corner of mat, with large amplitude stepping to side, then to center, 3 reps with max verbal and visual cues.  Standing turning practice, using sidestep/quarter/clock turn method, 3-4 reps each direction, with verbal and tactile cues for technique and slowed pace for improved turns to avoid crossover steps with turns.             PT Education - 03/30/18 1101    Education Details  Added to HEP:  seated PWR! Step (to assist with car transfer)    Person(s) Educated  Spouse;Patient    Methods  Explanation;Demonstration;Tactile cues;Verbal cues;Handout    Comprehension  Verbalized understanding;Returned demonstration;Verbal cues required;Need further instruction          PT Long Term Goals - 03/20/18 1915      PT LONG TERM GOAL #1   Title  Pt will peform HEP with wife's supervision, for improved balance and gait.  TARGET 04/17/18    Time  4    Period  Weeks    Status  New    Target Date  04/17/18      PT LONG TERM GOAL #2   Title  Pt will ambulate at least 1000 ft, indoors and outdoors with supervision with appropriate assistive device, for improved gait in community.    Time  4    Period  Weeks    Status  New    Target Date  04/17/18      PT LONG TERM GOAL #3   Title  Pt will perform at least 8 of 10 reps of sit<>stand from surfaces lower than 18" height, no posterior lean for improved transfer safety and efficiency.    Time  4    Period  Weeks    Status  New    Target Date  04/17/18      PT LONG TERM GOAL #4   Title  Pt/wife will verbalize understanding of fall prevention in home environment.     Time  4    Period  Weeks    Status  New    Target Date  04/17/18            Plan - 03/30/18 1059    Clinical Impression Statement  Today's skilled PT session focused on:  review of how  U-step rollator trial went at home, trial of cane per pt/wife request, and brief trial of rollator for trial at home, transfer training and car transfer training with focus on large stepping with legs and foot position to avoid crossover steps,  and turning practice to avoid cross-over steps with turns and gait initiation.  Pt needs multi-modal cues and slowed pace for techniques for transfers and turns practiced in this session, and educated wife that she will continue to need to cue and assist patient for safety with these techniques.    Rehab Potential  Fair    PT Frequency  2x / week    PT Duration  4 weeks may reduce down to 1x/wk for 2 weeks     PT Treatment/Interventions  ADLs/Self Care Home Management;DME Instruction;Balance training;Therapeutic activities;Therapeutic exercise;Functional mobility training;Gait training;Neuromuscular re-education;Patient/family education    PT Next Visit Plan  Follow up on rollator trial at home; sit<>stand transfers from low surfaces, turns (to prevent crossover steps), ankle strengthening?    Consulted and Agree with Plan of Care  Patient;Family member/caregiver    Family Member Consulted  wife       Patient will benefit from skilled therapeutic intervention in order to improve the following deficits and impairments:  Abnormal gait, Decreased balance, Decreased safety awareness, Decreased mobility, Difficulty walking, Decreased strength, Postural dysfunction  Visit Diagnosis: Other abnormalities of gait and mobility  Unsteadiness on feet  Abnormal posture     Problem List Patient Active Problem List   Diagnosis Date Noted  . Parkinson's disease ([place]) 10/17/2017  . 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[place]) 10/17/2017  . 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  . BPH with obstruction/lower urinary tract symptoms 07/09/2017  . 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 Acute urinary retention  11/23/2012  . BPH (benign prostatic hyperplasia) 11/23/2012    Ison Wichmann W. 03/30/2018, 11:58 AM  Frazier Butt., PT   [facility] [address] [place], [place], [address] Phone: [phone]   Fax:  [phone]  Name: Adam Cordova MRN: [id number] Date of Birth: 1956-06-30

## 2018-03-30 NOTE — Patient Instructions (Signed)
Seated PWR! Step pictures, 5 reps each side

## 2018-03-31 ENCOUNTER — Other Ambulatory Visit (HOSPITAL_COMMUNITY): Payer: Self-pay | Admitting: Dermatology

## 2018-03-31 ENCOUNTER — Ambulatory Visit (HOSPITAL_COMMUNITY)
Admission: RE | Admit: 2018-03-31 | Discharge: 2018-03-31 | Disposition: A | Discharge status: Home / Self Care | Payer: PPO | Source: Ambulatory Visit | Attending: Dermatology | Admitting: Dermatology

## 2018-03-31 DIAGNOSIS — D044 Carcinoma in situ of skin of scalp and neck: Secondary | ICD-10-CM | POA: Diagnosis not present

## 2018-03-31 DIAGNOSIS — M545 Low back pain, unspecified: Secondary | ICD-10-CM

## 2018-03-31 DIAGNOSIS — D434 Neoplasm of uncertain behavior of spinal cord: Secondary | ICD-10-CM | POA: Diagnosis not present

## 2018-03-31 DIAGNOSIS — L570 Actinic keratosis: Secondary | ICD-10-CM | POA: Diagnosis not present

## 2018-03-31 DIAGNOSIS — D485 Neoplasm of uncertain behavior of skin: Secondary | ICD-10-CM | POA: Diagnosis not present

## 2018-03-31 DIAGNOSIS — L821 Other seborrheic keratosis: Secondary | ICD-10-CM | POA: Diagnosis not present

## 2018-03-31 DIAGNOSIS — Z86008 Personal history of in-situ neoplasm of other site: Secondary | ICD-10-CM | POA: Diagnosis not present

## 2018-03-31 DIAGNOSIS — M47814 Spondylosis without myelopathy or radiculopathy, thoracic region: Secondary | ICD-10-CM | POA: Diagnosis not present

## 2018-03-31 DIAGNOSIS — D225 Melanocytic nevi of trunk: Secondary | ICD-10-CM | POA: Diagnosis not present

## 2018-03-31 DIAGNOSIS — Z85828 Personal history of other malignant neoplasm of skin: Secondary | ICD-10-CM | POA: Diagnosis not present

## 2018-03-31 DIAGNOSIS — M5134 Other intervertebral disc degeneration, thoracic region: Secondary | ICD-10-CM | POA: Insufficient documentation

## 2018-04-01 ENCOUNTER — Ambulatory Visit: Payer: PPO | Admitting: Physical Therapy

## 2018-04-01 ENCOUNTER — Encounter: Payer: Self-pay | Admitting: Physical Therapy

## 2018-04-01 DIAGNOSIS — R2681 Unsteadiness on feet: Secondary | ICD-10-CM | POA: Diagnosis not present

## 2018-04-01 DIAGNOSIS — R2689 Other abnormalities of gait and mobility: Secondary | ICD-10-CM

## 2018-04-01 NOTE — Patient Instructions (Signed)
Medical supply stores: [facility] on [address] [phone]  [facility] on [address] or in [place] [phone] for both locations  [facility] on [address] [phone] 

## 2018-04-02 ENCOUNTER — Telehealth: Payer: Self-pay | Admitting: Neurology

## 2018-04-02 DIAGNOSIS — G20 Parkinson's disease: Secondary | ICD-10-CM

## 2018-04-02 MED ORDER — ROLLATOR MISC
1.0000 | Freq: Every day | 0 refills | Status: DC
Start: 2018-04-02 — End: 2018-07-06

## 2018-04-02 NOTE — Telephone Encounter (Signed)
Order for Rollator placed in [place].  Amy - FYI.

## 2018-04-02 NOTE — Telephone Encounter (Signed)
-----   Message from Alda Berthold, DO sent at 04/02/2018  1:36 PM EDT ----- Please order rollator Mcdonell, thanks.   ----- Message ----- From: Frazier Butt, PT Sent: 04/02/2018  12:27 PM To: Eustace Quail Tat, DO  Please see most recent treatment note, where pt agrees/PT recommends to use of rollator [place]mann.  Please place order for rollator Lippmann in Epic if you agree.  Thank you.  Mady Haagensen, PT

## 2018-04-02 NOTE — Therapy (Signed)
[facility] [address] [place], [place], [address] Phone: [phone]   Fax:  [phone]  Physical Therapy Treatment  Patient Details  Name: Adam Cord[place] MRN: [id number] Date of Birth: 1956/07/19 Referring Provider: Alonza Bogus   Encounter Date: 04/01/2018  PT End of Session - 04/02/18 1219    Visit Number  5    Number of Visits  9    Date for PT Re-Evaluation  05/19/18    Authorization Type  [employer]-10th visit progress noted needed    PT Start Time  0803    PT Stop Time  0845    PT Time Calculation (min)  42 min    Equipment Utilized During Treatment  Gait belt    Activity Tolerance  Patient tolerated treatment well    Behavior During Therapy  [facility] for tasks assessed/performed       Past Medical History:  Diagnosis Date  . BPH (benign prostatic hypertrophy) with urinary retention   . Foley catheter in place   . Parkinson disease [facility])    neurologist--  dr Richardean Chimera  in [place]  . UTI (urinary tract infection)     Past Surgical History:  Procedure Laterality Date  . TRANSURETHRAL RESECTION OF PROSTATE N/A 11/23/2012   Procedure: TRANSURETHRAL RESECTION OF THE PROSTATE WITH GYRUS INSTRUMENTS;  Surgeon: Bernestine Amass, MD;  Location: [facility];  Service: Urology;  Laterality: N/A;  . TRANSURETHRAL RESECTION OF PROSTATE N/A 07/09/2017   Procedure: TRANSURETHRAL RESECTION OF THE PROSTATE (TURP), BIPOLAR;  Surgeon: Ceasar Mons, MD;  Location: [facility];  Service: Urology;  Laterality: N/A;    There were no vitals filed for this visit.  Subjective Assessment - 04/01/18 0805    Subjective  Haven't used the rollator since the last visit, just this morning coming back into therapy.  Wife reports that patient is not wanting to use any type of Bach[place]a, but he did agree to proceed with getting rollator Benzel to have to use for community gait.    Patient is accompained by:  Family  member wife    Patient Stated Goals  Per wife:  to work on getting out of a chair, to try to stop crossing feet when getting out of the car.    Currently in Pain?  No/denies    Pain Onset  More than a month ago                       [facility] Adult PT Treatment/Exercise - 04/01/18 0806      Transfers   Transfers  Sit to Stand;Stand to Sit    Sit to Stand  Without upper extremity assist;From chair/3-in-1;5: Supervision;4: Min guard;From bed    Stand to Sit  5: Supervision;4: Min guard;Without upper extremity assist;To chair/3-in-1;To bed    Number of Reps  10 reps;Other sets (comment) from 20" mat, then 8 reps from 16" chair    Transfer Cueing  Cues for widened BOS prior to standing.      Ambulation/Gait   Ambulation/Gait  Yes    Ambulation/Gait Assistance  4: Min guard    Ambulation/Gait Assistance Details  Rollator [place] outdoor surfaces    Ambulation Distance (Feet)  600 Feet    Assistive device  Rollator    Gait Pattern  Step-through pattern;Step-to pattern;Decreased step length - right;Decreased step length - left;Scissoring;Narrow base of support;Poor foot clearance - right RLE internal rotation>LLE internal rotation    Ambulation  Surface  Unlevel;Outdoor    Ramp  4: Min assist    Ramp Details (indicate cue type and reason)  Outdoor ramp:  cues for slow pace to negotiate downward slope, improved foot clearance for upward slope of ram    Gait Comments  Outdoor gait training with rollator Meissner, with pt requireing cues for keeping rollator close and for improved RLE foot clearance/heelstrike      Self-Care   Self-Care  Other Self-Care Comments    Other Self-Care Comments   Discussed follow-up on trials of both U-step RW and rollator Kille.  Wife reports patient is not interested in using Imran now, because he doesn't think he needs it.  She feels that they may go ahead and pursue the rollator Dudding to have it for use for longer distances and outdoor surfaces.   Discussed how to obtain rollator Jollie, including PT requesting order from MD.      Neuro Re-ed    Neuro Re-ed Details   Worked on seated PWR! stepping-out/out, in/in with large, deliberate movement patterns to simulate getting in and out of car.  Simulated further car transfers, on corner of mat, with large amplitude stepping to side, then to center, 3 reps with max verbal and visual cues.  Standing turning practice, using sidestep/quarter/clock turn method, 3-4 reps each direction, with verbal and tactile cues for technique and slowed pace for improved turns to avoid crossover steps with turns. Review of HEP from last visit      Exercises   Exercises  Ankle      Ankle Exercises: Seated   Towel Inversion/Eversion  -- Attempted-pt has difficulty sequencing    Other Seated Ankle Exercises  Seated resisted ankle eversion, 2 sets of 10 reps with green band.  PT provides assistance for technique    Other Seated Ankle Exercises  Active ROM seated ankle eversion, x 5 reps each, with tactile cues for avoiding compensation with external hip rotation             PT Education - 04/02/18 1219    Education Details  How to obtain rollator Meckes (PT to request order for rollator from Dr. Carles Collet)    Person(s) Educated  Patient;Spouse    Methods  Explanation;Demonstration;Tactile cues;Verbal cues;Handout    Comprehension  Verbalized understanding for instructions on obtaining Braaten          PT Long Term Goals - 03/20/18 1915      PT LONG TERM GOAL #1   Title  Pt will peform HEP with wife's supervision, for improved balance and gait.  TARGET 04/17/18    Time  4    Period  Weeks    Status  New    Target Date  04/17/18      PT LONG TERM GOAL #2   Title  Pt will ambulate at least 1000 ft, indoors and outdoors with supervision with appropriate assistive device, for improved gait in community.    Time  4    Period  Weeks    Status  New    Target Date  04/17/18      PT LONG TERM GOAL #3    Title  Pt will perform at least 8 of 10 reps of sit<>stand from surfaces lower than 18" height, no posterior lean for improved transfer safety and efficiency.    Time  4    Period  Weeks    Status  New    Target Date  04/17/18      PT  LONG TERM GOAL #4   Title  Pt/wife will verbalize understanding of fall prevention in home environment.     Time  4    Period  Weeks    Status  New    Target Date  04/17/18            Plan - 04/02/18 1220    Clinical Impression Statement  Skilled PT session focused on discussion regarding order for appropriate Sanko:  per wife-pt does not think he needs Mccormack, but he is agreeable to pursue rollator Lusher for community use if needed.  PT explained that rollator would be used to improve safety and stability with gait and acknowledged that patient will likely still need cueing for safety and pace with gait from wife.  Also worked on seated exercises to incorporate large stepping method into and out of car (wife reports improvement with this since last visit) and ankle exercises for stregnthening in eversion.  Pt has difficulty with following instructions for and sequencing technique of seated ankle exercises; therefore additional exercises not added to HEP.    Rehab Potential  Fair    PT Frequency  2x / week    PT Duration  4 weeks may reduce down to 1x/wk for 2 weeks     PT Treatment/Interventions  ADLs/Self Care Home Management;DME Instruction;Balance training;Therapeutic activities;Therapeutic exercise;Functional mobility training;Gait training;Neuromuscular re-education;Patient/family education    PT Next Visit Plan  PT to request order for rollator from Dr. Carles Collet, begin looking at [place] ; ankle exercises, low surface transfers, and turning technique    Consulted and Agree with Plan of Care  Patient;Family member/caregiver    Family Member Consulted  wife       Patient will benefit from skilled therapeutic intervention in order to improve the following  deficits and impairments:  Abnormal gait, Decreased balance, Decreased safety awareness, Decreased mobility, Difficulty walking, Decreased strength, Postural dysfunction  Visit Diagnosis: Unsteadiness on feet  Other abnormalities of gait and mobility     Problem List Patient Active Problem List   Diagnosis Date Noted  . Parkinson's disease ([place]) 10/17/2017  . 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[place]) 10/17/2017  . 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  . BPH with obstruction/lower urinary tract symptoms 07/09/2017  . 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 Acute urinary retention 11/23/2012  . BPH (benign prostatic hyperplasia) 11/23/2012    Mikylah Ackroyd W. 04/02/2018, 12:25 PM  Frazier Butt., PT   [facility] [address] [place], [place], [address] Phone: [phone]   Fax:  [phone]  Name: [place] MRN: [id number] Date of Birth: 12-Jun-1956

## 2018-04-03 ENCOUNTER — Telehealth: Payer: Self-pay | Admitting: Neurology

## 2018-04-03 DIAGNOSIS — G20 Parkinson's disease: Secondary | ICD-10-CM

## 2018-04-03 NOTE — Telephone Encounter (Signed)
Order entered

## 2018-04-03 NOTE — Progress Notes (Signed)
Okay to write RX Czaplicki/rollator

## 2018-04-03 NOTE — Telephone Encounter (Signed)
-----   Message from Frazier Butt, PT sent at 04/03/2018 11:55 AM EDT ----- Regarding: RE: Order for rollator Jade, Yes, I saw it now under the med list.  However, could you please enter it as a referral for DME?  It will need a doctor's signature as well.  Thank you.  Sorry for the extra step.  Thanks, Mady Haagensen, PT ----- Message ----- From: Annamaria Helling, CMA Sent: 04/03/2018   7:48 AM To: Frazier Butt, PT Subject: RE: Order for rollator                         It is under the med list. Do you want me to order as a referral for DME?  ----- Message ----- From: Frazier Butt, PT Sent: 04/03/2018   7:43 AM To: Annamaria Helling, CMA Subject: Order for rollator                             Luvenia Starch, I saw your telephone encounter noting that you placed the order for a rollator for [facility].  Thank you!  However, when I look under referrals, other orders, and media, I cannot seem to find the order.  Could you advise me where to look or make sure it is in there?    Thank you so much.  Mady Haagensen, PT

## 2018-04-07 ENCOUNTER — Other Ambulatory Visit: Payer: Self-pay | Admitting: Neurology

## 2018-04-13 ENCOUNTER — Ambulatory Visit: Payer: PPO | Admitting: Physical Therapy

## 2018-04-20 ENCOUNTER — Encounter: Payer: Self-pay | Admitting: Physical Therapy

## 2018-04-20 ENCOUNTER — Ambulatory Visit: Payer: PPO | Attending: Neurology | Admitting: Physical Therapy

## 2018-04-20 DIAGNOSIS — R293 Abnormal posture: Secondary | ICD-10-CM | POA: Diagnosis not present

## 2018-04-20 DIAGNOSIS — R2681 Unsteadiness on feet: Secondary | ICD-10-CM | POA: Diagnosis not present

## 2018-04-20 DIAGNOSIS — R29818 Other symptoms and signs involving the nervous system: Secondary | ICD-10-CM

## 2018-04-20 DIAGNOSIS — R2689 Other abnormalities of gait and mobility: Secondary | ICD-10-CM

## 2018-04-20 NOTE — Therapy (Signed)
[facility] [address] [place], [place], [address] Phone: [phone]   Fax:  [phone]  Physical Therapy Treatment  Patient Details  Name: [place] MRN: [id number] Date of Birth: August 26, 1956 Referring Provider: Alonza Bogus   Encounter Date: 04/20/2018  PT End of Session - 04/20/18 0905    Visit Number  6    Number of Visits  9    Date for PT Re-Evaluation  05/19/18    Authorization Type  [employer]-10th visit progress noted needed    PT Start Time  0802    PT Stop Time  0848    PT Time Calculation (min)  46 min    Activity Tolerance  Patient tolerated treatment well    Behavior During Therapy  [facility] for tasks assessed/performed       Past Medical History:  Diagnosis Date  . BPH (benign prostatic hypertrophy) with urinary retention   . Foley catheter in place   . Parkinson disease [facility])    neurologist--  dr Richardean Chimera  in [place]  . UTI (urinary tract infection)     Past Surgical History:  Procedure Laterality Date  . TRANSURETHRAL RESECTION OF PROSTATE N/A 11/23/2012   Procedure: TRANSURETHRAL RESECTION OF THE PROSTATE WITH GYRUS INSTRUMENTS;  Surgeon: Bernestine Amass, MD;  Location: [facility];  Service: Urology;  Laterality: N/A;  . TRANSURETHRAL RESECTION OF PROSTATE N/A 07/09/2017   Procedure: TRANSURETHRAL RESECTION OF THE PROSTATE (TURP), BIPOLAR;  Surgeon: Ceasar Mons, MD;  [facility];  Service: Urology;  Laterality: N/A;    There were no vitals filed for this visit.  Subjective Assessment - 04/20/18 0804    Subjective  Had to cancel last week's visit due to stomach virus.  Have had no falls.    Patient is accompained by:  Family member   wife   Patient Stated Goals  Per wife:  to work on getting out of a chair, to try to stop crossing feet when getting out of the car.    Currently in Pain?  No/denies    Pain Onset  More than a month ago                        [facility] Adult PT Treatment/Exercise - 04/20/18 0811      Transfers   Transfers  Sit to Stand;Stand to Sit    Sit to Stand  Without upper extremity assist;From chair/3-in-1;5: Supervision;From bed    Five time sit to stand comments   11.96    Stand to Sit  5: Supervision;4: Min guard;Without upper extremity assist;To chair/3-in-1;To bed    Number of Reps  10 reps;Other sets (comment)   from 22", 18", 16" surfaces   Transfer Cueing  Cues to widen BOS and about mid-way through 10 reps, cues for increased forward lean      Ambulation/Gait   Ambulation/Gait  Yes    Ambulation/Gait Assistance  4: Min guard;5: Supervision    Ambulation/Gait Assistance Details  PT provides cues for slowed pace, for heelstrike, for foot clearance; provides cues frequently, as pt reverts back to quick, short steps with decreased foot clearance    Ambulation Distance (Feet)  1000 Feet    Assistive device  Rollator    Gait Pattern  Step-through pattern;Step-to pattern;Decreased step length - right;Decreased step length - left;Scissoring;Narrow base of support;Poor foot clearance - right   RLE internal rotation   Gait velocity  13.5  sec rollator (2.43 ft/sec)   11.75 sec = 2.79 ft/sec   Ramp  5: Supervision   and cues for ramp negotiation with rollator   Ramp Details (indicate cue type and reason)  Outdoor ramp-cues for slowed pace, heelstrike, upright posture descending ramp, cue for foot clearance with ascending ramp      Standardized Balance Assessment   Standardized Balance Assessment  Timed Up and Go Test      Timed Up and Go Test   TUG  Normal TUG    Normal TUG (seconds)  7.29   9.28 no device; 19.44 sec rollator (cues to turn)     Self-Care   Self-Care  Other Self-Care Comments    Other Self-Care Comments   Printed orders for rollator/DME from Dr. Carles Collet and provided to patient/wife; discussed fall prevention in relation to home environment and to safey concerns with his  PD.      Exercises   Exercises  Other Exercises    Other Exercises   Reviewed seated PWR! Step, for improved car negotiation-pt able to perform with cues and supervision.             PT Education - 04/20/18 0904    Education Details  Fall prevention, provided written order from MD for rollator Reidel; discussed POC/goals/discharge this visit with plans for return PT eval in 6 months.    Person(s) Educated  Patient   wife present at end of session   Methods  Explanation;Handout    Comprehension  Verbalized understanding;Verbal cues required          PT Long Term Goals - 04/20/18 0806      PT LONG TERM GOAL #1   Title  Pt will peform HEP with wife's supervision, for improved balance and gait.  TARGET 04/17/18    Time  4    Period  Weeks    Status  Achieved      PT LONG TERM GOAL #2   Title  Pt will ambulate at least 1000 ft, indoors and outdoors with supervision with appropriate assistive device, for improved gait in community.    Time  4    Period  Weeks    Status  Achieved      PT LONG TERM GOAL #3   Title  Pt will perform at least 8 of 10 reps of sit<>stand from surfaces lower than 18" height, no posterior lean for improved transfer safety and efficiency.    Time  4    Period  Weeks    Status  Achieved      PT LONG TERM GOAL #4   Title  Pt/wife will verbalize understanding of fall prevention in home environment.     Time  4    Period  Weeks    Status  Achieved            Plan - 04/20/18 0906    Clinical Impression Statement  Assessed LTGs this visit, as today pt's last schedule visit and he is prepared for discharge.  He has met all LTGs-for HEP, transfers, gait with rollator and fall prevention education.  Pt has reported decreased falls over the course of physical therapy; however, he remains at fall risk because of decreased safety awareness, increased gait speed with decreased foot clearance, and tendency for increased lower extremity internal  rotation/scissoring with.  Have educated wife in how to cue patient for slowed gait and PT recommends use of rollator Cadieux for improved safety with gait.  Pt is  appropriate for discharge at this time.    Rehab Potential  Fair    PT Frequency  2x / week    PT Duration  4 weeks   may reduce down to 1x/wk for 2 weeks    PT Treatment/Interventions  ADLs/Self Care Home Management;DME Instruction;Balance training;Therapeutic activities;Therapeutic exercise;Functional mobility training;Gait training;Neuromuscular re-education;Patient/family education    PT Next Visit Plan  Discharge this visit; pt would benefit from return eval in 6 months    Consulted and Agree with Plan of Care  Patient;Family member/caregiver    Family Member Consulted  wife       Patient will benefit from skilled therapeutic intervention in order to improve the following deficits and impairments:  Abnormal gait, Decreased balance, Decreased safety awareness, Decreased mobility, Difficulty walking, Decreased strength, Postural dysfunction  Visit Diagnosis: Other abnormalities of gait and mobility  Unsteadiness on feet  Abnormal posture  Other symptoms and signs involving the nervous system     Problem List Patient Active Problem List   Diagnosis Date Noted  . Parkinson's disease ([place]) 10/17/2017  . 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[place]) 10/17/2017  . 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  . BPH with obstruction/lower urinary tract symptoms 07/09/2017  . 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 Acute urinary retention 11/23/2012  . BPH (benign prostatic hyperplasia) 11/23/2012    MARRIOTT,AMY W. 04/20/2018, 9:10 AM  Frazier Butt., PT   [facility] [address] [place], [place], [address] Phone: [phone]   Fax:  [phone]  Name: TAVARAS GOODY MRN: [id number] Date of Birth: August 25, 1956    PHYSICAL THERAPY DISCHARGE SUMMARY  Visits from Start of Care: 6  Current functional level related to goals / functional outcomes: PT Long Term Goals - 04/20/18 0806      PT LONG TERM GOAL #1   Title  Pt will peform HEP with wife's supervision, for improved balance and gait.  TARGET 04/17/18    Time  4    Period  Weeks    Status  Achieved      PT LONG TERM GOAL #2   Title  Pt will ambulate at least 1000 ft, indoors and outdoors with supervision with appropriate assistive device, for improved gait in community.    Time  4    Period  Weeks    Status  Achieved      PT LONG TERM GOAL #3   Title  Pt will perform at least 8 of 10 reps of sit<>stand from surfaces lower than 18" height, no posterior lean for improved transfer safety and efficiency.    Time  4    Period  Weeks    Status  Achieved      PT LONG TERM GOAL #4   Title  Pt/wife will verbalize understanding of fall prevention in home environment.     Time  4    Period  Weeks    Status  Achieved      Pt has met all LTGs   Remaining deficits: Fall risk due to decreased safety awareness, scissoring gait pattern, increased gait speed with decreased step length.   Education / Equipment: Educated in [employer], fall prevention, appropriate assistive device use  Plan: Patient agrees to discharge.  Patient goals were met. Patient is being discharged due to meeting the stated rehab goals.  ?????Pt would benefit from return eval in 6 months due to progressive nature of disease process.  Mady Haagensen, PT 04/20/18 9:12 AM Phone: [phone] Fax: [phone]

## 2018-04-29 ENCOUNTER — Other Ambulatory Visit: Payer: Self-pay | Admitting: Neurology

## 2018-04-30 DIAGNOSIS — D044 Carcinoma in situ of skin of scalp and neck: Secondary | ICD-10-CM | POA: Diagnosis not present

## 2018-05-12 ENCOUNTER — Telehealth: Payer: Self-pay | Admitting: Neurology

## 2018-05-12 NOTE — Telephone Encounter (Signed)
Received note from [facility] that after several attempts to contact patient they have been unable to reach him to schedule an appt. [phone].

## 2018-06-29 ENCOUNTER — Ambulatory Visit: Payer: PPO | Admitting: Neurology
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
## 2018-07-06 ENCOUNTER — Ambulatory Visit (INDEPENDENT_AMBULATORY_CARE_PROVIDER_SITE_OTHER): Payer: PPO | Admitting: Neurology

## 2018-07-06 ENCOUNTER — Encounter: Payer: Self-pay | Admitting: Neurology

## 2018-07-06 VITALS — BP 120/74 | HR 70 | Ht 69.0 in | Wt 163.0 lb

## 2018-07-06 DIAGNOSIS — F0280 Dementia in other diseases classified elsewhere without behavioral disturbance: Secondary | ICD-10-CM

## 2018-07-06 DIAGNOSIS — G20 Parkinson's disease: Secondary | ICD-10-CM

## 2018-07-06 DIAGNOSIS — G4710 Hypersomnia, unspecified: Secondary | ICD-10-CM

## 2018-07-06 MED ORDER — CARBIDOPA-LEVODOPA ER 25-100 MG PO TBCR: EXTENDED_RELEASE_TABLET | ORAL | 1 refills | Status: DC

## 2018-07-06 MED ORDER — ENTACAPONE 200 MG PO TABS: 200.0000 mg | ORAL_TABLET | Freq: Four times a day (QID) | ORAL | 1 refills | Status: DC

## 2018-07-06 NOTE — Patient Instructions (Signed)
You need to use your [place]!  Make an appointment with the sleep specialist at [facility] neurology.  Stop the carbidopa/levodopa 25/100 IR (yellow tablet)  Start carbidopa/levodopa 25/100 CR and take up to 9 tablets per day

## 2018-07-07 ENCOUNTER — Telehealth: Payer: Self-pay | Admitting: Neurology

## 2018-07-07 NOTE — Telephone Encounter (Signed)
Received note from [facility]: "Patient Adam Cordova 2056/05/24 had a referral for sleep medicine from July and we contacted him to set up appointment. We made 3 attempts. If patient would like the appointment, he needs to contact us."   Mychart message sent to patient.

## 2018-07-07 NOTE — Telephone Encounter (Signed)
Referral resent to [facility] for sleep medicine consult to [phone] with confirmation received. They will contact him to schedule.

## 2018-07-24 ENCOUNTER — Other Ambulatory Visit (INDEPENDENT_AMBULATORY_CARE_PROVIDER_SITE_OTHER): Payer: PPO

## 2018-07-24 DIAGNOSIS — Z5181 Encounter for therapeutic drug level monitoring: Secondary | ICD-10-CM

## 2018-07-24 DIAGNOSIS — R413 Other amnesia: Secondary | ICD-10-CM

## 2018-07-24 DIAGNOSIS — G20 Parkinson's disease: Secondary | ICD-10-CM

## 2018-07-25 LAB — COMPREHENSIVE METABOLIC PANEL
AG RATIO: 1.8 (calc) (ref 1.0–2.5)
ALBUMIN MSPROF: 4 g/dL (ref 3.6–5.1)
ALT: 4 U/L — ABNORMAL LOW (ref 9–46)
AST: 12 U/L (ref 10–35)
Alkaline phosphatase (APISO): 57 U/L (ref 40–115)
BILIRUBIN TOTAL: 0.6 mg/dL (ref 0.2–1.2)
BUN: 19 mg/dL (ref 7–25)
CALCIUM: 8.6 mg/dL (ref 8.6–10.3)
CO2: 28 mmol/L (ref 20–32)
Chloride: 107 mmol/L (ref 98–110)
Creat: 1.09 mg/dL (ref 0.70–1.25)
Globulin: 2.2 g/dL (calc) (ref 1.9–3.7)
Glucose, Bld: 100 mg/dL — ABNORMAL HIGH (ref 65–99)
POTASSIUM: 4.5 mmol/L (ref 3.5–5.3)
Sodium: 139 mmol/L (ref 135–146)
Total Protein: 6.2 g/dL (ref 6.1–8.1)

## 2018-07-25 LAB — CBC WITH DIFFERENTIAL/PLATELET
BASOS PCT: 0.7 %
Basophils Absolute: 53 cells/uL (ref 0–200)
Eosinophils Absolute: 91 cells/uL (ref 15–500)
Eosinophils Relative: 1.2 %
HCT: 42.1 % (ref 38.5–50.0)
Hemoglobin: 13.6 g/dL (ref 13.2–17.1)
Lymphs Abs: 1710 cells/uL (ref 850–3900)
MCH: 27 pg (ref 27.0–33.0)
MCHC: 32.3 g/dL (ref 32.0–36.0)
MCV: 83.5 fL (ref 80.0–100.0)
MONOS PCT: 9.4 %
MPV: 9.1 fL (ref 7.5–12.5)
NEUTROS ABS: 5031 {cells}/uL (ref 1500–7800)
Neutrophils Relative %: 66.2 %
PLATELETS: 253 10*3/uL (ref 140–400)
RBC: 5.04 10*6/uL (ref 4.20–5.80)
RDW: 14.1 % (ref 11.0–15.0)
Total Lymphocyte: 22.5 %
WBC: 7.6 10*3/uL (ref 3.8–10.8)
WBCMIX: 714 {cells}/uL (ref 200–950)

## 2018-07-25 LAB — TSH: TSH: 0.49 mIU/L (ref 0.40–4.50)

## 2018-07-27 ENCOUNTER — Telehealth: Payer: Self-pay | Admitting: Neurology

## 2018-07-27 ENCOUNTER — Other Ambulatory Visit: Payer: Self-pay | Admitting: Neurology

## 2018-07-27 NOTE — Telephone Encounter (Signed)
-----   Message from [place], DO sent at 07/27/2018  7:25 AM EST ----- Labs look okay although I did not get a UA.  Did you order?

## 2018-08-13 DIAGNOSIS — N401 Enlarged prostate with lower urinary tract symptoms: Secondary | ICD-10-CM | POA: Diagnosis not present

## 2018-08-20 DIAGNOSIS — N401 Enlarged prostate with lower urinary tract symptoms: Secondary | ICD-10-CM | POA: Diagnosis not present

## 2018-08-20 DIAGNOSIS — N3020 Other chronic cystitis without hematuria: Secondary | ICD-10-CM | POA: Diagnosis not present

## 2018-08-20 DIAGNOSIS — R3914 Feeling of incomplete bladder emptying: Secondary | ICD-10-CM | POA: Diagnosis not present

## 2018-08-24 MED ORDER — CARBIDOPA-LEVODOPA ER 25-100 MG PO TBCR: EXTENDED_RELEASE_TABLET | ORAL | 1 refills | Status: DC

## 2018-09-10 ENCOUNTER — Telehealth: Payer: Self-pay | Admitting: Physical Therapy

## 2018-09-10 ENCOUNTER — Other Ambulatory Visit: Payer: Self-pay | Admitting: Neurology

## 2018-09-10 DIAGNOSIS — G20 Parkinson's disease: Secondary | ICD-10-CM

## 2018-09-10 NOTE — Telephone Encounter (Signed)
Dr. Carles Collet, I see that Adam Cordova is scheduled for 94-month follow-up evaluation for PT in February, which I recommended upon his previous discharge.  If you agree, could you please write eval for PT via Epic?  Thank you.  Mady Haagensen, PT Mady Haagensen, PT 09/10/18 11:34 AM Phone: [phone] Fax: [phone]

## 2018-09-10 NOTE — Telephone Encounter (Signed)
Order entered

## 2018-09-12 ENCOUNTER — Encounter (HOSPITAL_COMMUNITY): Payer: Self-pay | Admitting: Emergency Medicine

## 2018-09-12 ENCOUNTER — Emergency Department (HOSPITAL_COMMUNITY)
Admission: EM | Admit: 2018-09-12 | Discharge: 2018-09-12 | Disposition: A | Discharge status: Home / Self Care | Payer: PPO | Attending: Emergency Medicine | Admitting: Emergency Medicine

## 2018-09-12 ENCOUNTER — Emergency Department (HOSPITAL_COMMUNITY): Payer: PPO

## 2018-09-12 DIAGNOSIS — Z79899 Other long term (current) drug therapy: Secondary | ICD-10-CM | POA: Insufficient documentation

## 2018-09-12 DIAGNOSIS — S6992XA Unspecified injury of left wrist, hand and finger(s), initial encounter: Secondary | ICD-10-CM | POA: Diagnosis present

## 2018-09-12 DIAGNOSIS — F0280 Dementia in other diseases classified elsewhere without behavioral disturbance: Secondary | ICD-10-CM | POA: Insufficient documentation

## 2018-09-12 DIAGNOSIS — Y929 Unspecified place or not applicable: Secondary | ICD-10-CM | POA: Diagnosis not present

## 2018-09-12 DIAGNOSIS — Y9389 Activity, other specified: Secondary | ICD-10-CM | POA: Insufficient documentation

## 2018-09-12 DIAGNOSIS — S62325A Displaced fracture of shaft of fourth metacarpal bone, left hand, initial encounter for closed fracture: Secondary | ICD-10-CM | POA: Diagnosis not present

## 2018-09-12 DIAGNOSIS — W1839XA Other fall on same level, initial encounter: Secondary | ICD-10-CM | POA: Insufficient documentation

## 2018-09-12 DIAGNOSIS — G20 Parkinson's disease: Secondary | ICD-10-CM | POA: Insufficient documentation

## 2018-09-12 DIAGNOSIS — M79642 Pain in left hand: Secondary | ICD-10-CM | POA: Diagnosis not present

## 2018-09-12 DIAGNOSIS — Y999 Unspecified external cause status: Secondary | ICD-10-CM | POA: Diagnosis not present

## 2018-09-12 DIAGNOSIS — W010XXA Fall on same level from slipping, tripping and stumbling without subsequent striking against object, initial encounter: Secondary | ICD-10-CM

## 2018-09-12 MED ORDER — ACETAMINOPHEN 500 MG PO TABS
1000.0000 mg | ORAL_TABLET | Freq: Once | ORAL | Status: AC
  Administered 2018-09-12: 1000 mg via ORAL
  Filled 2018-09-12: qty 2

## 2018-09-12 NOTE — ED Provider Notes (Signed)
[facility] DEPT Provider Note   CSN: [id number] Arrival date & time: 09/12/18  1011     History   Chief Complaint Chief Complaint  Patient presents with  . Fall  . Hand Pain    HPI Adam Cordova is a 63 y.o. male.  Patient c/o mechanical fall with accidental injury to left hand this AM. Pt has hx Parkinsons which causes him some instability. Today was getting dressed, and leaned too far to left, and fell onto outstretched left hand. Pain to left hand - constant, dull, moderate, non radiating, worse w palpation. Skin intact. Denies any other pain or injury. No loc or faintness/dizziness. No headache. No neck or back pain.   The history is provided by the patient.  Fall  Pertinent negatives include no headaches.  Hand Pain  Pertinent negatives include no headaches.    Past Medical History:  Diagnosis Date  . BPH (benign prostatic hypertrophy) with urinary retention   . Foley catheter in place   . Parkinson disease [facility])    neurologist--  dr Richardean Chimera  in [place]  . UTI (urinary tract infection)     Patient Active Problem List   Diagnosis Date Noted  . Parkinson's disease ([place]) 10/17/2017  . 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[place]) 10/17/2017  . 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  . BPH with obstruction/lower urinary tract symptoms 07/09/2017  . 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 Acute urinary retention 11/23/2012  . BPH (benign prostatic hyperplasia) 11/23/2012    Past Surgical History:  Procedure Laterality Date  . TRANSURETHRAL RESECTION OF PROSTATE N/A 11/23/2012   Procedure: TRANSURETHRAL RESECTION OF THE PROSTATE WITH GYRUS INSTRUMENTS;  Surgeon: Bernestine Amass, MD;  Location: [facility];  Service: Urology;  Laterality: N/A;  . TRANSURETHRAL RESECTION OF PROSTATE N/A 07/09/2017   Procedure: TRANSURETHRAL RESECTION OF THE PROSTATE (TURP), BIPOLAR;   Surgeon: Ceasar Mons, MD;  Location: [facility];  Service: Urology;  Laterality: N/A;        Home Medications    Prior to Admission medications   Medication Sig Start Date End Date Taking? Authorizing Provider  Carbidopa-Levodopa ER (SINEMET CR) 25-100 MG tablet controlled release Take 9 tablets per day as directed Patient taking differently: Take 2-3 tablets by mouth See admin instructions. 3 tablets at 7 am,  2 tablets at 10 am, 2 tablets at 2 pm, 2 tablets at 6 pm, 08/24/18  Yes Tat, Rebecca S, DO  donepezil (ARICEPT) 10 MG tablet TAKE 1 TABLET BY MOUTH EVERYDAY AT BEDTIME Patient taking differently: Take 10 mg by mouth at bedtime.  07/27/18  Yes Tat, Eustace Quail, DO  entacapone (COMTAN) 200 MG tablet TAKE ONE TABLET BY MOUTH FOUR TIMES DAILY  Patient taking differently: Take 200 mg by mouth 4 (four) times daily.  09/11/18  Yes Tat, Eustace Quail, DO  naproxen sodium (ALEVE) 220 MG tablet Take 440 mg by mouth 2 (two) times daily as needed (for pain.).   Yes [provider]  Tamsulosin HCl (FLOMAX) 0.4 MG CAPS Take 0.4 mg by mouth 2 (two) times daily.    Yes [provider]  Levodopa (INBRIJA) 42 MG CAPS Place 42 mg into inhaler and inhale as directed. Patient not taking: Reported on 07/06/2018 02/23/18   Tat, Eustace Quail, DO    Family History Family History  Problem Relation Age of Onset  . Dementia Mother   . Neuropathy Mother   . Heart disease  Father   . Heart disease Brother     Social History Social History   Tobacco Use  . Smoking status: Never Smoker  . Smokeless tobacco: Never Used  Substance Use Topics  . Alcohol use: Yes    Comment: 7-10 glasses of wine a week  . Drug use: No     Allergies   Bee venom; Levaquin [levofloxacin hemihydrate]; and Sertraline   Review of Systems Review of Systems  Constitutional: Negative for fever.  Musculoskeletal: Negative for back pain and neck pain.  Skin: Negative for wound.  Neurological: Negative for  numbness and headaches.     Physical Exam Updated Vital Signs BP (!) 154/93 (BP Location: Right Arm)   Pulse 78   Temp 98.7 F (37.1 C) (Oral)   Resp 16   Ht 1.753 m (5\' 9" )   Wt 72.6 kg   SpO2 99%   BMI 23.63 kg/m   Physical Exam Vitals signs and nursing note reviewed.  Constitutional:      Appearance: Normal appearance. He is well-developed.  HENT:     Head: Atraumatic.     Nose: Nose normal.     Mouth/Throat:     Mouth: Mucous membranes are moist.  Eyes:     General: No scleral icterus.    Conjunctiva/sclera: Conjunctivae normal.     Pupils: Pupils are equal, round, and reactive to light.  Neck:     Musculoskeletal: Normal range of motion and neck supple. No neck rigidity.     Trachea: No tracheal deviation.  Cardiovascular:     Rate and Rhythm: Normal rate.     Pulses: Normal pulses.  Pulmonary:     Effort: Pulmonary effort is normal. No accessory muscle usage or respiratory distress.  Abdominal:     General: There is no distension.     Tenderness: There is no abdominal tenderness.  Musculoskeletal:        General: No swelling.     Comments: CTLS spine, non tender, aligned, no step off. Tenderness left 4th metacarpal. No other focal bony tenderness on extremity exam. Skin intact. Normal cap refill distally in digits.   Skin:    General: Skin is warm and dry.     Findings: No rash.  Neurological:     Mental Status: He is alert.     Comments: Alert, speech clear. Ambulates back and forth to xray.   Psychiatric:        Mood and Affect: Mood normal.      ED Treatments / Results  Labs (all labs ordered are listed, but only abnormal results are displayed) Labs Reviewed - No data to display  EKG None  Radiology Dg Hand Complete Left  Result Date: 09/12/2018 CLINICAL DATA:  Left hand pain after fall today. EXAM: LEFT HAND - COMPLETE 3+ VIEW COMPARISON:  None. FINDINGS: Mildly displaced and probably comminuted fracture is seen involving the fourth  metacarpal. No other bony abnormality is noted. Joint spaces are intact. No soft tissue abnormality is noted. IMPRESSION: Mildly displaced and probably comminuted fourth metacarpal fracture. Electronically Signed   By: Marijo Conception, M.D.   On: 09/12/2018 10:44    Procedures Procedures (including critical care time)  Medications Ordered in ED Medications  acetaminophen (TYLENOL) tablet 1,000 mg (has no administration in time range)     Initial Impression / Assessment and Plan / ED Course  I have reviewed the triage vital signs and the nursing notes.  Pertinent labs & imaging results that were available  during my care of the patient were reviewed by me and considered in my medical decision making (see chart for details).  Xrays.  Reviewed nursing notes and prior charts for additional history.   xrays reviewed - 4th mc fx. Discussed w pt/spouse.   Ulnar gutter splint. Acetaminophen po.  Pt appears stable for d/c.     Final Clinical Impressions(s) / ED Diagnoses   Final diagnoses:  None    ED Discharge Orders    None       Lajean Saver, MD 09/12/18 1057

## 2018-09-12 NOTE — Discharge Instructions (Signed)
It was our pleasure to provide your ER care today - we hope that you feel better.  Wear splint, elevate hand.   Take acetaminophen and/or ibuprofen as need for pain.  Follow up with hand specialist in the coming week -  call office Monday AM to arrange appointment.   Your blood pressure is high today - follow up with primary care doctor.   Return to ER if worse, new symptoms, new or severe pain, other concern.

## 2018-09-12 NOTE — ED Triage Notes (Signed)
Pt reports has Parkinson's and fell today hurting left fingers.

## 2018-09-16 DIAGNOSIS — S62325A Displaced fracture of shaft of fourth metacarpal bone, left hand, initial encounter for closed fracture: Secondary | ICD-10-CM | POA: Diagnosis not present

## 2018-09-16 DIAGNOSIS — M79645 Pain in left finger(s): Secondary | ICD-10-CM | POA: Diagnosis not present

## 2018-09-29 DIAGNOSIS — H52223 Regular astigmatism, bilateral: Secondary | ICD-10-CM | POA: Diagnosis not present

## 2018-09-29 DIAGNOSIS — H5213 Myopia, bilateral: Secondary | ICD-10-CM | POA: Diagnosis not present

## 2018-09-30 DIAGNOSIS — S62325A Displaced fracture of shaft of fourth metacarpal bone, left hand, initial encounter for closed fracture: Secondary | ICD-10-CM | POA: Diagnosis not present

## 2018-10-02 DIAGNOSIS — Z85828 Personal history of other malignant neoplasm of skin: Secondary | ICD-10-CM | POA: Diagnosis not present

## 2018-10-02 DIAGNOSIS — D485 Neoplasm of uncertain behavior of skin: Secondary | ICD-10-CM | POA: Diagnosis not present

## 2018-10-02 DIAGNOSIS — L570 Actinic keratosis: Secondary | ICD-10-CM | POA: Diagnosis not present

## 2018-10-02 DIAGNOSIS — C44319 Basal cell carcinoma of skin of other parts of face: Secondary | ICD-10-CM | POA: Diagnosis not present

## 2018-10-02 DIAGNOSIS — Z23 Encounter for immunization: Secondary | ICD-10-CM | POA: Diagnosis not present

## 2018-10-02 DIAGNOSIS — Z86008 Personal history of in-situ neoplasm of other site: Secondary | ICD-10-CM | POA: Diagnosis not present

## 2018-10-02 DIAGNOSIS — D225 Melanocytic nevi of trunk: Secondary | ICD-10-CM | POA: Diagnosis not present

## 2018-10-02 DIAGNOSIS — C4441 Basal cell carcinoma of skin of scalp and neck: Secondary | ICD-10-CM | POA: Diagnosis not present

## 2018-10-14 ENCOUNTER — Ambulatory Visit: Payer: PPO | Admitting: Physical Therapy

## 2018-10-14 DIAGNOSIS — S62325A Displaced fracture of shaft of fourth metacarpal bone, left hand, initial encounter for closed fracture: Secondary | ICD-10-CM | POA: Diagnosis not present

## 2018-11-10 DIAGNOSIS — S62325D Displaced fracture of shaft of fourth metacarpal bone, left hand, subsequent encounter for fracture with routine healing: Secondary | ICD-10-CM | POA: Diagnosis not present

## 2018-11-11 DIAGNOSIS — C44319 Basal cell carcinoma of skin of other parts of face: Secondary | ICD-10-CM | POA: Diagnosis not present

## 2018-11-23 ENCOUNTER — Ambulatory Visit: Payer: PPO | Admitting: Neurology

## 2018-12-10 NOTE — Progress Notes (Signed)
Virtual Visit via Video Note The purpose of this virtual visit is to provide medical care while limiting exposure to the novel coronavirus.    Consent was obtained for video visit:  Yes.   Answered questions that patient had about telehealth interaction:  Yes.   I discussed the limitations, risks, security and privacy concerns of performing an evaluation and management service by telemedicine. I also discussed with the patient that there may be a patient responsible charge related to this service. The patient expressed understanding and agreed to proceed.  Pt location: Home Physician Location: office Name of referring provider:  [name], MD I connected with Adam Cordova at patients initiation/request on 12/15/2018 at 10:30 AM EDT by video enabled telemedicine application and verified that I am speaking with the correct person using two identifiers. Pt MRN:  [id number] Pt DOB:  November 11, 1955 Video Participants:  Adam Cordova;  wife   History of Present Illness:  Patient seen in follow-up today with his wife who supplements the history.  Last visit, I discontinued his immediate release levodopa and started carbidopa/levodopa 25/100 CR, 2 tablets at 7 AM/10 AM/2 PM/6 PM and 1 tablet at bedtime, each dose with entacapone.  I changed this because of daytime hypersomnolence, although I was not sure that would make a profound difference.  They state today that he is still pretty sleepy.  I have referred him to the [facility] sleep center, but they made 3 attempts to contact the patient and they never contacted him back.  Records have been reviewed.  He was in the emergency room on January living, 2020 after a fall.  He was getting dressed and leaned too far to the left and fell onto his hand.  He ended up with a fourth metacarpal fracture.  He was placed in a splint.  His wife emailed me at the end of last week to state that hallucinations are becoming frequent and delusions bothersome.  He was insisting  that his wife was going to serve him with divorce papers.  He admits that he has a delusion that wife is cheating on him but he insists he knows its not real (wife denies).  He states "I have twins."  Wife states that delusions have been causing sleep issues and wandering at night.  Patient denies this.  Mostly this has increased over the last few weeks.  Pt states that "its like watching a movie - the people in it seem real.  The first few times it seemed real but now that it repeats itself it is less scary."   Observations/Objective:   There were no vitals filed for this visit. GEN:  The patient appears stated age and is in NAD.  Neurological examination:  Orientation: The patient is alert and oriented x3. Cranial nerves: There is good facial symmetry. 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  The speech is fluent and clear. Soft palate rises symmetrically and there is no tongue deviation. Hearing is intact to conversational tone. Motor: Strength is at least antigravity x 4.   Shoulder shrug is equal and symmetric.  There is no pronator drift.  Movement examination: Tone: unable Abnormal movements: none Coordination: The patient is somewhat apraxic when asked to perform these commands.  They are all very slow, but not necessarily decremation. Gait and Station: The patient pushes off of the chair to arise.  He has decreased arm swing on the left.  He is forward flexed.  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  Assessment and Plan:   1.  Parkinsons disease             -I had a long talk with the patient today.  He recently went for another opinion at [facility] and they felt that he had Lewy body dementia.  I have taken a significant amount of time reviewing his past records (over 40 minutes) and I do not see that this case.  Records indicate a clear early response to levodopa in 2015.  Records do not indicate any early atypical symptoms within the first 3 years of onset of disease.  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             -Continue carbidopa/levodopa 25/100 CR, up to 9 tablets/day.             -Continue entacapone with each levodopa dosage.       2.  EDS             -MSLT demonstrated pathologic sleepiness and concern for narcolepy.  He didn't meet clinical criteria but would recommend treat as such.  Didn't tolerate provigil 200 mg.    Had previously referred to [facility] sleep center, but patient/wife did not answer phone and multiple messages were left by [facility] to make an appointment.  3.  Nocturia             -had appt with Dr. Gilford Rile and had TURP and it really helped.  4.  Parkinsons dementia             -This has progressed over the course of time.  He is now having hallucinations and delusions.  We talked about various treatments.  We discussed quetiapine versus nuplazid.  We did talk about the fact that the atypical antipsychotic medications like quetiapine are not indicated for dementia related psychosis and increased risk of mortality in the elderly, usually because of infectious or  cardiac related. Understanding is expressed and they were agreeable that the benefits outweigh the risks in this case, and are more desirable the nuplazid right now because of the need for it to work quickly (nuplazid can take up to 6 weeks to show benefit) and they cannot come into the office for samples (due to covid), which would delay treatment.  We will start with seroquel 12.5 mg at bedtime for 1 week and then if tolerated, he will take 12.5 mg twice per day.  We may need to increase dosage further.  If so, I will get an EKG (again, hard to get during pandemic when they are homebound)  -We will do labs just to make sure he does not have an acute urinary tract infection or something else infectious wise going on.  He will have a urinalysis, chemistry, CBC   5.  cough             -Needs to follow-up with primary care.  This does not sound aspiration  related.  It is unrelated to eating times.  Wife asked me about this again today.  It is now almost exclusively when he lays down at nighttime.  It sounds more related to possible reflux.  Again encouraged him to follow-up with primary care.  Follow Up Instructions:    -I discussed the assessment and treatment plan with the patient. The patient was provided an opportunity to ask questions and all were answered. The patient agreed with the plan and demonstrated an understanding of the  instructions.   The patient was advised to call back or seek an in-person evaluation if the symptoms worsen or if the condition fails to improve as anticipated.    Total Time spent in visit with the patient was:  25 min, of which more than 50% of the time was spent in counseling and/or coordinating care on safety with med (black box) and safety in home.   Pt understands and agrees with the plan of care outlined.     Alonza Bogus, DO

## 2018-12-15 ENCOUNTER — Encounter: Payer: Self-pay | Admitting: Neurology

## 2018-12-15 ENCOUNTER — Other Ambulatory Visit: Payer: Self-pay

## 2018-12-15 ENCOUNTER — Telehealth: Payer: Self-pay

## 2018-12-15 ENCOUNTER — Telehealth (INDEPENDENT_AMBULATORY_CARE_PROVIDER_SITE_OTHER): Payer: PPO | Admitting: Neurology

## 2018-12-15 DIAGNOSIS — R413 Other amnesia: Secondary | ICD-10-CM

## 2018-12-15 DIAGNOSIS — F02818 Dementia in other diseases classified elsewhere, unspecified severity, with other behavioral disturbance: Secondary | ICD-10-CM

## 2018-12-15 DIAGNOSIS — G20 Parkinson's disease: Secondary | ICD-10-CM

## 2018-12-15 DIAGNOSIS — F0281 Dementia in other diseases classified elsewhere with behavioral disturbance: Secondary | ICD-10-CM

## 2018-12-15 DIAGNOSIS — R441 Visual hallucinations: Secondary | ICD-10-CM

## 2018-12-15 DIAGNOSIS — F0280 Dementia in other diseases classified elsewhere without behavioral disturbance: Secondary | ICD-10-CM

## 2018-12-15 MED ORDER — QUETIAPINE FUMARATE 25 MG PO TABS: ORAL_TABLET | ORAL | 1 refills | Status: DC

## 2018-12-15 NOTE — Telephone Encounter (Signed)
Orders placed for CBC, UA and CHEM.  Spoke with pt's wife stating that Dr. Carles Collet would like pt to proceed with lab work.  Wife states that she will bring pt in tomorrow for collection.

## 2018-12-16 ENCOUNTER — Other Ambulatory Visit (INDEPENDENT_AMBULATORY_CARE_PROVIDER_SITE_OTHER): Payer: PPO

## 2018-12-16 DIAGNOSIS — R413 Other amnesia: Secondary | ICD-10-CM | POA: Diagnosis not present

## 2018-12-16 DIAGNOSIS — Z1283 Encounter for screening for malignant neoplasm of skin: Secondary | ICD-10-CM | POA: Diagnosis not present

## 2018-12-16 DIAGNOSIS — R441 Visual hallucinations: Secondary | ICD-10-CM

## 2018-12-16 DIAGNOSIS — L821 Other seborrheic keratosis: Secondary | ICD-10-CM | POA: Diagnosis not present

## 2018-12-16 DIAGNOSIS — F0281 Dementia in other diseases classified elsewhere with behavioral disturbance: Secondary | ICD-10-CM

## 2018-12-16 DIAGNOSIS — F0280 Dementia in other diseases classified elsewhere without behavioral disturbance: Secondary | ICD-10-CM | POA: Diagnosis not present

## 2018-12-16 DIAGNOSIS — G20 Parkinson's disease: Secondary | ICD-10-CM

## 2018-12-16 DIAGNOSIS — I10 Essential (primary) hypertension: Secondary | ICD-10-CM | POA: Diagnosis not present

## 2018-12-16 DIAGNOSIS — L820 Inflamed seborrheic keratosis: Secondary | ICD-10-CM | POA: Diagnosis not present

## 2018-12-16 DIAGNOSIS — F02818 Dementia in other diseases classified elsewhere, unspecified severity, with other behavioral disturbance: Secondary | ICD-10-CM

## 2018-12-16 DIAGNOSIS — Z1231 Encounter for screening mammogram for malignant neoplasm of breast: Secondary | ICD-10-CM | POA: Diagnosis not present

## 2018-12-16 DIAGNOSIS — M48062 Spinal stenosis, lumbar region with neurogenic claudication: Secondary | ICD-10-CM | POA: Diagnosis not present

## 2018-12-16 DIAGNOSIS — Z6830 Body mass index (BMI) 30.0-30.9, adult: Secondary | ICD-10-CM | POA: Diagnosis not present

## 2018-12-16 DIAGNOSIS — Z23 Encounter for immunization: Secondary | ICD-10-CM | POA: Diagnosis not present

## 2018-12-17 ENCOUNTER — Telehealth: Payer: Self-pay | Admitting: *Deleted

## 2018-12-17 ENCOUNTER — Encounter: Payer: Self-pay | Admitting: *Deleted

## 2018-12-17 LAB — UA/M W/RFLX CULTURE, COMP
Bilirubin, UA: NEGATIVE
Glucose, UA: NEGATIVE
Ketones, UA: NEGATIVE
Leukocytes,UA: NEGATIVE
Nitrite, UA: NEGATIVE
Protein,UA: NEGATIVE
RBC, UA: NEGATIVE
Specific Gravity, UA: 1.018 (ref 1.005–1.030)
Urobilinogen, Ur: 0.2 mg/dL (ref 0.2–1.0)
pH, UA: 5.5 (ref 5.0–7.5)

## 2018-12-17 LAB — COMPREHENSIVE METABOLIC PANEL
AG Ratio: 1.8 (calc) (ref 1.0–2.5)
ALT: 6 U/L — ABNORMAL LOW (ref 9–46)
AST: 13 U/L (ref 10–35)
Albumin: 4.2 g/dL (ref 3.6–5.1)
Alkaline phosphatase (APISO): 57 U/L (ref 35–144)
BUN: 17 mg/dL (ref 7–25)
CO2: 28 mmol/L (ref 20–32)
Calcium: 9.4 mg/dL (ref 8.6–10.3)
Chloride: 105 mmol/L (ref 98–110)
Creat: 1.06 mg/dL (ref 0.70–1.25)
Globulin: 2.3 g/dL (calc) (ref 1.9–3.7)
Glucose, Bld: 98 mg/dL (ref 65–99)
Potassium: 4.5 mmol/L (ref 3.5–5.3)
Sodium: 140 mmol/L (ref 135–146)
Total Bilirubin: 0.8 mg/dL (ref 0.2–1.2)
Total Protein: 6.5 g/dL (ref 6.1–8.1)

## 2018-12-17 LAB — CBC WITH DIFFERENTIAL/PLATELET
Absolute Monocytes: 806 cells/uL (ref 200–950)
Basophils Absolute: 48 cells/uL (ref 0–200)
Basophils Relative: 0.5 %
Eosinophils Absolute: 106 cells/uL (ref 15–500)
Eosinophils Relative: 1.1 %
HCT: 46.5 % (ref 38.5–50.0)
Hemoglobin: 14.8 g/dL (ref 13.2–17.1)
Lymphs Abs: 1517 cells/uL (ref 850–3900)
MCH: 26.6 pg — ABNORMAL LOW (ref 27.0–33.0)
MCHC: 31.8 g/dL — ABNORMAL LOW (ref 32.0–36.0)
MCV: 83.6 fL (ref 80.0–100.0)
MPV: 9 fL (ref 7.5–12.5)
Monocytes Relative: 8.4 %
Neutro Abs: 7123 cells/uL (ref 1500–7800)
Neutrophils Relative %: 74.2 %
Platelets: 268 10*3/uL (ref 140–400)
RBC: 5.56 10*6/uL (ref 4.20–5.80)
RDW: 13.5 % (ref 11.0–15.0)
Total Lymphocyte: 15.8 %
WBC: 9.6 10*3/uL (ref 3.8–10.8)

## 2018-12-17 LAB — MICROSCOPIC EXAMINATION
Bacteria, UA: NONE SEEN
Casts: NONE SEEN /lpf
Epithelial Cells (non renal): NONE SEEN /hpf (ref 0–10)

## 2018-12-17 NOTE — Telephone Encounter (Signed)
Normal lab results sent via My Chart.

## 2018-12-17 NOTE — Telephone Encounter (Signed)
-----   Message from Shickley, DO sent at 12/17/2018  8:07 AM EDT ----- I have reviewed all lab results which are normal or stable. Please inform the patient.

## 2019-01-14 ENCOUNTER — Other Ambulatory Visit: Payer: Self-pay | Admitting: Neurology

## 2019-01-26 NOTE — Progress Notes (Signed)
Virtual Visit via Video Note The purpose of this virtual visit is to provide medical care while limiting exposure to the novel coronavirus.    Consent was obtained for video visit:  Yes.   Answered questions that patient had about telehealth interaction:  Yes.   I discussed the limitations, risks, security and privacy concerns of performing an evaluation and management service by telemedicine. I also discussed with the patient that there may be a patient responsible charge related to this service. The patient expressed understanding and agreed to proceed.  Pt location: Home Physician Location: office Name of referring provider:  [name], MD I connected with Adam Cordova at patients initiation/request on 01/28/2019 at  3:00 PM EDT by video enabled telemedicine application and verified that I am speaking with the correct person using two identifiers. Pt MRN:  [id number] Pt DOB:  [birth year] Video Participants:  Adam Cordova;  This patient is accompanied in the office by his spouse who supplements the history.   History of Present Illness:  Patient seen today in follow-up for Parkinson's disease.  Patient is on carbidopa/levodopa 25/100 CR and takes up to 9 tablets/day.  He is on entacapone.  We started quetiapine last visit at very low dosage and he is currently on 12.5 mg twice per day.  He is dreaming less but still with hallucinations of ants in the day.  He is very sleepy but was sleepy before starting this med.  He is very awake today.  Wife also mentions that the patient complains that his left foot is very painful when he awakens in the morning and "turns" but does better after he gets his medication and walks on it.  Patient's wife also states that she is getting very tired and having trouble caregiving for him as the sole caregiver.  She asks about resources.   Current Outpatient Medications on File Prior to Visit  Medication Sig Dispense Refill  . Carbidopa-Levodopa ER  (SINEMET CR) 25-100 MG tablet controlled release Take 9 tablets per day as directed (Patient taking differently: Take 2-3 tablets by mouth See admin instructions. 3 tablets at 7 am,  2 tablets at 10 am, 2 tablets at 2 pm, 2 tablets at 6 pm,) 810 tablet 1  . donepezil (ARICEPT) 10 MG tablet TAKE 1 TABLET BY MOUTH EVERYDAY AT BEDTIME 90 tablet 1  . entacapone (COMTAN) 200 MG tablet TAKE ONE TABLET BY MOUTH FOUR TIMES DAILY  (Patient taking differently: Take 200 mg by mouth 4 (four) times daily. ) 120 tablet 5  . naproxen sodium (ALEVE) 220 MG tablet Take 440 mg by mouth 2 (two) times daily as needed (for pain.).    Marland Kitchen QUEtiapine (SEROQUEL) 25 MG tablet 1/2 tablet q hs x 1 week, then 1/2 tablet bid 90 tablet 1  . Tamsulosin HCl (FLOMAX) 0.4 MG CAPS Take 0.4 mg by mouth 2 (two) times daily.      No current facility-administered medications on file prior to visit.      Observations/Objective:   There were no vitals filed for this visit. GEN:  The patient appears stated age and is in NAD.  Neurological examination:  Orientation: The patient is alert and oriented x3. Cranial nerves: There is good facial symmetry. There is significant facial hypomimia.  The speech is fluent and clear. Soft palate rises symmetrically and there is no tongue deviation. Hearing is intact to conversational tone. Motor: Strength is at least antigravity x 4.   Shoulder shrug is equal and  symmetric.  There is no pronator drift.  Movement examination: Tone: unable Abnormal movements: None Coordination:  There is slowness with all rapid alternating movements, but not necessarily decremation.  He is somewhat apraxic when asked to perform these movements Gait and Station: The patient pushes off of the chair.  He has decreased arm swing on the left.  He drags the right foot.  He is flexed at the waist.  He has Pisa syndrome to the right.    Chemistry      Component Value Date/Time   NA 140 12/16/2018 1029   K 4.5 12/16/2018  1029   CL 105 12/16/2018 1029   CO2 28 12/16/2018 1029   BUN 17 12/16/2018 1029   CREATININE 1.06 12/16/2018 1029      Component Value Date/Time   CALCIUM 9.4 12/16/2018 1029   AST 13 12/16/2018 1029   ALT 6 (L) 12/16/2018 1029   BILITOT 0.8 12/16/2018 1029     Lab Results  Component Value Date   WBC 9.6 12/16/2018   HGB 14.8 12/16/2018   HCT 46.5 12/16/2018   MCV 83.6 12/16/2018   PLT 268 12/16/2018     Assessment and Plan:   1. Parkinsons disease -Continue carbidopa/levodopa 25/100 CR, up to 9 tablets/day. -Continue entacapone with each levodopa dosage.Discussed with the patient and his wife that I am considering decreasing the entacapone here within the next visit or so, as this can increase confusion.  -Sounds like he is having some morning dystonia of the left foot before he gets his medication in.  However, once he gets medicine and and starts walking, it goes away quickly.  Recommended that he take his medication from the bedside stand and stay in the bed for another 30 minutes.  We discussed the value of Botox, but ultimately decided to hold off on that since the medicine really seems to take care of the dystonia.  -Start LSVT big since he is having trouble with ambulation.  He is homebound because of [place].  -I am going to ask home health and social work to come to the house.  I think his wife could use the services.  Also encouraged his wife to use our Parkinson's social worker   2. EDS -MSLT demonstrated pathologic sleepiness and concern for narcolepy. He didn't meet clinical criteria but would recommend treat as such. Didn't tolerate provigil 200 mg.   Had previously referred to [facility] sleep center, but patient/wife did not answer phone and multiple messages were left by [facility] to make an appointment.  3. Nocturia -had appt with Dr. Gilford Rile and had TURP and it really helped.  4. Parkinsons dementia  -Increase quetiapine to 25 mg twice per day.  As with last visit, we are unable to get an EKG right now because he is not in the office.  They understand risks, benefits, and side effects including the black box warning.  5.cough -Needs to follow-up with primary care. This does not sound aspiration related. It is unrelated to eating times.   Follow Up Instructions:  4 to 5 months, but wife is to call me or email me and let me know how he is doing.  She expressed understanding  -I discussed the assessment and treatment plan with the patient. The patient was provided an opportunity to ask questions and all were answered. The patient agreed with the plan and demonstrated an understanding of the instructions.   The patient was advised to call back or seek an in-person evaluation if  the symptoms worsen or if the condition fails to improve as anticipated.    Total Time spent in visit with the patient was:  25 min, of which more than 50% of the time was spent in counseling, as above.   Pt understands and agrees with the plan of care outlined.     Alonza Bogus, DO

## 2019-01-28 ENCOUNTER — Other Ambulatory Visit: Payer: Self-pay

## 2019-01-28 ENCOUNTER — Encounter: Payer: Self-pay | Admitting: Neurology

## 2019-01-28 ENCOUNTER — Telehealth (INDEPENDENT_AMBULATORY_CARE_PROVIDER_SITE_OTHER): Payer: PPO | Admitting: Neurology

## 2019-01-28 DIAGNOSIS — G20 Parkinson's disease: Secondary | ICD-10-CM

## 2019-01-28 DIAGNOSIS — L570 Actinic keratosis: Secondary | ICD-10-CM | POA: Diagnosis not present

## 2019-01-28 DIAGNOSIS — F0280 Dementia in other diseases classified elsewhere without behavioral disturbance: Secondary | ICD-10-CM | POA: Diagnosis not present

## 2019-01-28 DIAGNOSIS — D485 Neoplasm of uncertain behavior of skin: Secondary | ICD-10-CM | POA: Diagnosis not present

## 2019-01-28 DIAGNOSIS — D044 Carcinoma in situ of skin of scalp and neck: Secondary | ICD-10-CM | POA: Diagnosis not present

## 2019-01-29 ENCOUNTER — Other Ambulatory Visit: Payer: Self-pay

## 2019-01-29 DIAGNOSIS — G20 Parkinson's disease: Secondary | ICD-10-CM

## 2019-01-29 DIAGNOSIS — G20A1 Parkinson's disease without dyskinesia, without mention of fluctuations: Secondary | ICD-10-CM

## 2019-01-29 NOTE — Addendum Note (Signed)
Addended by: Ranae Plumber on: 01/29/2019 09:49 AM   Modules accepted: Orders

## 2019-01-29 NOTE — Progress Notes (Signed)
ORDER for [facility] placed PT, SW, and Nursing for LSVT  Order placed

## 2019-01-29 NOTE — Progress Notes (Signed)
Tat, Eustace Quail, DO  Ricardo Jericho, CMA        Adam Cordova,   I am going to have Adam Cordova put in a referral on this patient for LSVT. However, pts wife also needs social work and some home health care. Can you assist with this?    Thanks,   Alonza Bogus, DO  Director of [place]

## 2019-02-09 ENCOUNTER — Other Ambulatory Visit: Payer: Self-pay | Admitting: Neurology

## 2019-02-09 NOTE — Telephone Encounter (Signed)
Requested Prescriptions   Pending Prescriptions Disp Refills  . Carbidopa-Levodopa ER (SINEMET CR) 25-100 MG tablet controlled release [Pharmacy Med Name: CARBIDOPA-LEVO ER 25-100 TAB] 810 tablet 1    Sig: TAKE 9 TABLETS PER DAY AS DIRECTED   Rx last filled:08/24/18 #810 1 refills  Pt last seen:01/28/19 Assessment and Plan:   1. Parkinsons disease -Continue carbidopa/levodopa 25/100 CR, up to 9 tablets/day.  Follow up appt scheduled:07/05/19

## 2019-02-10 MED ORDER — QUETIAPINE FUMARATE 25 MG PO TABS: 25.0000 mg | ORAL_TABLET | Freq: Two times a day (BID) | ORAL | 1 refills | Status: DC

## 2019-02-17 DIAGNOSIS — N400 Enlarged prostate without lower urinary tract symptoms: Secondary | ICD-10-CM | POA: Diagnosis not present

## 2019-02-17 DIAGNOSIS — Z0000 Encounter for general adult medical examination without abnormal findings: Secondary | ICD-10-CM | POA: Diagnosis not present

## 2019-02-17 DIAGNOSIS — E7800 Pure hypercholesterolemia, unspecified: Secondary | ICD-10-CM | POA: Diagnosis not present

## 2019-02-17 DIAGNOSIS — I10 Essential (primary) hypertension: Secondary | ICD-10-CM | POA: Diagnosis not present

## 2019-02-17 DIAGNOSIS — N529 Male erectile dysfunction, unspecified: Secondary | ICD-10-CM | POA: Diagnosis not present

## 2019-02-17 DIAGNOSIS — G20 Parkinson's disease: Secondary | ICD-10-CM | POA: Diagnosis not present

## 2019-02-22 ENCOUNTER — Ambulatory Visit: Payer: PPO | Admitting: Neurology

## 2019-02-24 ENCOUNTER — Telehealth: Payer: Self-pay | Admitting: Neurology

## 2019-02-24 NOTE — Telephone Encounter (Signed)
Rhonda from [facility] left VM about nursing care starting tomorrow on this patient. Thanks!

## 2019-02-24 NOTE — Telephone Encounter (Signed)
Called Adam Cordova no answer Noted Patient nursing care will start tomorrow

## 2019-02-25 DIAGNOSIS — N138 Other obstructive and reflux uropathy: Secondary | ICD-10-CM | POA: Diagnosis not present

## 2019-02-25 DIAGNOSIS — Z9181 History of falling: Secondary | ICD-10-CM | POA: Diagnosis not present

## 2019-02-25 DIAGNOSIS — G249 Dystonia, unspecified: Secondary | ICD-10-CM | POA: Diagnosis not present

## 2019-02-25 DIAGNOSIS — R338 Other retention of urine: Secondary | ICD-10-CM | POA: Diagnosis not present

## 2019-02-25 DIAGNOSIS — G47429 Narcolepsy in conditions classified elsewhere without cataplexy: Secondary | ICD-10-CM | POA: Diagnosis not present

## 2019-02-25 DIAGNOSIS — G20 Parkinson's disease: Secondary | ICD-10-CM | POA: Diagnosis not present

## 2019-02-25 DIAGNOSIS — R443 Hallucinations, unspecified: Secondary | ICD-10-CM | POA: Diagnosis not present

## 2019-02-25 DIAGNOSIS — N401 Enlarged prostate with lower urinary tract symptoms: Secondary | ICD-10-CM | POA: Diagnosis not present

## 2019-02-25 DIAGNOSIS — F0280 Dementia in other diseases classified elsewhere without behavioral disturbance: Secondary | ICD-10-CM | POA: Diagnosis not present

## 2019-03-02 ENCOUNTER — Telehealth: Payer: Self-pay | Admitting: Neurology

## 2019-03-02 NOTE — Telephone Encounter (Signed)
Called spoke with Liji given verbal

## 2019-03-02 NOTE — Telephone Encounter (Signed)
Liji called from [facility] about needing verbal orders on patient for PT; 2 week 1, 1 week 1, 2 week 4, and 1 week 1. Thanks!

## 2019-03-04 ENCOUNTER — Telehealth: Payer: Self-pay | Admitting: Neurology

## 2019-03-04 DIAGNOSIS — G20 Parkinson's disease: Secondary | ICD-10-CM | POA: Diagnosis not present

## 2019-03-04 DIAGNOSIS — G249 Dystonia, unspecified: Secondary | ICD-10-CM | POA: Diagnosis not present

## 2019-03-04 DIAGNOSIS — F0280 Dementia in other diseases classified elsewhere without behavioral disturbance: Secondary | ICD-10-CM | POA: Diagnosis not present

## 2019-03-04 DIAGNOSIS — R443 Hallucinations, unspecified: Secondary | ICD-10-CM | POA: Diagnosis not present

## 2019-03-04 DIAGNOSIS — Z9181 History of falling: Secondary | ICD-10-CM | POA: Diagnosis not present

## 2019-03-04 DIAGNOSIS — G47429 Narcolepsy in conditions classified elsewhere without cataplexy: Secondary | ICD-10-CM | POA: Diagnosis not present

## 2019-03-04 DIAGNOSIS — R338 Other retention of urine: Secondary | ICD-10-CM | POA: Diagnosis not present

## 2019-03-04 DIAGNOSIS — N401 Enlarged prostate with lower urinary tract symptoms: Secondary | ICD-10-CM | POA: Diagnosis not present

## 2019-03-04 DIAGNOSIS — N138 Other obstructive and reflux uropathy: Secondary | ICD-10-CM | POA: Diagnosis not present

## 2019-03-04 NOTE — Telephone Encounter (Signed)
Verbal given for pt will miss 3 visit due to going out of town Pt spouse ok with the miss visit per [facility]  They will not be R/S

## 2019-03-04 NOTE — Telephone Encounter (Signed)
New Message  Adam Cordova verbalized to omit tomorrows nurse visit and Wednesday through Friday of next week nurse visits due to going out of town.   Adam Cordova verbalized needing verbal order stating it's okay.

## 2019-03-09 ENCOUNTER — Telehealth: Payer: Self-pay | Admitting: Neurology

## 2019-03-09 NOTE — Telephone Encounter (Signed)
Patient will be out of town this week and is needing to hold off on PT for the week. Please call Truman Hayward back at [phone] for verbal orders. Thanks!

## 2019-03-09 NOTE — Telephone Encounter (Signed)
Called spoke with Liji verbal given to hold visit but will r/s for later date when patient is back in town

## 2019-03-18 ENCOUNTER — Encounter (HOSPITAL_COMMUNITY): Payer: Self-pay

## 2019-03-18 ENCOUNTER — Other Ambulatory Visit: Payer: Self-pay

## 2019-03-18 DIAGNOSIS — S01302A Unspecified open wound of left ear, initial encounter: Secondary | ICD-10-CM | POA: Diagnosis not present

## 2019-03-18 DIAGNOSIS — Y999 Unspecified external cause status: Secondary | ICD-10-CM | POA: Insufficient documentation

## 2019-03-18 DIAGNOSIS — G20 Parkinson's disease: Secondary | ICD-10-CM | POA: Diagnosis not present

## 2019-03-18 DIAGNOSIS — Y929 Unspecified place or not applicable: Secondary | ICD-10-CM | POA: Insufficient documentation

## 2019-03-18 DIAGNOSIS — Z23 Encounter for immunization: Secondary | ICD-10-CM | POA: Insufficient documentation

## 2019-03-18 DIAGNOSIS — Z79899 Other long term (current) drug therapy: Secondary | ICD-10-CM | POA: Diagnosis not present

## 2019-03-18 DIAGNOSIS — S01312A Laceration without foreign body of left ear, initial encounter: Secondary | ICD-10-CM | POA: Diagnosis not present

## 2019-03-18 DIAGNOSIS — Y9389 Activity, other specified: Secondary | ICD-10-CM | POA: Insufficient documentation

## 2019-03-18 DIAGNOSIS — W01119A Fall on same level from slipping, tripping and stumbling with subsequent striking against unspecified sharp object, initial encounter: Secondary | ICD-10-CM | POA: Insufficient documentation

## 2019-03-18 DIAGNOSIS — F0280 Dementia in other diseases classified elsewhere without behavioral disturbance: Secondary | ICD-10-CM | POA: Insufficient documentation

## 2019-03-18 NOTE — ED Triage Notes (Signed)
Pt arrived after a fall roughly 45 minutes ago. Pt has a 4 inch laceration to his left ear, bleeding controled. Pt denies LOC or use of blood thinners.

## 2019-03-19 ENCOUNTER — Emergency Department (HOSPITAL_COMMUNITY)
Admission: EM | Admit: 2019-03-19 | Discharge: 2019-03-19 | Disposition: A | Discharge status: Home / Self Care | Payer: PPO | Attending: Emergency Medicine | Admitting: Emergency Medicine

## 2019-03-19 ENCOUNTER — Encounter (HOSPITAL_COMMUNITY): Payer: Self-pay | Admitting: Emergency Medicine

## 2019-03-19 ENCOUNTER — Other Ambulatory Visit: Payer: Self-pay | Admitting: Neurology

## 2019-03-19 ENCOUNTER — Telehealth: Payer: Self-pay | Admitting: Neurology

## 2019-03-19 DIAGNOSIS — S01302A Unspecified open wound of left ear, initial encounter: Secondary | ICD-10-CM | POA: Diagnosis not present

## 2019-03-19 DIAGNOSIS — S01312A Laceration without foreign body of left ear, initial encounter: Secondary | ICD-10-CM

## 2019-03-19 DIAGNOSIS — W19XXXA Unspecified fall, initial encounter: Secondary | ICD-10-CM

## 2019-03-19 HISTORY — DX: Dementia in other diseases classified elsewhere, unspecified severity, without behavioral disturbance, psychotic disturbance, mood disturbance, and anxiety: G20.A1

## 2019-03-19 HISTORY — DX: Dementia in other diseases classified elsewhere, unspecified severity, without behavioral disturbance, psychotic disturbance, mood disturbance, and anxiety: F02.80

## 2019-03-19 MED ORDER — CEFAZOLIN SODIUM-DEXTROSE 1-4 GM/50ML-% IV SOLN
1.0000 g | Freq: Once | INTRAVENOUS | Status: AC
  Administered 2019-03-19: 1 g via INTRAVENOUS
  Filled 2019-03-19: qty 50

## 2019-03-19 MED ORDER — LIDOCAINE-EPINEPHRINE 2 %-1:100000 IJ SOLN
20.0000 mL | Freq: Once | INTRAMUSCULAR | Status: AC
  Administered 2019-03-19: 20 mL
  Filled 2019-03-19: qty 1

## 2019-03-19 MED ORDER — BACITRACIN ZINC 500 UNIT/GM EX OINT: TOPICAL_OINTMENT | CUTANEOUS | 0 refills | Status: DC

## 2019-03-19 MED ORDER — TETANUS-DIPHTH-ACELL PERTUSSIS 5-2.5-18.5 LF-MCG/0.5 IM SUSP
0.5000 mL | Freq: Once | INTRAMUSCULAR | Status: AC
  Administered 2019-03-19: 0.5 mL via INTRAMUSCULAR
  Filled 2019-03-19: qty 0.5

## 2019-03-19 NOTE — Consult Note (Signed)
Reason for Consult: Ear laceration Referring Physician: ER  Adam Cordova is an 63 y.o. male.  HPI: 63 year old male with Parkinson's who fell earlier this evening while trying to use the bathroom and cut his left ear.  He came to the ER for care.  He complains only of left ear pain and bleeding.  Past Medical History:  Diagnosis Date  . BPH (benign prostatic hypertrophy) with urinary retention   . Foley catheter in place   . Parkinson disease [facility])    neurologist--  dr Richardean Chimera  in [place]  . Parkinson's disease dementia (Adam Cordova)   . UTI (urinary tract infection)     Past Surgical History:  Procedure Laterality Date  . TRANSURETHRAL RESECTION OF PROSTATE N/A 11/23/2012   Procedure: TRANSURETHRAL RESECTION OF THE PROSTATE WITH GYRUS INSTRUMENTS;  Surgeon: Bernestine Amass, MD;  Location: [facility];  Service: Urology;  Laterality: N/A;  . TRANSURETHRAL RESECTION OF PROSTATE N/A 07/09/2017   Procedure: TRANSURETHRAL RESECTION OF THE PROSTATE (TURP), BIPOLAR;  Surgeon: Ceasar Mons, MD;  Location: [facility];  Service: Urology;  Laterality: N/A;    Family History  Problem Relation Age of Onset  . Dementia Mother   . Neuropathy Mother   . Heart disease Father   . Heart disease Brother     Social History:  reports that he has never smoked. He has never used smokeless tobacco. He reports current alcohol use. He reports that he does not use drugs.  Allergies:  Allergies  Allergen Reactions  . Bee Venom Anaphylaxis  . Levaquin [Levofloxacin Hemihydrate] Anaphylaxis and Swelling  . Sertraline Diarrhea and Nausea Only    Medications: I have reviewed the patient's current medications.  No results found for this or any previous visit (from the past 48 hour(s)).  No results found.  Review of Systems  All other systems reviewed and are negative.  Blood pressure (!) 167/104, pulse 78, temperature 98.3 F (36.8 C), temperature source Oral, resp. rate 15,  height 5\' 9"  (1.753 m), weight 72.6 kg, SpO2 96 %. Physical Exam  Constitutional: He is oriented to person, place, and time. He appears well-developed and well-nourished. No distress.  HENT:  Head: Normocephalic and atraumatic.  Right Ear: External ear normal.  Nose: Nose normal.  Mouth/Throat: Oropharynx is clear and moist.  Complex wound involving superior helix through helical root cartilage, 7 cm, with Y-shape across scaphoid region  Eyes: Pupils are equal, round, and reactive to light. Conjunctivae and EOM are normal.  Neck: Normal range of motion. Neck supple.  Cardiovascular: Normal rate.  Respiratory: Effort normal.  Neurological: He is alert and oriented to person, place, and time. No cranial nerve deficit.  Skin: Skin is warm and dry.  Psychiatric: He has a normal mood and affect. His behavior is normal. Judgment and thought content normal.    Assessment/Plan: Left ear open wound  I recommended and performed layered closure at the bedside.  See procedure note.  He was instructed to apply Bacitracin twice daily and follow-up in my office in one week for suture removal.  Melida Quitter 03/19/2019, 3:56 AM

## 2019-03-19 NOTE — Telephone Encounter (Signed)
Ok to give order? 

## 2019-03-19 NOTE — ED Notes (Addendum)
Pt and spouse think he fell on the base of a barstool that's decorated which resulted in a left ear laceration.

## 2019-03-19 NOTE — ED Provider Notes (Signed)
[facility] DEPT Provider Note: Georgena Spurling, MD, FACEP  CSN: [id number] MRN: [id number] ARRIVAL: 03/18/19 at 2306 ROOM: WA09/WA09   CHIEF COMPLAINT  Ear Laceration  Level 5 caveat: Dementia HISTORY OF PRESENT ILLNESS  03/19/19 2:27 AM Adam Cordova is a 63 y.o. male who fell about 45 minutes prior to arrival.  He has a laceration to his left ear.  Bleeding is controlled.  He rates associated pain is an 8 out of 10.  He denies loss of consciousness.  He is not on anticoagulation.  He states he was trying to use a urinal when he fell.  He has Parkinson's disease and has difficulty forming sentences.   Past Medical History:  Diagnosis Date  . BPH (benign prostatic hypertrophy) with urinary retention   . Foley catheter in place   . Parkinson disease [facility])    neurologist--  dr Richardean Chimera  in [place]  . Parkinson's disease dementia ([place])   . UTI (urinary tract infection)     Past Surgical History:  Procedure Laterality Date  . TRANSURETHRAL RESECTION OF PROSTATE N/A 11/23/2012   Procedure: TRANSURETHRAL RESECTION OF THE PROSTATE WITH GYRUS INSTRUMENTS;  Surgeon: Bernestine Amass, MD;  Location: [facility];  Service: Urology;  Laterality: N/A;  . TRANSURETHRAL RESECTION OF PROSTATE N/A 07/09/2017   Procedure: TRANSURETHRAL RESECTION OF THE PROSTATE (TURP), BIPOLAR;  Surgeon: Ceasar Mons, MD;  Location: [facility];  Service: Urology;  Laterality: N/A;    Family History  Problem Relation Age of Onset  . Dementia Mother   . Neuropathy Mother   . Heart disease Father   . Heart disease Brother     Social History   Tobacco Use  . Smoking status: Never Smoker  . Smokeless tobacco: Never Used  Substance Use Topics  . Alcohol use: Yes    Comment: 7-10 glasses of wine a week  . Drug use: No    Prior to Admission medications   Medication Sig Start Date End Date Taking? Authorizing Provider  Carbidopa-Levodopa ER (SINEMET CR) 25-100 MG tablet  controlled release TAKE 9 TABLETS PER DAY AS DIRECTED 02/09/19   Tat, Eustace Quail, DO  donepezil (ARICEPT) 10 MG tablet TAKE 1 TABLET BY MOUTH EVERYDAY AT BEDTIME 01/14/19   Tat, Rebecca S, DO  entacapone (COMTAN) 200 MG tablet TAKE ONE TABLET BY MOUTH FOUR TIMES DAILY  Patient taking differently: Take 200 mg by mouth 4 (four) times daily.  09/11/18   Tat, Eustace Quail, DO  naproxen sodium (ALEVE) 220 MG tablet Take 440 mg by mouth 2 (two) times daily as needed (for pain.).    [provider]  QUEtiapine (SEROQUEL) 25 MG tablet Take 1 tablet (25 mg total) by mouth 2 (two) times daily. 02/10/19   Tat, Eustace Quail, DO  Tamsulosin HCl (FLOMAX) 0.4 MG CAPS Take 0.4 mg by mouth 2 (two) times daily.     [provider]    Allergies Bee venom, Levaquin [levofloxacin hemihydrate], and Sertraline   REVIEW OF SYSTEMS     PHYSICAL EXAMINATION  Initial Vital Signs Blood pressure (!) 167/104, pulse 78, temperature 98.3 F (36.8 C), temperature source Oral, resp. rate 15, height 5\' 9"  (1.753 m), weight 72.6 kg, SpO2 96 %.  Examination General: Well-developed, well-nourished male in no acute distress; appearance consistent with age of record HENT: normocephalic; TMs normal; full-thickness laceration of helix of left ear with cartilage involvement:    Eyes: pupils equal, round and reactive to light; extraocular muscles  intact Neck: supple nontender Heart: regular rate and rhythm Lungs: clear to auscultation bilaterally Abdomen: soft; nondistended; nontender; bowel sounds present Extremities: No deformity; full range of motion; pulses normal Neurologic: Awake, alert and oriented to person, place and month but not date; stuttering speech; motor function intact in all extremities and symmetric; no facial droop; generalized tremor Skin: Warm and dry Psychiatric: Normal mood and affect   RESULTS  Summary of this visit's results, reviewed by myself:   EKG Interpretation  Date/Time:     Ventricular Rate:    PR Interval:    QRS Duration:   QT Interval:    QTC Calculation:   R Axis:     Text Interpretation:        Laboratory Studies: No results found for this or any previous visit (from the past 24 hour(s)). Imaging Studies: No results found.  ED COURSE and MDM  Nursing notes and initial vitals signs, including pulse oximetry, reviewed.  Vitals:   03/18/19 2325 03/19/19 0022 03/19/19 0024  BP: (!) 172/110 (!) 186/115 (!) 167/104  Pulse: 87 79 78  Resp: 15    Temp: 98.3 F (36.8 C)    TempSrc: Oral    SpO2: 95% 97% 96%  Weight: 72.6 kg    Height: 5\' 9"  (1.753 m)     2:56 AM Dr. Redmond Baseman of ENT consulted.  He will see patient in the ED. Tdap and Ancef 1 g IV given.  4:08 AM Wound repaired by Dr. Redmond Baseman.  He does not advise systemic antibiotics but would like the patient's wound dressed with bacitracin ointment twice daily.  He will see him in his office in a week.    PROCEDURES    ED DIAGNOSES     ICD-10-CM   1. Complex laceration of ear, left, initial encounter  S01.312A   2. Fall in home, initial encounter  W19.XXXA    Y92.009        Shanon Rosser, MD 03/19/19 [phone]

## 2019-03-19 NOTE — Telephone Encounter (Signed)
Requested Prescriptions   Pending Prescriptions Disp Refills  . entacapone (COMTAN) 200 MG tablet [Pharmacy Med Name: Entacapone Oral Tablet 200 MG] 120 tablet 0    Sig: TAKE ONE TABLET BY MOUTH FOUR TIMES DAILY   Rx last filled: 09/11/18 #120 5 REFILLS  Pt last seen:01/28/19   Follow up appt scheduled:07/05/19

## 2019-03-19 NOTE — Procedures (Signed)
Preop diagnosis: Left complex external ear open wound Postop diagnosis: same Procedure: Complex closure of left external ear wound, 7 cm Surgeon: Redmond Baseman Anesth: Local with 2% lidocaine with 1:200,000 epinephrine Comp: None Findings: The wound involves the superior helix and helical root through the cartilage. Description: After discussing risks, benefits, and alternatives, the left ear was prepped with [place] and the wound was injected with local anesthetic.  The wound was copiously irrigated with saline.  The cartilage of the helical root was approximated using 4-0 Vicryl in a simple, interrupted fashion.  The subcutaneous layer was closed in like fashion.  The skin was then closed with 5-0 Nylon in a simple, running and simple, interrupted fashion.  He was cleaned off and the wound was coated in lube.  He was returned to nursing care in stable condition.

## 2019-03-19 NOTE — Telephone Encounter (Signed)
[place] she was given verbal orders

## 2019-03-19 NOTE — Telephone Encounter (Signed)
New Message  Deneise Lever from [facility] [place] [facility] needs a verbal order due to patient would like to add occupational therapy also.   Deneise Lever verbalized they are needing two for therapy and two for occupational therapy for the next four weeks.

## 2019-03-25 DIAGNOSIS — G20 Parkinson's disease: Secondary | ICD-10-CM | POA: Diagnosis not present

## 2019-03-25 DIAGNOSIS — R3914 Feeling of incomplete bladder emptying: Secondary | ICD-10-CM | POA: Diagnosis not present

## 2019-03-25 DIAGNOSIS — N281 Cyst of kidney, acquired: Secondary | ICD-10-CM | POA: Diagnosis not present

## 2019-03-25 DIAGNOSIS — N401 Enlarged prostate with lower urinary tract symptoms: Secondary | ICD-10-CM | POA: Diagnosis not present

## 2019-03-25 DIAGNOSIS — S01319A Laceration without foreign body of unspecified ear, initial encounter: Secondary | ICD-10-CM | POA: Insufficient documentation

## 2019-03-25 DIAGNOSIS — N528 Other male erectile dysfunction: Secondary | ICD-10-CM | POA: Diagnosis not present

## 2019-03-30 DIAGNOSIS — R0789 Other chest pain: Secondary | ICD-10-CM | POA: Diagnosis not present

## 2019-03-30 DIAGNOSIS — S20211A Contusion of right front wall of thorax, initial encounter: Secondary | ICD-10-CM | POA: Diagnosis not present

## 2019-03-30 DIAGNOSIS — W19XXXA Unspecified fall, initial encounter: Secondary | ICD-10-CM | POA: Diagnosis not present

## 2019-03-30 DIAGNOSIS — G20 Parkinson's disease: Secondary | ICD-10-CM | POA: Diagnosis not present

## 2019-04-01 ENCOUNTER — Telehealth: Payer: Self-pay

## 2019-04-01 ENCOUNTER — Telehealth: Payer: Self-pay | Admitting: Neurology

## 2019-04-01 NOTE — Telephone Encounter (Signed)
Called spoke with Maudie Mercury she was made aware of provider response

## 2019-04-01 NOTE — Telephone Encounter (Signed)
If he is not in a lot of pain, would continue with therapy

## 2019-04-01 NOTE — Telephone Encounter (Signed)
Left message with after hour service on 04-01-19 12:37 pm   Tammy from [place] states the patient missed his treatment on Wednesday and needs orders to move his appt

## 2019-04-01 NOTE — Telephone Encounter (Signed)
Received call from [place] from [facility]  She states that patient had fall on Monday that resulted to 10th rib fracture.  She is requesting to put PT,OT orders on HOLD or continue with therapy Ok to place on HOLD if so how long to place orders on HOLD

## 2019-04-01 NOTE — Telephone Encounter (Signed)
Given verbal order to r/s missed appt. Spoke with [employer]

## 2019-04-05 DIAGNOSIS — F0280 Dementia in other diseases classified elsewhere without behavioral disturbance: Secondary | ICD-10-CM | POA: Diagnosis not present

## 2019-04-05 DIAGNOSIS — N401 Enlarged prostate with lower urinary tract symptoms: Secondary | ICD-10-CM | POA: Diagnosis not present

## 2019-04-05 DIAGNOSIS — Z9181 History of falling: Secondary | ICD-10-CM | POA: Diagnosis not present

## 2019-04-05 DIAGNOSIS — G47429 Narcolepsy in conditions classified elsewhere without cataplexy: Secondary | ICD-10-CM | POA: Diagnosis not present

## 2019-04-05 DIAGNOSIS — G249 Dystonia, unspecified: Secondary | ICD-10-CM | POA: Diagnosis not present

## 2019-04-05 DIAGNOSIS — R338 Other retention of urine: Secondary | ICD-10-CM | POA: Diagnosis not present

## 2019-04-05 DIAGNOSIS — G20 Parkinson's disease: Secondary | ICD-10-CM | POA: Diagnosis not present

## 2019-04-05 DIAGNOSIS — R443 Hallucinations, unspecified: Secondary | ICD-10-CM | POA: Diagnosis not present

## 2019-04-05 DIAGNOSIS — N138 Other obstructive and reflux uropathy: Secondary | ICD-10-CM | POA: Diagnosis not present

## 2019-04-16 NOTE — Telephone Encounter (Signed)
Called pharmacy rx was pick up today at 11:35 am Pt was able to pick up rx

## 2019-04-22 DIAGNOSIS — L570 Actinic keratosis: Secondary | ICD-10-CM | POA: Diagnosis not present

## 2019-04-22 DIAGNOSIS — L821 Other seborrheic keratosis: Secondary | ICD-10-CM | POA: Diagnosis not present

## 2019-04-22 DIAGNOSIS — D485 Neoplasm of uncertain behavior of skin: Secondary | ICD-10-CM | POA: Diagnosis not present

## 2019-04-22 DIAGNOSIS — Z85828 Personal history of other malignant neoplasm of skin: Secondary | ICD-10-CM | POA: Diagnosis not present

## 2019-04-22 DIAGNOSIS — D225 Melanocytic nevi of trunk: Secondary | ICD-10-CM | POA: Diagnosis not present

## 2019-04-22 DIAGNOSIS — Z86008 Personal history of in-situ neoplasm of other site: Secondary | ICD-10-CM | POA: Diagnosis not present

## 2019-04-22 DIAGNOSIS — D044 Carcinoma in situ of skin of scalp and neck: Secondary | ICD-10-CM | POA: Diagnosis not present

## 2019-05-12 ENCOUNTER — Other Ambulatory Visit: Payer: Self-pay | Admitting: Neurology

## 2019-05-18 DIAGNOSIS — D485 Neoplasm of uncertain behavior of skin: Secondary | ICD-10-CM | POA: Diagnosis not present

## 2019-05-18 DIAGNOSIS — L988 Other specified disorders of the skin and subcutaneous tissue: Secondary | ICD-10-CM | POA: Diagnosis not present

## 2019-07-02 NOTE — Progress Notes (Signed)
Adam Cordova was seen today in follow up for Parkinsons disease.  Medical records have been reviewed since our last visit.  He was in the emergency room with a fall on March 19, 2019.  He sustained a laceration to the left ear.  The wound was repaired by ENT.  Pt denies lightheadedness, near syncope.   Mood has been good.  In regards to hallucinations, they state that seroquel seemed to be helping but now they seem to be "bad."  Wife states that they were at the beach and he was at pacing back and forth and keeping wife up and accusing her of being unfaithful.  Besides for the fall where he hurt his ear, there have been other falls.  He uses the [place] in the AM.   Still coughing at bedtime, worse with laying down.    Current prescribed movement disorder medications: Carbidopa/levodopa 25/100 CR, up to 9 tablets/day Entacapone 200 mg  qid Quetiapine 25 mg twice per day    PREVIOUS MEDICATIONS:  Sinemet, Mirapex and exelon patch; Provigil  ALLERGIES:   Allergies  Allergen Reactions  . Bee Venom Anaphylaxis  . Levaquin [Levofloxacin Hemihydrate] Anaphylaxis and Swelling  . Sertraline Diarrhea and Nausea Only    CURRENT MEDICATIONS:  Outpatient Encounter Medications as of 07/05/2019  Medication Sig  . Carbidopa-Levodopa ER (SINEMET CR) 25-100 MG tablet controlled release TAKE 9 TABLETS PER DAY AS DIRECTED  . donepezil (ARICEPT) 10 MG tablet TAKE 1 TABLET BY MOUTH EVERYDAY AT BEDTIME  . entacapone (COMTAN) 200 MG tablet TAKE ONE TABLET BY MOUTH FOUR TIMES DAILY   . naproxen sodium (ALEVE) 220 MG tablet Take 440 mg by mouth 2 (two) times daily as needed (for pain.).  Marland Kitchen QUEtiapine (SEROQUEL) 25 MG tablet TAKE 1 TABLET BY MOUTH TWICE A DAY  . Tamsulosin HCl (FLOMAX) 0.4 MG CAPS Take 0.4 mg by mouth 2 (two) times daily.   . [DISCONTINUED] bacitracin ointment Apply to left ear wound twice daily.   No facility-administered encounter medications on file as of 07/05/2019.     PAST MEDICAL  HISTORY:   Past Medical History:  Diagnosis Date  . BPH (benign prostatic hypertrophy) with urinary retention   . Foley catheter in place   . Parkinson disease [facility])    neurologist--  dr Richardean Chimera  in [place]  . Parkinson's disease dementia ([place])   . UTI (urinary tract infection)     PAST SURGICAL HISTORY:   Past Surgical History:  Procedure Laterality Date  . TRANSURETHRAL RESECTION OF PROSTATE N/A 11/23/2012   Procedure: TRANSURETHRAL RESECTION OF THE PROSTATE WITH GYRUS INSTRUMENTS;  Surgeon: Bernestine Amass, MD;  Location: [facility];  Service: Urology;  Laterality: N/A;  . TRANSURETHRAL RESECTION OF PROSTATE N/A 07/09/2017   Procedure: TRANSURETHRAL RESECTION OF THE PROSTATE (TURP), BIPOLAR;  Surgeon: Ceasar Mons, MD;  Location[facility];  Service: Urology;  Laterality: N/A;    SOCIAL HISTORY:   Social History   Socioeconomic History  . Marital status: Married    Spouse name: Not on file  . Number of children: Not on file  . Years of education: Not on file  . Highest education level: Not on file  Occupational History  . Occupation: retired    Comment: Visual merchandiser  Social Needs  . Financial resource strain: Not on file  . Food insecurity    Worry: Not on file    Inability: Not on file  . Transportation needs  Medical: Not on file    Non-medical: Not on file  Tobacco Use  . Smoking status: Never Smoker  . Smokeless tobacco: Never Used  Substance and Sexual Activity  . Alcohol use: Yes    Comment: 7-10 glasses of wine a week  . Drug use: No  . Sexual activity: Not on file  Lifestyle  . Physical activity    Days per week: Not on file    Minutes per session: Not on file  . Stress: Not on file  Relationships  . Social Herbalist on phone: Not on file    Gets together: Not on file    Attends religious service: Not on file    Active member of club or organization: Not on file    Attends meetings of clubs or  organizations: Not on file    Relationship status: Not on file  . Intimate partner violence    Fear of current or ex partner: Not on file    Emotionally abused: Not on file    Physically abused: Not on file    Forced sexual activity: Not on file  Other Topics Concern  . Not on file  Social History Narrative  . Not on file    FAMILY HISTORY:   Family Status  Relation Name Status  . Mother  Deceased  . Father  Deceased  . Brother x3 Alive  . Daughter  Alive  . Son  Alive    ROS:  Review of Systems  Unable to perform ROS: Dementia    PHYSICAL EXAMINATION:    VITALS:   Vitals:   07/05/19 0839  BP: 109/74  Pulse: 88  Resp: 18  Temp: 98 F (36.7 C)  SpO2: 97%  Weight: 174 lb 9.6 oz (79.2 kg)  Height: _0  (1.778 m)    GEN:  The patient appears stated age and is in NAD. HEENT:  Normocephalic, atraumatic.  The mucous membranes are moist. The superficial temporal arteries are without ropiness or tenderness. CV:  RRR Lungs:  CTAB Neck/HEME:  There are no carotid bruits bilaterally.  Neurological examination:  Orientation: The patient is alert and oriented x3. Cranial nerves: There is good facial symmetry with facial hypomimia. The speech is fluent and clear. Soft palate rises symmetrically and there is no tongue deviation. Hearing is intact to conversational tone. Sensation: Sensation is intact to light touch throughout Motor: Strength is at least antigravity x4.  Movement examination: Tone: There is normal tone in the upper and lower extremities. Abnormal movements: None Coordination:  There is  decremation with RAM's, with any form of RAMS, including alternating supination and pronation of the forearm, hand opening and closing, finger taps, heel taps and toe taps. Gait and Station: The patient pushes off of the chair to arise.  He has start hesitation.  He drags the left leg with ambulation.  He is unstable in the turns.   ASSESSMENT/PLAN:  1. Parkinsons  disease -Continue carbidopa/levodopa 25/100 CR, up to 9 tablets/day (dosed 4 times daily). -decrease entacapone to 200 mg bid x 2 weeks then 1 daily for 2 weeks and then d/c it             -Sounds like he is having some morning dystonia of the left foot before he gets his medication in.  However, once he gets medicine and and starts walking, it goes away quickly.  Recommended that he take his medication from the bedside stand and stay in the bed for  another 30 minutes.  We discussed the value of Botox, but ultimately decided to hold off on that since the medicine really seems to take care of the dystonia.             -needs to use [place] at all times.  He is frustrated by this  -recommend ACT gym  -met with Education officer, museum.  Wife noting increasing caregiver burden and stress.  Recommended individual counseling and caregiver group for her.   2. EDS -MSLT demonstrated pathologic sleepiness and concern for narcolepy. He didn't meet clinical criteria but would recommend treat as such. Didn't tolerate provigil 200 mg.Had previously referred to [facility], but patient/wife did not answer phone and multiple messages were left by [facility] to make an appointment.  3. Nocturia -had appt with Dr. Gilford Rile and had TURP and it really helped.  4. Parkinsons dementia -continue seroquel 25 mg bid.  May need increased but will stop entacapone first  5.cough -Needs to follow-up with primary care. This does not sound aspiration related. It is unrelated to eating times.  Sounds related to reflux.  Try wedge pillow and follow up with PCP   6.  Follow up is anticipated in the next 4-6 months, sooner should new neurologic issues arise.  Much greater than 50% of this visit was spent in counseling and coordinating care.  Total face to face time:  40 min  Cc:  [name], MD

## 2019-07-05 ENCOUNTER — Encounter: Payer: Self-pay | Admitting: Neurology

## 2019-07-05 ENCOUNTER — Ambulatory Visit: Payer: PPO | Admitting: Neurology

## 2019-07-05 ENCOUNTER — Ambulatory Visit (INDEPENDENT_AMBULATORY_CARE_PROVIDER_SITE_OTHER): Payer: PPO | Admitting: Clinical

## 2019-07-05 ENCOUNTER — Other Ambulatory Visit: Payer: Self-pay

## 2019-07-05 VITALS — BP 109/74 | HR 88 | Temp 98.0°F | Resp 18 | Ht 70.0 in | Wt 174.6 lb

## 2019-07-05 DIAGNOSIS — R05 Cough: Secondary | ICD-10-CM

## 2019-07-05 DIAGNOSIS — F0280 Dementia in other diseases classified elsewhere without behavioral disturbance: Secondary | ICD-10-CM | POA: Diagnosis not present

## 2019-07-05 DIAGNOSIS — R059 Cough, unspecified: Secondary | ICD-10-CM

## 2019-07-05 DIAGNOSIS — G20 Parkinson's disease: Secondary | ICD-10-CM | POA: Diagnosis not present

## 2019-07-05 NOTE — BH Specialist Note (Signed)
Joint session with pt and his wife, [name]. Patient presents with sx of Parkinson's related dementia. [facility] provided supportive counseling as wife shares caregiver stressors along with reported increase in delusional thought (ie belief wife being unfaithful). Couple responded well to psychoeducation re benefits of mitigating stress to improve relationship satisfaction and health of both individuals. Both pt and wife would likely benefit from increased social activities, discussed exercise options for pt including ACT gym, online [employer] for Advanced PD, PD Dance class. Discussed PD Care Partner's Support group as well as individual counseling, wife amendable. Pt willing to try PD dance and LCSW provided registration information. Also discussed and provided resources re respite care as family would likely benefit from engagement in these services. LCSW encouraged couple to reach out as needs arise.   PLAN: Follow up with behavioral health clinician on : as needed Behavioral recommendations:  1. Wife to engage in individual counseling and encouraged to attend PD Care Partner's Support Group 2. Wife to contact 2 respite resources provided 3. Pt to engage in Parkinson's Dance Zoom class

## 2019-07-05 NOTE — Patient Instructions (Addendum)
1.  decrease entacapone to 200 mg twice daily x 2 weeks then 1 time daily for 2 weeks and then d/c it 2.  ACT gym 3.  [place] at all times 4.  Keep quetipine 25 mg twice per day.  I may need to increase that in the future.

## 2019-07-17 ENCOUNTER — Other Ambulatory Visit: Payer: Self-pay | Admitting: Neurology

## 2019-08-20 ENCOUNTER — Other Ambulatory Visit: Payer: Self-pay | Admitting: Neurology

## 2019-09-13 ENCOUNTER — Telehealth: Payer: Self-pay

## 2019-09-13 DIAGNOSIS — R519 Headache, unspecified: Secondary | ICD-10-CM | POA: Diagnosis not present

## 2019-09-13 DIAGNOSIS — M19041 Primary osteoarthritis, right hand: Secondary | ICD-10-CM | POA: Diagnosis not present

## 2019-09-13 DIAGNOSIS — G20 Parkinson's disease: Secondary | ICD-10-CM | POA: Diagnosis not present

## 2019-09-13 DIAGNOSIS — G5631 Lesion of radial nerve, right upper limb: Secondary | ICD-10-CM | POA: Diagnosis not present

## 2019-09-13 NOTE — Telephone Encounter (Signed)
See my chart message.  Tell patients wife I said to patient him evaluated in ER ASAP

## 2019-09-13 NOTE — Telephone Encounter (Signed)
Pt wife sent a [employer], I am worried about Adam Cordova. He has declined since we last saw you, and this morning he woke up and has no use of his right hand. It is kind of curled in, and he cannot grip with it or straighten it out. We are in [place], [place]. at our daughter's house, but I wanted to message you about this. His face looks normal and since he has gotten fully awake, his speech is ok. (before that it was garbled, and I could not understand him. This Has happened several times.) He says he feels "edgy", but cannot really explain. Is this just another symptom go PD? Per Dr. Carles Collet pt wife was called back and encouraged to take him to the nearest ER for evaluation. Pt wife agreed and stated she would take him to be evaluated

## 2019-09-27 NOTE — Progress Notes (Signed)
Adam Cordova was seen today in follow up for Parkinsons disease.  Medical records have been reviewed since our last visit.  He was in the ER in [place], however, and I don't have those records.  Wife emailed me on January 11.  They were in [place] at their daughter's house.  She stated that he woke up and had no use of the right hand.  Stated that it was curled in and could not grasp or straighten it.  I recommended that he go to the emergency room, which he did.  He fell in the waiting room of that hospital as his wife wasn't allowed to be with him.  Wife states that they did an MRI of the head and it was unremarkable. 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  It hasn't yet and they are concerned. Denies paresthesias of the arm/hand.  Patient states that he sleeps in a bed and not in a recliner.  States that he woke up from the bed like this.  Denies anything else in the bed with him and states that he did not sleep on anything unusual.  I d/c his entacapone last visit.  Also c/o cough.  Has been c/o that for a long time.  Have asked him to f/u with PCP because didn't sound related to Parkinsons Disease - sounded like reflux.  Current prescribed movement disorder medications: Carbidopa/levodopa 25/100 CR, up to 9 tablets/day Quetiapine 25 mg twice per day    PREVIOUS MEDICATIONS:  Sinemet, Mirapex and exelon patch; Provigil  ALLERGIES:   Allergies  Allergen Reactions  . Bee Venom Anaphylaxis  . Levaquin [Levofloxacin Hemihydrate] Anaphylaxis and Swelling  . Sertraline Diarrhea and Nausea Only    CURRENT MEDICATIONS:  Outpatient Encounter Medications as of 09/28/2019  Medication Sig  . Carbidopa-Levodopa ER (SINEMET CR) 25-100 MG tablet controlled release TAKE 9 TABLETS PER DAY AS DIRECTED  . donepezil (ARICEPT) 10 MG tablet TAKE 1 TABLET BY MOUTH EVERYDAY AT BEDTIME  . naproxen sodium (ALEVE) 220 MG tablet Take 440 mg by mouth 2 (two) times  daily as needed (for pain.).  Marland Kitchen QUEtiapine (SEROQUEL) 25 MG tablet TAKE 1 TABLET BY MOUTH TWICE A DAY  . Tamsulosin HCl (FLOMAX) 0.4 MG CAPS Take 0.4 mg by mouth 2 (two) times daily.   . [DISCONTINUED] entacapone (COMTAN) 200 MG tablet TAKE ONE TABLET BY MOUTH FOUR TIMES DAILY    No facility-administered encounter medications on file as of 09/28/2019.    PAST MEDICAL HISTORY:   Past Medical History:  Diagnosis Date  . BPH (benign prostatic hypertrophy) with urinary retention   . Foley catheter in place   . Parkinson disease [facility])    neurologist--  dr Richardean Chimera  in [place]  . Parkinson's disease dementia ([place])   . UTI (urinary tract infection)     PAST SURGICAL HISTORY:   Past Surgical History:  Procedure Laterality Date  . TRANSURETHRAL RESECTION OF PROSTATE N/A 11/23/2012   Procedure: TRANSURETHRAL RESECTION OF THE PROSTATE WITH GYRUS INSTRUMENTS;  Surgeon: Bernestine Amass, MD;  Location: [facility];  Service: Urology;  Laterality: N/A;  . TRANSURETHRAL RESECTION OF PROSTATE N/A 07/09/2017   Procedure: TRANSURETHRAL RESECTION OF THE PROSTATE (TURP), BIPOLAR;  Surgeon: Ceasar Mons, MD;  Location: [facility];  Service: Urology;  Laterality: N/A;    SOCIAL HISTORY:   Social History   Socioeconomic History  . Marital status: Married  Spouse name: Not on file  . Number of children: Not on file  . Years of education: Not on file  . Highest education level: Not on file  Occupational History  . Occupation: retired    Comment: Visual merchandiser  Tobacco Use  . Smoking status: Never Smoker  . Smokeless tobacco: Never Used  Substance and Sexual Activity  . Alcohol use: Yes    Comment: 7-10 glasses of wine a week  . Drug use: No  . Sexual activity: Not on file  Other Topics Concern  . Not on file  Social History Narrative  . Not on file   Social Determinants of Health   Financial Resource Strain:   . Difficulty of Paying Living Expenses: Not  on file  Food Insecurity:   . Worried About Charity fundraiser in the Last Year: Not on file  . Ran Out of Food in the Last Year: Not on file  Transportation Needs:   . Lack of Transportation (Medical): Not on file  . Lack of Transportation (Non-Medical): Not on file  Physical Activity:   . Days of Exercise per Week: Not on file  . Minutes of Exercise per Session: Not on file  Stress:   . Feeling of Stress : Not on file  Social Connections:   . Frequency of Communication with Friends and Family: Not on file  . Frequency of Social Gatherings with Friends and Family: Not on file  . Attends Religious Services: Not on file  . Active Member of Clubs or Organizations: Not on file  . Attends Archivist Meetings: Not on file  . Marital Status: Not on file  Intimate Partner Violence:   . Fear of Current or Ex-Partner: Not on file  . Emotionally Abused: Not on file  . Physically Abused: Not on file  . Sexually Abused: Not on file    FAMILY HISTORY:   Family Status  Relation Name Status  . Mother  Deceased  . Father  Deceased  . Brother x3 Alive  . Daughter  Alive  . Son  Alive    ROS:  Review of Systems  Unable to perform ROS: Dementia    PHYSICAL EXAMINATION:    VITALS:   Vitals:   09/28/19 1112  BP: (!) 151/99  Pulse: 79  Resp: 14  Weight: 170 lb 9.6 oz (77.4 kg)  Height: 5\' 9"  (1.753 m)    GEN:  The patient appears stated age and is in NAD. HEENT:  Normocephalic, atraumatic.  The mucous membranes are moist. The superficial temporal arteries are without ropiness or tenderness. CV:  RRR Lungs:  CTAB Neck/HEME:  There are no carotid bruits bilaterally.  Neurological examination:  Orientation: The patient is alert and oriented x3. Cranial nerves: There is good facial symmetry with facial hypomimia. The speech is fluent and clear. Soft palate rises symmetrically and there is no tongue deviation. Hearing is intact to conversational tone. Sensation:  Sensation is intact to light touch throughout Motor: Strength is at least antigravity x4.  There is R wrist drop.  There is inability to extend the right middle, ring and pointer finger.  Movement examination: Tone: There is normal tone in the upper and lower extremities. Abnormal movements: None Coordination:  There is  decremation with RAM's, with any form of RAMS, including alternating supination and pronation of the forearm, hand opening and closing, finger taps, heel taps and toe taps. Gait and Station: The patient pushes off of the chair to  arise.  He has start hesitation.  He drags the left leg with ambulation.  He is unstable in the turns.   ASSESSMENT/PLAN:  1. Parkinsons disease -Continue carbidopa/levodopa 25/100 CR, up to 9 tablets/day (dosed 4 times daily). -Doing somewhat better off of entacapone.  He will stay off of that.             -Sounds like he is having some morning dystonia of the left foot before he gets his medication in.  However, once he gets medicine and and starts walking, it goes away quickly.  Recommended that he take his medication from the bedside stand and stay in the bed for another 30 minutes.  We discussed the value of Botox, but ultimately decided to hold off on that since the medicine really seems to take care of the dystonia.   2. EDS -MSLT demonstrated pathologic sleepiness and concern for narcolepy. He didn't meet clinical criteria but would recommend treat as such. Didn't tolerate provigil 200 mg.Had previously referred to [facility], but patient/wife did not answer phone and multiple messages were left by [facility] to make an appointment.  3. Nocturia -had appt with Dr. Gilford Rile and had TURP and it really helped.  4. Parkinsons dementia -continue seroquel 25 mg bid.    5.cough with ?dysphagia -will do MBE to r/o silent aspiration  -Needs to follow-up with  primary care as well. This does not completely sound aspiration related. It is unrelated to eating times.  Could be from reflux  6.  Radial nerve palsy, right  -This will likely get better on its own, but we will go ahead and proceed with EMG.  -This has made caregiving more difficult.  Wife has a splint.  We will continue that.  May need to add physical therapy, depending on how he does and results of EMG. 7.  Patient has an upcoming appointment in March and they will keep that.  Total time spent on today's visit was 30 minutes, including both face-to-face time and nonface-to-face time.  Time included that spent on review of records (prior notes available to me/labs/imaging if pertinent), discussing treatment and goals, answering patient's questions and coordinating care.  Cc:  [name], MD

## 2019-09-28 ENCOUNTER — Ambulatory Visit (INDEPENDENT_AMBULATORY_CARE_PROVIDER_SITE_OTHER): Payer: PPO | Admitting: Neurology

## 2019-09-28 ENCOUNTER — Encounter: Payer: Self-pay | Admitting: Neurology

## 2019-09-28 ENCOUNTER — Other Ambulatory Visit: Payer: Self-pay

## 2019-09-28 VITALS — BP 151/99 | HR 79 | Resp 14 | Ht 69.0 in | Wt 170.6 lb

## 2019-09-28 DIAGNOSIS — R05 Cough: Secondary | ICD-10-CM | POA: Diagnosis not present

## 2019-09-28 DIAGNOSIS — R059 Cough, unspecified: Secondary | ICD-10-CM

## 2019-09-28 DIAGNOSIS — G5631 Lesion of radial nerve, right upper limb: Secondary | ICD-10-CM

## 2019-09-28 DIAGNOSIS — R1310 Dysphagia, unspecified: Secondary | ICD-10-CM

## 2019-09-28 DIAGNOSIS — G20 Parkinson's disease: Secondary | ICD-10-CM

## 2019-09-30 ENCOUNTER — Other Ambulatory Visit (HOSPITAL_COMMUNITY): Payer: Self-pay

## 2019-09-30 DIAGNOSIS — R1310 Dysphagia, unspecified: Secondary | ICD-10-CM

## 2019-10-05 ENCOUNTER — Encounter: Payer: Self-pay | Admitting: Neurology

## 2019-10-05 ENCOUNTER — Ambulatory Visit (INDEPENDENT_AMBULATORY_CARE_PROVIDER_SITE_OTHER): Payer: PPO | Admitting: Neurology

## 2019-10-05 ENCOUNTER — Other Ambulatory Visit: Payer: Self-pay

## 2019-10-05 DIAGNOSIS — M5410 Radiculopathy, site unspecified: Secondary | ICD-10-CM

## 2019-10-05 DIAGNOSIS — G5631 Lesion of radial nerve, right upper limb: Secondary | ICD-10-CM | POA: Diagnosis not present

## 2019-10-05 NOTE — Progress Notes (Signed)
Virtual Visit via Video Note The purpose of this virtual visit is to provide medical care while limiting exposure to the novel coronavirus.    Consent was obtained for video visit:  Yes.   Answered questions that patient had about telehealth interaction:  Yes.   I discussed the limitations, risks, security and privacy concerns of performing an evaluation and management service by telemedicine. I also discussed with the patient that there may be a patient responsible charge related to this service. The patient expressed understanding and agreed to proceed.  Pt location: Home Physician Location: office Name of referring provider:  [name], MD I connected with Shane Crutch at patients initiation/request on 10/06/2019 at 10:15 AM EST by video enabled telemedicine application and verified that I am speaking with the correct person using two identifiers. Pt MRN:  [id number] Pt DOB:  10-28-1955 Video Participants:  Shane Crutch;  Wife supplements the history   History of Present Illness:  Patient seen today in follow-up, earlier than expected.  He had his EMG done yesterday.  Results were not as expected.  I talked to Dr. Posey Pronto personally about the results.  She was concerned about possible CIDP.  EMG demonstrated subacute sensorimotor PN.  Needle electromyography was not done on the left.  Dr. Posey Pronto noted that EMG was technically a very difficult study due to the fact that the patient had difficulty relaxing.  Current movement d/o meds:  Carbidopa/levodopa 25/100 CR, up to 9 tablets/day Quetiapine 25 mg twice per day    Current Outpatient Medications on File Prior to Visit  Medication Sig Dispense Refill  . Carbidopa-Levodopa ER (SINEMET CR) 25-100 MG tablet controlled release TAKE 9 TABLETS PER DAY AS DIRECTED 810 tablet 1  . donepezil (ARICEPT) 10 MG tablet TAKE 1 TABLET BY MOUTH EVERYDAY AT BEDTIME 90 tablet 1  . naproxen sodium (ALEVE) 220 MG tablet Take 440 mg by mouth 2 (two)  times daily as needed (for pain.).    Marland Kitchen QUEtiapine (SEROQUEL) 25 MG tablet TAKE 1 TABLET BY MOUTH TWICE A DAY 180 tablet 1  . Tamsulosin HCl (FLOMAX) 0.4 MG CAPS Take 0.4 mg by mouth 2 (two) times daily.      No current facility-administered medications on file prior to visit.     Observations/Objective:   There were no vitals filed for this visit. GEN:  The patient appears stated age and is in NAD.  Neurological examination:  Orientation: The patient is alert and oriented x3. Cranial nerves: There is good facial symmetry. 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  The speech is fluent and clear. Soft palate rises symmetrically and there is no tongue deviation. Hearing is intact to conversational tone.    Assessment and Plan:   1. Parkinsons disease -Continue carbidopa/levodopa 25/100 CR, up to 9 tablets/day (dosed 4 times daily). -Doing somewhat better off of entacapone.  He will stay off of that.   2. EDS -MSLT demonstrated pathologic sleepiness and concern for narcolepy. He didn't meet clinical criteria but would recommend treat as such. Didn't tolerate provigil 200 mg.Had previously referred to [facility] sleep center, but patient/wife did not answer phone and multiple messages were left by [facility] to make an appointment.  3. Nocturia -had appt with Dr. Gilford Rile and had TURP and it really helped.  4. Parkinsons dementia -continue seroquel 25 mg bid.    5.cough with ?dysphagia -Modified barium swallow is pending.  Told him to follow-up with primary care as well to rule out reflux as contributing.  6.  Abnormal EMG with subacute sensorimotor PN  -Discussed with patient and wife that the issue in his right hand is much more widespread than originally thought.  This is not radial neuropathy.  Further testing needs to be done to rule out CIDP.  Discussed with patient/wife that we probably should do a lumbar puncture.   After discussing risk, benefits, and side effects.  They were agreeable.  We will schedule lumbar puncture.  We will do CSF studies to include white blood cells, red blood cells, protein, glucose, oligoclonal bands, IgG index, cytology.  -Patient needs MRI of the cervical spine given hyperreflexia and acute inability to move hand/fingers.  Wife will check with OSH to make sure not already done before we order.  Follow Up Instructions:    -I discussed the assessment and treatment plan with the patient. The patient was provided an opportunity to ask questions and all were answered. The patient agreed with the plan and demonstrated an understanding of the instructions.   The patient was advised to call back or seek an in-person evaluation if the symptoms worsen or if the condition fails to improve as anticipated.    Total time spent on today's visit was 35 minutes, including both face-to-face time and nonface-to-face time.  Time included that spent on review of records (prior notes available to me/labs/imaging if pertinent), discussing treatment and goals, answering patient's questions and coordinating care.   Alonza Bogus, DO

## 2019-10-05 NOTE — Procedures (Signed)
[facility]  [address]  [place] [address], [place] [address] Tel: [phone] Fax:  [phone] Test Date:  10/05/2019  Patient: Adam Cordova DOB: June 24, 1956 Physician: Narda Amber, DO  Sex: Male Height: 5\' 9"  Ref Phys: Alonza Bogus, DO  ID#: [id number] Temp: 32.0C Technician:    Patient Complaints: This is a 64 year old man with Parkinson's disease referred for evaluation of acute onset of right wrist drop.  NCV & EMG Findings: Extensive electrodiagnostic testing of the right upper extremity and additional studies of the left shows:  1. Bilateral median and ulnar sensory responses are absent.  Bilateral radial sensory responses show reduced amplitude (R8.4, L6.1 V) and the latency is prolonged on the left side only (3.9 ms). 2. Bilateral median motor responses show prolonged latency (R5.5, L6.4 ms) and slowed conduction velocity in the forearm (44-45 m/s).  Additionally, the left median motor reduced is reduced (3.8 mV).  Bilateral ulnar motor responses show prolonged latency (R4.1, L4.4 ms), slowed conduction velocity across the course of the nerve (26 - 47 m/s), and conduction block in the forearm on the right along with temporal dispersion.  Bilateral radial motor responses are within normal limits. 3. Right ulnar F wave is absent. 4. Needle electrode examination was technically challenging to perform due to inability to relax and cognitive deficits.  There is a generalized pattern of incomplete motor unit recruitment as seen by variable activation; this may be due to poor effort or central disorder of motor unit control.  Chronic motor axonal loss changes are seen affecting the intrinsic hand muscles, which is severe in the extensor indices proprius where there is also fibrillation potential.  Chronic motor axonal loss changes are also present in the pronator teres and extensor carpi radialis longus muscles.  Fibrillation potentials were not clearly present in the cervical  paraspinal muscles, however patient did have difficulty trying to relax.  Impression: This is a complex and technically challenging study.  Findings suggest a subacute sensorimotor polyneuropathy, with demyelinating and axonal features, affecting the upper extremities.     ___________________________ Narda Amber, DO    Nerve Conduction Studies Anti Sensory Summary Table   Site NR Peak (ms) Norm Peak (ms) P-T Amp (V) Norm P-T Amp  Left Median Anti Sensory (2nd Digit)  32C  Wrist NR  <3.8  >10  Right Median Anti Sensory (2nd Digit)  32C  Wrist NR  <3.8  >10  Left Radial Anti Sensory (Base 1st Digit)  32C  Wrist    3.9 <2.8 6.1 >10  Right Radial Anti Sensory (Base 1st Digit)  32C  Wrist    2.7 <2.8 8.4 >10  Left Ulnar Anti Sensory (5th Digit)  32C  Wrist NR  <3.2  >5  Right Ulnar Anti Sensory (5th Digit)  32C  Wrist NR  <3.2  >5   Motor Summary Table   Site NR Onset (ms) Norm Onset (ms) O-P Amp (mV) Norm O-P Amp Site1 Site2 Delta-0 (ms) Dist (cm) Vel (m/s) Norm Vel (m/s)  Left Median Motor (Abd Poll Brev)  32C  Wrist    6.4 <4.0 3.8 >5 Elbow Wrist 6.8 29.0 43 >50  Elbow    13.2  2.2         Right Median Motor (Abd Poll Brev)  32C  Wrist    5.5 <4.0 5.7 >5 Elbow Wrist 6.6 29.0 44 >50  Elbow    12.1  4.3         Left Radial Motor (Ext  Ind Prop)  32C  7cm    2.3 <3.1 6.1 >5 ACF 7cm 1.5 8.0 53 >50  ACF    3.8  5.9  B-spiral groove ACF 1.4 8.0 57   B-spiral groove    5.2  5.8  A-spiral groove B-spiral groove 0.7 3.0 43   A-spiral groove    5.9  5.0         Right Radial Motor (Ext Ind Prop)  32C  7cm    2.8 <3.1 5.3 >5 ACF 7cm 2.1 11.0 52 >50  ACF    4.9  4.4  ^-Spiral Groove ACF 1.4 9.0 64   B-Spiral Groove    6.3  4.4  A-spiral groove B-Spiral Groove 0.7 3.0 43   A-spiral groove    7.0  4.4         Left Ulnar Motor (Abd Dig Minimi)  32C  Wrist    4.4 <3.1 7.8 >7 B Elbow Wrist 5.3 22.0 42 >50  B Elbow    9.7  7.0  A Elbow B Elbow 3.4 10.0 29 >50  A Elbow    13.1   5.9         Right Ulnar Motor (Abd Dig Minimi)  32C  Wrist    4.1 <3.1 7.0 >7 B Elbow Wrist 4.7 22.0 47 >50  B Elbow    8.8  3.5  A Elbow B Elbow 3.8 10.0 26 >50  A Elbow    12.6  3.5          F Wave Studies   NR F-Lat (ms) Lat Norm (ms) L-R F-Lat (ms)  Right Ulnar (Mrkrs) (Abd Dig Min)  32C  NR  <33    EMG   Side Muscle Ins Act Fibs Psw Fasc Number Recrt Dur Dur. Amp Amp. Poly Poly. Comment  Right 1stDorInt Nml Nml Nml Nml 1- Mod-R Some 1+ Some 1+ Some 1+ N/A  Right ABD Dig Min Nml Nml Nml Nml 2- Mod-R Many 1+ Many 1+ Many 1+ N/A  Right Ext Indicis Nml 1+ Nml Nml SMU Mod-R All 1+ All 1+ All 1+ N/A  Right ABD Pol Brevis Nml Nml Nml Nml 2- Mod-R Many 1+ Many 1+ Many 1+ N/A  Right PronatorTeres Nml Nml Nml Nml 2- Mod-R Many 1+ Many 1+ Many 1+ N/A  Right Biceps Nml Nml Nml Nml 2- Mod-V Nml Nml Nml Nml Nml Nml N/A  Right Triceps Nml Nml Nml Nml 2- Mod-V Nml Nml Nml Nml Nml Nml N/A  Right Deltoid Nml Nml Nml Nml 2- Mod-V Nml Nml Nml Nml Nml Nml N/A  Right Cervical Parasp Low Nml Nml Nml Nml NE Nml Nml Nml Nml Nml Nml Nml N/A  Right ExtCarRadLong Nml Nml Nml Nml 3- Mod-R Many 1+ Many 1+ Many 1+ N/A  Right BrachioRad Nml Nml Nml Nml 3- Mod-R Nml Nml Nml Nml Nml Nml N/A      Waveforms:

## 2019-10-06 ENCOUNTER — Other Ambulatory Visit: Payer: Self-pay

## 2019-10-06 ENCOUNTER — Telehealth (INDEPENDENT_AMBULATORY_CARE_PROVIDER_SITE_OTHER): Payer: PPO | Admitting: Neurology

## 2019-10-06 ENCOUNTER — Ambulatory Visit (HOSPITAL_COMMUNITY)
Admission: RE | Admit: 2019-10-06 | Discharge: 2019-10-06 | Disposition: A | Discharge status: Home / Self Care | Payer: PPO | Source: Ambulatory Visit | Attending: Neurology | Admitting: Neurology

## 2019-10-06 ENCOUNTER — Telehealth: Payer: Self-pay | Admitting: Neurology

## 2019-10-06 DIAGNOSIS — G20 Parkinson's disease: Secondary | ICD-10-CM

## 2019-10-06 DIAGNOSIS — R292 Abnormal reflex: Secondary | ICD-10-CM

## 2019-10-06 DIAGNOSIS — G6181 Chronic inflammatory demyelinating polyneuritis: Secondary | ICD-10-CM

## 2019-10-06 DIAGNOSIS — R1310 Dysphagia, unspecified: Secondary | ICD-10-CM

## 2019-10-06 NOTE — Telephone Encounter (Signed)
Order placed and message left for his wife

## 2019-10-06 NOTE — Telephone Encounter (Signed)
Loma Sousa, this is the one we discussed earlier.  Needs MRI cervical spine.  Dx:  hyperreflexia

## 2019-10-06 NOTE — Telephone Encounter (Signed)
Patient's wife called regarding [place] and him having a CT of his head but nothing to the cervical spine. Please Call. Thank you

## 2019-10-08 MED ORDER — DIAZEPAM 5 MG PO TABS: ORAL_TABLET | ORAL | 0 refills | Status: DC

## 2019-10-11 ENCOUNTER — Ambulatory Visit
Admission: RE | Admit: 2019-10-11 | Discharge: 2019-10-11 | Disposition: A | Discharge status: Home / Self Care | Payer: PPO | Source: Ambulatory Visit | Attending: Neurology | Admitting: Neurology

## 2019-10-11 ENCOUNTER — Other Ambulatory Visit (HOSPITAL_COMMUNITY)
Admission: RE | Admit: 2019-10-11 | Discharge: 2019-10-11 | Disposition: A | Discharge status: Home / Self Care | Payer: PPO | Source: Ambulatory Visit | Attending: Neurology | Admitting: Neurology

## 2019-10-11 VITALS — BP 170/100 | HR 73

## 2019-10-11 DIAGNOSIS — G6181 Chronic inflammatory demyelinating polyneuritis: Secondary | ICD-10-CM | POA: Insufficient documentation

## 2019-10-11 DIAGNOSIS — G47429 Narcolepsy in conditions classified elsewhere without cataplexy: Secondary | ICD-10-CM

## 2019-10-11 DIAGNOSIS — G20A1 Parkinson's disease without dyskinesia, without mention of fluctuations: Secondary | ICD-10-CM

## 2019-10-11 DIAGNOSIS — F0280 Dementia in other diseases classified elsewhere without behavioral disturbance: Secondary | ICD-10-CM

## 2019-10-11 DIAGNOSIS — G20 Parkinson's disease: Secondary | ICD-10-CM | POA: Insufficient documentation

## 2019-10-11 NOTE — Discharge Instructions (Signed)

## 2019-10-11 NOTE — Progress Notes (Signed)
One SST tube of blood drawn from right [facility] space without difficulty for LP labs by Robert Bellow, RN; site unremarkable.

## 2019-10-12 LAB — CYTOLOGY - NON PAP

## 2019-10-18 NOTE — Telephone Encounter (Signed)
Mychart message sent to pt re: labs.  If they are able to come for labs, please order ESR, CRP, ANA, SSA/SSB, cryo, B12, folate.  Dx:  Parkinsons Disease, dementia, memory loss, elevated CSF protein, encephalopathy unspecified, other general signs and symptoms, demyelinating polyradiculopathy

## 2019-10-20 LAB — GLUCOSE, CSF: Glucose, CSF: 61 mg/dL (ref 40–80)

## 2019-10-20 LAB — CSF CELL COUNT WITH DIFFERENTIAL
RBC Count, CSF: 1 cells/uL — ABNORMAL HIGH
WBC, CSF: 0 cells/uL (ref 0–5)

## 2019-10-20 LAB — CNS IGG SYNTHESIS RATE, CSF+BLOOD
Albumin Serum: 4.3 g/dL (ref 3.2–4.6)
Albumin, CSF: 40.9 mg/dL (ref 8.0–42.0)
CNS-IgG Synthesis Rate: 0.1 mg/24 h (ref <=3.3)
IgG (Immunoglobin G), Serum: 989 mg/dL (ref 600–1540)
IgG Total CSF: 4.9 mg/dL (ref 0.8–7.7)
IgG-Index: 0.52 (ref <0.66)

## 2019-10-20 LAB — MYELIN BASIC PROTEIN, CSF: Myelin Basic Protein: <2 mcg/L (ref 2.0–4.0)

## 2019-10-20 LAB — ANGIOTENSIN CONVERTING ENZYME, CSF: ACE, CSF: 3 U/L (ref <=15)

## 2019-10-20 LAB — PROTEIN, CSF: Total Protein, CSF: 71 mg/dL — ABNORMAL HIGH (ref 15–60)

## 2019-10-20 LAB — OLIGOCLONAL BANDS, CSF + SERM

## 2019-10-25 ENCOUNTER — Other Ambulatory Visit: Payer: Self-pay

## 2019-10-25 ENCOUNTER — Ambulatory Visit: Payer: PPO | Attending: Internal Medicine

## 2019-10-25 DIAGNOSIS — Z23 Encounter for immunization: Secondary | ICD-10-CM | POA: Insufficient documentation

## 2019-10-25 NOTE — Progress Notes (Signed)
   Covid-19 Vaccination Clinic  Name:  Adam Cordova    MRN: [id number] DOB: [birth year]  10/25/2019  Adam Cordova was observed post Covid-19 immunization for 15 minutes without incidence. He was provided with Vaccine Information Sheet and instruction to access the V-Safe system.   Adam Cordova was instructed to call 911 with any severe reactions post vaccine: Marland Kitchen Difficulty breathing  . Swelling of your face and throat  . A fast heartbeat  . A bad rash all over your body  . Dizziness and weakness    Immunizations Administered    Name Date Dose VIS Date Route   Pfizer COVID-19 Vaccine 10/25/2019 10:52 AM 0.3 mL 08/13/2019 Intramuscular   Manufacturer: [place]   Lot: [id number]   [place]: [id number]

## 2019-10-26 ENCOUNTER — Other Ambulatory Visit: Payer: Self-pay

## 2019-10-26 DIAGNOSIS — L219 Seborrheic dermatitis, unspecified: Secondary | ICD-10-CM | POA: Diagnosis not present

## 2019-10-26 DIAGNOSIS — F0280 Dementia in other diseases classified elsewhere without behavioral disturbance: Secondary | ICD-10-CM | POA: Diagnosis not present

## 2019-10-26 DIAGNOSIS — Z86008 Personal history of in-situ neoplasm of other site: Secondary | ICD-10-CM | POA: Diagnosis not present

## 2019-10-26 DIAGNOSIS — L570 Actinic keratosis: Secondary | ICD-10-CM | POA: Diagnosis not present

## 2019-10-26 DIAGNOSIS — L821 Other seborrheic keratosis: Secondary | ICD-10-CM | POA: Diagnosis not present

## 2019-10-26 DIAGNOSIS — D225 Melanocytic nevi of trunk: Secondary | ICD-10-CM | POA: Diagnosis not present

## 2019-10-26 DIAGNOSIS — Z85828 Personal history of other malignant neoplasm of skin: Secondary | ICD-10-CM | POA: Diagnosis not present

## 2019-10-26 DIAGNOSIS — G20 Parkinson's disease: Secondary | ICD-10-CM | POA: Diagnosis not present

## 2019-10-26 DIAGNOSIS — R838 Other abnormal findings in cerebrospinal fluid: Secondary | ICD-10-CM

## 2019-10-26 DIAGNOSIS — R6889 Other general symptoms and signs: Secondary | ICD-10-CM

## 2019-10-26 DIAGNOSIS — G9340 Encephalopathy, unspecified: Secondary | ICD-10-CM

## 2019-10-27 ENCOUNTER — Other Ambulatory Visit: Payer: PPO

## 2019-11-02 ENCOUNTER — Ambulatory Visit
Admission: RE | Admit: 2019-11-02 | Discharge: 2019-11-02 | Disposition: A | Discharge status: Home / Self Care | Payer: PPO | Source: Ambulatory Visit | Attending: Neurology | Admitting: Neurology

## 2019-11-02 DIAGNOSIS — R292 Abnormal reflex: Secondary | ICD-10-CM

## 2019-11-02 DIAGNOSIS — M4802 Spinal stenosis, cervical region: Secondary | ICD-10-CM | POA: Diagnosis not present

## 2019-11-02 LAB — ANA: Anti Nuclear Antibody (ANA): NEGATIVE

## 2019-11-02 LAB — SEDIMENTATION RATE: Sed Rate: 9 mm/h (ref 0–20)

## 2019-11-02 LAB — CRYOGLOBULIN: Cryoglobulin, Qualitative Analysis: NOT DETECTED

## 2019-11-02 LAB — C-REACTIVE PROTEIN: CRP: 1.3 mg/L (ref <8.0)

## 2019-11-02 LAB — SJOGRENS SYNDROME-A EXTRACTABLE NUCLEAR ANTIBODY: SSA (Ro) (ENA) Antibody, IgG: <1 AI

## 2019-11-02 LAB — FOLATE: Folate: 6.4 ng/mL

## 2019-11-02 LAB — SJOGRENS SYNDROME-B EXTRACTABLE NUCLEAR ANTIBODY: SSB (La) (ENA) Antibody, IgG: <1 AI

## 2019-11-02 LAB — VITAMIN B12: Vitamin B-12: 226 pg/mL (ref 200–1100)

## 2019-11-03 ENCOUNTER — Other Ambulatory Visit: Payer: PPO

## 2019-11-07 ENCOUNTER — Other Ambulatory Visit: Payer: Self-pay | Admitting: Neurology

## 2019-11-08 ENCOUNTER — Ambulatory Visit: Payer: PPO | Admitting: Neurology

## 2019-11-09 DIAGNOSIS — R3914 Feeling of incomplete bladder emptying: Secondary | ICD-10-CM | POA: Diagnosis not present

## 2019-11-09 DIAGNOSIS — N323 Diverticulum of bladder: Secondary | ICD-10-CM | POA: Diagnosis not present

## 2019-11-09 DIAGNOSIS — N401 Enlarged prostate with lower urinary tract symptoms: Secondary | ICD-10-CM | POA: Diagnosis not present

## 2019-11-09 DIAGNOSIS — N200 Calculus of kidney: Secondary | ICD-10-CM | POA: Diagnosis not present

## 2019-11-10 DIAGNOSIS — S60511A Abrasion of right hand, initial encounter: Secondary | ICD-10-CM | POA: Diagnosis not present

## 2019-11-10 DIAGNOSIS — S60221A Contusion of right hand, initial encounter: Secondary | ICD-10-CM | POA: Diagnosis not present

## 2019-11-10 DIAGNOSIS — W19XXXA Unspecified fall, initial encounter: Secondary | ICD-10-CM | POA: Diagnosis not present

## 2019-11-12 ENCOUNTER — Ambulatory Visit: Payer: PPO | Admitting: Neurology

## 2019-11-12 ENCOUNTER — Other Ambulatory Visit: Payer: Self-pay

## 2019-11-12 ENCOUNTER — Encounter: Payer: Self-pay | Admitting: Neurology

## 2019-11-12 ENCOUNTER — Other Ambulatory Visit (INDEPENDENT_AMBULATORY_CARE_PROVIDER_SITE_OTHER): Payer: PPO

## 2019-11-12 VITALS — BP 119/81 | HR 81 | Ht 69.0 in | Wt 174.0 lb

## 2019-11-12 DIAGNOSIS — E538 Deficiency of other specified B group vitamins: Secondary | ICD-10-CM

## 2019-11-12 DIAGNOSIS — G6181 Chronic inflammatory demyelinating polyneuritis: Secondary | ICD-10-CM | POA: Diagnosis not present

## 2019-11-12 NOTE — Patient Instructions (Addendum)
Check labs Your provider has requested that you have labwork completed today. Please go to [facility] ([id number]) on the second floor of this building before leaving the office today. You do not need to check in. If you are not called within 15 minutes please check with the front desk.   Start IVIG through [facility].  Their company will contact you with details  Start home occupational therapy and physical therapy.  You should be contacted by the company to set up appointments.

## 2019-11-12 NOTE — Progress Notes (Signed)
[facility] Clinic Note - Initial Visit   Date: 11/12/19  Adam Cordova MRN: [id number] DOB: 06-08-1956   Dear Dr. Carles Collet:  Thank you for your kind referral of Adam Cordova for consultation of neuropathy. Although his history is well known to you, please allow [place] to reiterate it for the purpose of our medical record. The patient was accompanied to the clinic by wife who also provides collateral information.     History of Present Illness: Adam Cordova is a 64 y.o. right-handed male with parkinson's disease presenting for evaluation of right hand weakness.  Starting in early January, he developed gradual onset of right hand weakness with inability to extend his fingers and wrist.  Wife become more aware of his hand weakness, when he was unable to move things with his fingers and instead used the back of his hand to push objects.  He denies any numbness/tingling of the hands or feet.  He saw Dr. Carles Collet for these symptoms who had high clinical suspicion for right radial neuropathy and ordered NCS/EMG.  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  Serology testing was unremarkable except low-normal vitamin B12 for which he started oral supplements.  CSF testing shows normal cell cound with elevated protein (71).  IgG index was normal.  He is here for my opinion in management of polyradiculoneuropathy.    Out-side paper records, electronic medical record, and images have been reviewed where available and summarized as:  NCS/EMG of the upper extremities 10/05/19:  This is a complex and technically challenging study.  Findings suggest a subacute sensorimotor polyneuropathy, with demyelinating and axonal features, affecting the upper extremities.    Labs 10/26/2019: ESR 9, CRP 1.3,  Folate 6.4, vitmain B12 226*, SSA/B neg, ANA, neg, cryoglobulin  neg CSF 10/11/2019:  R1 W0 P71* G61  IgG index 0.52, MBP < 2.0, ACE neg, cytology neg, OCB > 5 in CSF and serum  MRI cervical spine 11/02/2019:  1. Mild cervical spine spondylosis as described above. 2.  No acute osseous injury of the cervical spine.     Past Medical History:  Diagnosis Date  . BPH (benign prostatic hypertrophy) with urinary retention   . Foley catheter in place   . Parkinson disease [facility])    neurologist--  dr Richardean Chimera  in [place]  . 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 UTI (urinary tract infection)     Past Surgical History:  Procedure Laterality Date  . TRANSURETHRAL RESECTION OF PROSTATE N/A 11/23/2012   Procedure: TRANSURETHRAL RESECTION OF THE PROSTATE WITH GYRUS INSTRUMENTS;  Surgeon: Bernestine Amass, MD;  Location: [facility];  Service: Urology;  Laterality: N/A;  . TRANSURETHRAL RESECTION OF PROSTATE N/A 07/09/2017   Procedure: TRANSURETHRAL RESECTION OF THE PROSTATE (TURP), BIPOLAR;  Surgeon: Ceasar Mons, MD;  Location: [facility];  Service: Urology;  Laterality: N/A;     Medications:  Outpatient Encounter Medications as of 11/12/2019  Medication Sig  . Carbidopa-Levodopa ER (SINEMET CR) 25-100 MG tablet controlled release TAKE 9 TABLETS PER DAY AS DIRECTED  . Cyanocobalamin (B-12 PO) Take by mouth daily.  Marland Kitchen donepezil (ARICEPT) 10 MG tablet TAKE 1 TABLET BY MOUTH EVERYDAY AT BEDTIME  . naproxen sodium (ALEVE) 220 MG tablet Take 440 mg by mouth 2 (two) times daily as needed (for pain.).  Marland Kitchen QUEtiapine (SEROQUEL) 25 MG tablet TAKE 1 TABLET BY MOUTH TWICE  A DAY  . Tamsulosin HCl (FLOMAX) 0.4 MG CAPS Take 0.4 mg by mouth 2 (two) times daily.   . [DISCONTINUED] diazepam (VALIUM) 5 MG tablet One tab one hour prior to procedure; repeat 30 min prior to procedure   No facility-administered encounter medications on file as of 11/12/2019.    Allergies:  Allergies  Allergen Reactions  . Bee Venom Anaphylaxis  . Levaquin [Levofloxacin  Hemihydrate] Anaphylaxis and Swelling  . Sertraline Diarrhea and Nausea Only    Family History: Family History  Problem Relation Age of Onset  . Dementia Mother   . Neuropathy Mother   . Heart disease Father   . Heart disease Brother     Social History: Social History   Tobacco Use  . Smoking status: Never Smoker  . Smokeless tobacco: Never Used  Substance Use Topics  . Alcohol use: Yes    Comment: 7-10 glasses of wine a week  . Drug use: No   Social History   Social History Narrative   Right handed   Lives in a two story home with wife and dog    Vital Signs:  BP 119/81   Pulse 81   Ht '5\' 9"'  (1.753 m)   Wt 174 lb (78.9 kg)   SpO2 98%   BMI 25.70 kg/m   Neurological Exam: MENTAL STATUS including orientation to time, place, person, recent and remote memory, attention span and concentration, language, and fund of knowledge is fairly intact.  Speech is not dysarthric.  Blunted affect.  Answers questions appropriately.   CRANIAL NERVES: Pupils equal round.  Vertical gaze is restricted bilaterally, side-to-side movements intact.  No nystagmus.  No ptosis.  Normal head rotation.   MOTOR:  Moderate atrophy over the wrist extensors on the right.  No fasciculations.  No abnormal movements.   Upper Extremity:  Right  Left  Deltoid  5/5   5/5   Biceps  5/5   5/5   Triceps  5/5   5/5   Infraspinatus 5/5  5/5  Medial pectoralis 5/5  5/5  Wrist extensors  5/5   5/5   Wrist flexors  2/5   5/5   Finger extensors  2/5   4/5   Finger flexors  5/5   5/5   Dorsal interossei  5/5   5/5   Abductor pollicis  5/5   5/5   Tone (Ashworth scale)  0  0   Lower Extremity:  Right  Left  Hip flexors  5/5   5/5   Hip extensors  5/5   5/5   Adductor 5/5  5/5  Abductor 5/5  5/5  Knee flexors  5/5   5/5   Knee extensors  5/5   5/5   Dorsiflexors  5/5   5/5   Plantarflexors  5/5   5/5   Toe extensors  5/5   5/5   Toe flexors  5/5   5/5   Tone (Ashworth scale)  0  0    MSRs:  Right        Left                  brachioradialis 2+  2+  biceps 2+  2+  triceps 2+  2+  patellar 0  0  ankle jerk 0  0   SENSORY:  Vibration absent distal to ankles bilaterally, intact at the knees and MCP.  Temperature and pin prixk intact throughout.  COORDINATION/GAIT: Dysmetria with left finger-to- nose-finger.  Finger tapping  is slowed and shows reduced amplitude bilaterally.  Gait is assisted with one person, appears unsteady with mild ataxia.   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  Clinically, he reports only right wrist drop and denies paresthesias, although EMG shows absent sensory responses involving bilateral ulnar and median nerves as well as reduced radial responses.  Bilateral median and ulnar motor responses show demyelinating changes with conduction block in the right ulnar nerve.  I discussed the nature of an inflammatory polyneuropathy/polyradiculoneuropathy and management options include IV methylprednisolone vs IVIG.  Because he has history of behavior changes and hallucinations currently being managed on seroquel 40m BID, it was mutually decided to proceed with IVIG.  Risks and benefits of IVIG discussed. - Start home infusion IVIG 2g/kg over 2-5 days as induction, followed by 1g/kg every 28 days - Start home OT for hand strengthening - Start home PT for gait training  Vitamin B12 deficiency - Check vitamin B12, MMA to be sure his levels are trending up with PO supplementation, otherwise transition to injections  Return to clinic in 5 months.  Total time spent with history, exam, reviewing records, documentation, and coordination of care on day of encounter: 50 min   Thank you for allowing me to participate in patient's care.  If I can answer any additional questions, I would be pleased to do so.    Sincerely,    Jesilyn Easom K. PPosey Pronto DO

## 2019-11-16 ENCOUNTER — Ambulatory Visit: Payer: PPO | Attending: Internal Medicine

## 2019-11-16 ENCOUNTER — Telehealth: Payer: Self-pay

## 2019-11-16 DIAGNOSIS — Z23 Encounter for immunization: Secondary | ICD-10-CM

## 2019-11-16 LAB — VITAMIN B12: Vitamin B-12: 358 pg/mL (ref 200–1100)

## 2019-11-16 LAB — METHYLMALONIC ACID, SERUM: Methylmalonic Acid, Quant: 221 nmol/L (ref 87–318)

## 2019-11-16 NOTE — Telephone Encounter (Signed)
Started request for IVIG. Faxed to Parks Ranger at [facility]

## 2019-11-16 NOTE — Telephone Encounter (Signed)
-----   Message from Alda Berthold, DO sent at 11/12/2019  5:08 PM EST ----- I need to start IVIG with [facility] on this patient.See if you have the initial start form, otherwise will need to request from [place].  Thanks.

## 2019-11-16 NOTE — Progress Notes (Signed)
   Covid-19 Vaccination Clinic  Name:  Adam Cordova    MRN: [id number] DOB: 1955-11-08  11/16/2019  Adam Cordova was observed post Covid-19 immunization for 15 minutes without incident. He was provided with Vaccine Information Sheet and instruction to access the V-Safe system.   Adam Cordova was instructed to call 911 with any severe reactions post vaccine: Marland Kitchen Difficulty breathing  . Swelling of face and throat  . A fast heartbeat  . A bad rash all over body  . Dizziness and weakness   Immunizations Administered    Name Date Dose VIS Date Route   Pfizer COVID-19 Vaccine 11/16/2019 10:50 AM 0.3 mL 08/13/2019 Intramuscular   Manufacturer: [place]   Lot: [id number]   [place]: [id number]

## 2019-11-18 NOTE — Progress Notes (Unsigned)
IVIG ordered from [facility] to start process.Confirmed she did receive and will order.

## 2019-12-09 ENCOUNTER — Telehealth: Payer: Self-pay | Admitting: Neurology

## 2019-12-09 NOTE — Telephone Encounter (Signed)
I'm guessing that this is IVIG that Dr. Posey Pronto prescribes

## 2019-12-09 NOTE — Telephone Encounter (Signed)
Ria Comment from [facility] called and left a message requesting a call back about an open PA. No medication name left on message.  reference # [id number]

## 2019-12-09 NOTE — Telephone Encounter (Signed)
Sent message to Parks Ranger at [facility] to call.

## 2019-12-13 ENCOUNTER — Ambulatory Visit: Payer: PPO | Admitting: Neurology

## 2019-12-20 NOTE — Telephone Encounter (Signed)
christy please place order for palliative care for Parkinsons Disease

## 2019-12-21 ENCOUNTER — Other Ambulatory Visit: Payer: Self-pay

## 2019-12-21 DIAGNOSIS — G20 Parkinson's disease: Secondary | ICD-10-CM

## 2019-12-21 DIAGNOSIS — F0280 Dementia in other diseases classified elsewhere without behavioral disturbance: Secondary | ICD-10-CM

## 2019-12-22 ENCOUNTER — Telehealth: Payer: Self-pay | Admitting: Neurology

## 2019-12-22 NOTE — Telephone Encounter (Signed)
Unable to get thru.

## 2019-12-22 NOTE — Telephone Encounter (Signed)
Christine from [facility] called and left a message requesting a call back about this patient's denial.

## 2019-12-22 NOTE — Telephone Encounter (Signed)
Currently trying to get with [facility] on this patient.

## 2019-12-24 ENCOUNTER — Telehealth: Payer: Self-pay

## 2019-12-24 NOTE — Telephone Encounter (Signed)
Spoke to pt wife about the IVIG medication change and that we had faxed everything back and that they will be in contact with her.

## 2019-12-29 ENCOUNTER — Other Ambulatory Visit: Payer: PPO | Admitting: *Deleted

## 2019-12-29 ENCOUNTER — Other Ambulatory Visit: Payer: PPO

## 2019-12-29 ENCOUNTER — Other Ambulatory Visit: Payer: Self-pay

## 2019-12-29 DIAGNOSIS — Z515 Encounter for palliative care: Secondary | ICD-10-CM

## 2019-12-29 NOTE — Progress Notes (Signed)
COMMUNITY PALLIATIVE CARE SW NOTE  PATIENT NAME: Adam Cordova DOB: [birth year] MRN: [id number]  PRIMARY CARE PROVIDER: [name], MD  RESPONSIBLE PARTY:  Acct ID - Guarantor Home Phone Work Phone Relationship Acct Type  [id number] CREWE, [id number]  Self P/F     [place], [place] [address]     PLAN OF CARE and INTERVENTIONS:             1. GOALS OF CARE/ ADVANCE CARE PLANNING: Goal of care is to keep patient safely in his home with his wife. Patent is a DNR, form left in the home.  2. SOCIAL/EMOTIONAL/SPIRITUAL ASSESSMENT/ INTERVENTIONS: SW and RN-M.Howard completed face-to-face visit with patient and his wife-Adam Cordova at their home. Patient was sitting. He was alert and oriented to self and situation. He was engaged, but had difficulty recalling events, words and family members. His wife provided a status update on patient's overall medical and functional condition, as well as, his social history. Patient occasionally tried to contribute, but sat quietly and appeared to be listening. Patient requires assistance with ADL's. He can feed himself, but has difficulty gripping and picking up his food. His swallow test indicated that he does have aspiration and his wife cut up his foods to swallow better. His liquid intake is good, but does cough with thin liquids. Patient's weight has been steady, but he has loss muscle mass. Patient has had several falls, particularly in the shower due to progression of parkinson's. Patient had a fall as recent as yesterday. PCG was tearful and expressed frustration and caregiver fatigue. She is requesting assistance with getting respite and in-home help with patient. She desires to attend a wedding in July and would like to have services in place by then. Patient and his wife will be going to their beach home for three weeks and will contact the team when they return. Patient was born in [place], [place]. He graduated high school  and has no Careers adviser. He owned a Chief Strategy Officer. Patient has been married for 34 years and have two children. Patient was raised [place], but have no religious affiliation at this time.   SW provided education regarding the palliative care program and SW role in care, supportive presence, active and supportive listening, obtained social history, assessed needs and comfort of patient and coping of PCG, reassurance of support and resource sharing.  3. PATIENT/CAREGIVER EDUCATION/ COPING: PCG is suffering from caregiver fatigue-counseling and resources will be provided to PCG. 4. PERSONAL EMERGENCY PLAN: 911 can be activated for any emergencies. 5. COMMUNITY RESOURCES COORDINATION/ HEALTH CARE NAVIGATION:  PCG would like assistance with obtaining respite/in-home help with patient. She had in-home services in place before and found them helpful 6. FINANCIAL/LEGAL CONCERNS/INTERVENTIONS: No financial or legal concerns. Patient has a DNR(additional copy left in the home). His wife serves as his POA/HCPOA. He has a living will. Education was provided on the MOST form and copy was left for patient/PCG review.      SOCIAL HX:  Social History   Tobacco Use  . Smoking status: Never Smoker  . Smokeless tobacco: Never Used  Substance Use Topics  . Alcohol use: Yes    Comment: 7-10 glasses of wine a week    CODE STATUS:   Code Status: Prior DNR ADVANCED DIRECTIVES: N MOST FORM COMPLETE: No HOSPICE EDUCATION PROVIDED: Yes  PPS: Patient can ambulate short distances,but utilizes a Glynn to ambulate. He is a high risk due to complications and progression of  his parkinson's disease.   The duration of the visit was 60 minutes with patient/PCG.       [address] [place], [place]

## 2019-12-31 ENCOUNTER — Telehealth: Payer: Self-pay

## 2019-12-31 NOTE — Telephone Encounter (Signed)
(  Late Entry)  SW scheduled a home visit for 12/29/19@ 12:30p, with his wife [name].

## 2020-01-02 NOTE — Progress Notes (Signed)
COMMUNITY PALLIATIVE CARE RN NOTE  PATIENT NAME: Adam Cordova DOB: [birth year] MRN: [id number]  PRIMARY CARE PROVIDER: [name], MD  RESPONSIBLE PARTY:  Acct ID - Guarantor Home Phone Work Phone Relationship Acct Type  [id number] HUAN, POLLOK* [phone]  Self P/F     [address] [place], [place] [address]   Covid-19 Pre-screening Negative  PLAN OF CARE and INTERVENTION:  1. ADVANCE CARE PLANNING/GOALS OF CARE: Goal is for patient to remain at home with his wife and avoid hospitalizations. 2. PATIENT/CAREGIVER EDUCATION: Explained Palliative care services, symptom management, safe mobility 3. DISEASE STATUS: Joint visit made with Palliative care SW, Monica Lonon. Met with patient and his wife in their home. Upon arrival, patient is sitting up on the couch awake and alert. He is able to answer simple questions, but does not engage much in conversation. He denies pain at this time. No dyspnea noted or reported. He requires assistance with bathing, dressing, toileting, feeding, transfers and ambulation. He ambulates using a Schafer and 1 person assistance. He is a high fall risk. He falls about 2-3x/week and sometimes more. Last fall was yesterday. Wife reports that he is losing function in both his legs and arms. He is currently working with PT. Wife has issues giving patient a shower. He has a shower chair, but when he sits, he flops down and the chair almost tips over. Was able to show her shower chairs with suction cups at the bottom to see if this will help. She is also going to get grab bars installed. His appetite is good. He feels that it would be better if he had more control of his hands while eating. He does experience some coughing during meals. He has a swallow study done in February which showed slight aspiration and was advised to clear his throat often while eating. His wife cuts up his meats. He also has special bowls where he is more able to scoop the food out. He is  intermittently incontinent of both bowel and bladder and wears Depends. He is sleeping "pretty good" during the night. He says that he has been having repeat dreams, but they do not distress him. He does experience some hallucinations at times. Recently he said he saw a "gremlin" in the living room. Since starting Seroquel, his hallucinations have improved and dreams are no longer scary. Wife speaks about patient not only having Parkison's disease, but has also recently been diagnosed with an autoimmune disease as well. They are awaiting approval through their insurance for IVIG treatment. They are leaving tomorrow to [facility] for about a month. She says that when she returns, she is going to get in contact with [facility] to receive additional help in the home. Wife is having problems with caregiver role strain and feels that she needs more assistance with him and how tiring this role is for her. SW to check on respite options for wife for when she returns back home. She is agreeable to future visits with Palliative care. Will continue to monitor.  HISTORY OF PRESENT ILLNESS: This is a 64 yo male with a diagnosis of Parkinson's disease. Palliative care team asked to follow patient for additional support. Will visit patient monthly and PRN.   CODE STATUS: DNR (left 2 forms in the home as requested) ADVANCED DIRECTIVES: Y MOST FORM: no PPS: 40%   PHYSICAL EXAM:   LUNGS: clear to auscultation  CARDIAC: Cor RRR EXTREMITIES: No edema SKIN: Exposed skin is dry and intact  NEURO: Alert and oriented x 2, intermittent confusion, mild hallucinations, generalized weakness, ambulatory w/Noteboom, unsteady gait   (Duration of visit and documentation 75 minutes)   Daryl Eastern, RN BSN

## 2020-01-09 ENCOUNTER — Other Ambulatory Visit: Payer: Self-pay | Admitting: Neurology

## 2020-01-21 NOTE — Progress Notes (Unsigned)
Parks Ranger called from [facility] is approved and patient has been out of town,he will start treatment On May 25th, 2021. Sent information to scan

## 2020-01-25 DIAGNOSIS — G6181 Chronic inflammatory demyelinating polyneuritis: Secondary | ICD-10-CM | POA: Diagnosis not present

## 2020-01-26 DIAGNOSIS — G6181 Chronic inflammatory demyelinating polyneuritis: Secondary | ICD-10-CM | POA: Diagnosis not present

## 2020-01-27 DIAGNOSIS — G6181 Chronic inflammatory demyelinating polyneuritis: Secondary | ICD-10-CM | POA: Diagnosis not present

## 2020-01-28 ENCOUNTER — Other Ambulatory Visit: Payer: Self-pay

## 2020-01-28 ENCOUNTER — Other Ambulatory Visit: Payer: PPO

## 2020-01-28 DIAGNOSIS — G6181 Chronic inflammatory demyelinating polyneuritis: Secondary | ICD-10-CM | POA: Diagnosis not present

## 2020-01-28 DIAGNOSIS — Z515 Encounter for palliative care: Secondary | ICD-10-CM

## 2020-01-30 NOTE — Progress Notes (Signed)
COMMUNITY PALLIATIVE CARE SW NOTE  PATIENT NAME: Adam Cordova DOB: 1956-03-27 MRN: [id number]  PRIMARY CARE PROVIDER: [name], MD  RESPONSIBLE PARTY:  Acct ID - Guarantor Home Phone Work Phone Relationship Acct Type  [id number] CANIO, [id number]  Self P/F     [address] [place], [place], [place] [address]   Due to the COVID-19, this visit was done via telephone from my office and it was initiated and consent by this patient/PCG.   PLAN OF CARE and INTERVENTIONS:             1. GOALS OF CARE/ ADVANCE CARE PLANNING:  Goal of care is to keep patient safely in his home with his wife. Patent is a DNR, form in the home 2. SOCIAL/EMOTIONAL/SPIRITUAL ASSESSMENT/ INTERVENTIONS:  SW completed telephonic visit with patient and his wife. She reported that patient is doing okay. He is taking infusions and is tolerating them well so far. They don't expect to see any results for a few weeks, but is optimistic he will have positive results. PCG report that patient has had several falls in the past few weeks. She feels that his legs seem weaker and wasn't sure if it was as result of the transfusion or disease progress. PCG shared that she is experiencing  Serious health issues and is having to a series of diagnostic test done. While she is going through this testing, patient needs someone to stay with him during the day. SW will e-mail her list of resources to review and contact for assistance. Otherwise his appetite continues to good, but has difficulty feeding himself due to Parkinson's. He continues to require assistance with ADL's. His wife reported that she has found a counselor, but has not seen this person yet. She plans to follow-up. SW provided active listening, supportive presence, reassurance of support, assessed patient's needs and comfort, coping and needs of PCG.  3. PATIENT/CAREGIVER EDUCATION/ COPING:  PCG is experiencing her own health issues while continuing to care for patient.  She has counselor that she will follow-up with for support. She remains open to visits and support by palliative care team. 4. PERSONAL EMERGENCY PLAN:  911 can be activated for emergencies. 5. COMMUNITY RESOURCES COORDINATION/ HEALTH CARE NAVIGATION:  SW reinforced access to palliative care support and visits. 6. FINANCIAL/LEGAL CONCERNS/INTERVENTIONS:  No financial or legal issues presented.     SOCIAL HX:  Social History   Tobacco Use  . Smoking status: Never Smoker  . Smokeless tobacco: Never Used  Substance Use Topics  . Alcohol use: Yes    Comment: 7-10 glasses of wine a week    CODE STATUS: DNR ADVANCED DIRECTIVES: Yes MOST FORM COMPLETE:  No HOSPICE EDUCATION PROVIDED: No  PPS: Patient can ambulate short distances,but utilizes a Amadon to ambulate. He remains a high risk for falls due to complications and progression of his parkinson's disease.   Duration of visit and documentation: 30 minutes      Katheren Puller, LCSW

## 2020-02-04 NOTE — Progress Notes (Signed)
Assessment/Plan:   1.  Parkinsons Disease with PDD  -continue carbidopa/levodopa 25/100 CR, 9 per day (last at 6pm)  -wife going to get some respite care starting Thursday.  [facility].  Discussed LTC.  -discussed PT at home but too much going on there right now with wifes care.    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 Posey Pronto  3.  PDD and depression  -On quetiapine, 25 mg twice per day  -will refer to psychiatry.  Don't want to increase quetiapine d/t potential EDS so would like psychiatry opinion.  -look into [facility]  4.  Dysphagia  - Modified barium swallow was done on October 06, 2019 for cough with questionable dysphagia.  There was mild oral greater than pharyngeal dysphagia.  Patient felt to be a mild aspiration risk.  Home medication with pure recommended along with out patient speech.   5.  B12 deficiency  -On oral supplementation Subjective:   Adam Cordova was seen today in follow up for Parkinsons disease.  My previous records were reviewed prior to todays visit as well as outside records available to me.  Modified barium swallow was done on October 06, 2019 for cough with questionable dysphagia.  There was mild oral greater than pharyngeal dysphagia.  Patient felt to be a mild aspiration risk.  Home medication with pure recommended along with out patient speech.  Pt has had several falls - states no pattern to the falls.  Cannot tell me the nature of falls.  Wife states 2 falls this morning.  Both occurred while getting dressed.   Pt denies lightheadedness, near syncope.  hallucations not bad - better than before seroquel.    Wife did email me a few weeks ago, stating that the patient was more depressed.  Did not want to start an independent antidepressant because of the risk of long QT with quetiapine.  They did not want to increase the quetiapine because he was already so sleepy.  They were going to seek out psychiatry.  Today, they state  that they didn't get to one.  They do need one.  Wife going through a lot.  Has hx of breast CA and may have recurred.  Doing better feeding himself with the right hand.  Current prescribed movement disorder medications:  Carbidopa/levodopa 25/100 CR, up to 9 tablets/day Quetiapine 25 mg twice per day  B12 supplement  PREVIOUS MEDICATIONS:  Sinemet, Mirapex and exelon patch; Provigil; entacapone  ALLERGIES:   Allergies  Allergen Reactions  . Bee Venom Anaphylaxis  . Levaquin [Levofloxacin Hemihydrate] Anaphylaxis and Swelling  . Sertraline Diarrhea and Nausea Only    CURRENT MEDICATIONS:  Outpatient Encounter Medications as of 02/07/2020  Medication Sig  . Carbidopa-Levodopa ER (SINEMET CR) 25-100 MG tablet controlled release TAKE 9 TABLETS PER DAY AS DIRECTED  . Cyanocobalamin (B-12 PO) Take by mouth daily.  Marland Kitchen donepezil (ARICEPT) 10 MG tablet TAKE 1 TABLET BY MOUTH EVERYDAY AT BEDTIME  . Immune Globulin 10% (GAMMAGARD) 10 GM/100ML SOLN   . MYRBETRIQ 25 MG TB24 tablet Take 25 mg by mouth daily.  . naproxen sodium (ALEVE) 220 MG tablet Take 440 mg by mouth 2 (two) times daily as needed (for pain.).  Marland Kitchen QUEtiapine (SEROQUEL) 25 MG tablet TAKE 1 TABLET BY MOUTH TWICE A DAY  . Tamsulosin HCl (FLOMAX) 0.4 MG CAPS Take 0.4 mg by mouth 2 (two) times daily.    No facility-administered encounter medications on file  as of 02/07/2020.    Objective:   PHYSICAL EXAMINATION:    VITALS:   Vitals:   02/07/20 0818  BP: 123/78  Pulse: 76  Resp: 20  SpO2: 96%  Weight: 171 lb (77.6 kg)  Height: 5\' 9"  (1.753 m)    GEN:  The patient appears stated age and is in NAD. HEENT:  Normocephalic, atraumatic.  The mucous membranes are moist. The superficial temporal arteries are without ropiness or tenderness. CV:  RRR Lungs:  CTAB Neck/HEME:  There are no carotid bruits bilaterally.  Neurological examination:  Orientation: The patient is alert and oriented x 2.  He does fall asleep during parts  of exam Cranial nerves: There is good facial symmetry with facial hypomimia. The speech is fluent and clear. Soft palate rises symmetrically and there is no tongue deviation. Hearing is intact to conversational tone. Sensation: Sensation is intact to light touch throughout Motor: Strength is at least antigravity x4.  The R wrist has certainly improved function with motor wrist extension being 4+/5 now.    Movement examination: Tone: There is normal tone in the UE/LE Abnormal movements: none Coordination:  There is mild decremation with RAM's Gait and Station: The patient requires assist out of chair.  Crosses legs when first gets up.  Ambulates fairly well with [place]   I have reviewed and interpreted the following labs independently    Chemistry      Component Value Date/Time   NA 140 12/16/2018 1029   K 4.5 12/16/2018 1029   CL 105 12/16/2018 1029   CO2 28 12/16/2018 1029   BUN 17 12/16/2018 1029   CREATININE 1.06 12/16/2018 1029      Component Value Date/Time   CALCIUM 9.4 12/16/2018 1029   AST 13 12/16/2018 1029   ALT 6 (L) 12/16/2018 1029   BILITOT 0.8 12/16/2018 1029       Lab Results  Component Value Date   WBC 9.6 12/16/2018   HGB 14.8 12/16/2018   HCT 46.5 12/16/2018   MCV 83.6 12/16/2018   PLT 268 12/16/2018    Lab Results  Component Value Date   TSH 0.49 07/24/2018     Total time spent on today's visit was 40 minutes, including both face-to-face time and nonface-to-face time.  Time included that spent on review of records (prior notes available to me/labs/imaging if pertinent), discussing treatment and goals, answering patient's questions and coordinating care.  Cc:  [name], MD

## 2020-02-07 ENCOUNTER — Other Ambulatory Visit: Payer: Self-pay

## 2020-02-07 ENCOUNTER — Encounter: Payer: Self-pay | Admitting: Neurology

## 2020-02-07 ENCOUNTER — Ambulatory Visit: Payer: PPO | Admitting: Neurology

## 2020-02-07 VITALS — BP 123/78 | HR 76 | Resp 20 | Ht 69.0 in | Wt 171.0 lb

## 2020-02-07 DIAGNOSIS — F0280 Dementia in other diseases classified elsewhere without behavioral disturbance: Secondary | ICD-10-CM

## 2020-02-07 DIAGNOSIS — G20 Parkinson's disease: Secondary | ICD-10-CM

## 2020-02-07 DIAGNOSIS — G6181 Chronic inflammatory demyelinating polyneuritis: Secondary | ICD-10-CM

## 2020-02-07 MED ORDER — CARBIDOPA-LEVODOPA ER 25-100 MG PO TBCR: EXTENDED_RELEASE_TABLET | ORAL | 1 refills | Status: DC

## 2020-02-07 MED ORDER — QUETIAPINE FUMARATE 25 MG PO TABS: 25.0000 mg | ORAL_TABLET | Freq: Two times a day (BID) | ORAL | 1 refills | Status: DC

## 2020-02-07 NOTE — Patient Instructions (Addendum)
Resources for Respite Funding [eligibility dementia related dx]   [facility] [place] [address] Phone: [phone] ext. Madelia: Adam Cordova   [facility] [place] [address] Phone: [phone] or [phone] Contact: Adam Cordova Email: [email or web address] Contact: Adam Cordova Email: [email or web address]  We will send a referral to Dr. Clovis Pu (psychiatry) Address: [address], [place], [place] [address] Phone: [phone]

## 2020-02-22 DIAGNOSIS — G6181 Chronic inflammatory demyelinating polyneuritis: Secondary | ICD-10-CM | POA: Diagnosis not present

## 2020-02-23 DIAGNOSIS — G6181 Chronic inflammatory demyelinating polyneuritis: Secondary | ICD-10-CM | POA: Diagnosis not present

## 2020-03-21 DIAGNOSIS — G6181 Chronic inflammatory demyelinating polyneuritis: Secondary | ICD-10-CM | POA: Diagnosis not present

## 2020-03-22 DIAGNOSIS — G6181 Chronic inflammatory demyelinating polyneuritis: Secondary | ICD-10-CM | POA: Diagnosis not present

## 2020-03-30 ENCOUNTER — Other Ambulatory Visit: Payer: Self-pay

## 2020-03-30 MED ORDER — CARBIDOPA-LEVODOPA ER 25-100 MG PO TBCR: EXTENDED_RELEASE_TABLET | ORAL | 1 refills | Status: DC

## 2020-04-11 ENCOUNTER — Other Ambulatory Visit: Payer: PPO

## 2020-04-11 ENCOUNTER — Other Ambulatory Visit: Payer: Self-pay

## 2020-04-11 ENCOUNTER — Other Ambulatory Visit: Payer: PPO | Admitting: *Deleted

## 2020-04-11 VITALS — BP 116/75 | HR 69 | Temp 97.2°F | Resp 18

## 2020-04-11 DIAGNOSIS — Z515 Encounter for palliative care: Secondary | ICD-10-CM

## 2020-04-18 DIAGNOSIS — G6181 Chronic inflammatory demyelinating polyneuritis: Secondary | ICD-10-CM | POA: Diagnosis not present

## 2020-04-18 NOTE — Progress Notes (Signed)
COMMUNITY PALLIATIVE CARE SW NOTE  PATIENT NAME: Adam Cordova DOB: [birth year] MRN: [id number]  PRIMARY CARE PROVIDER: [name], MD  RESPONSIBLE PARTY:  Acct ID - Guarantor Home Phone Work Phone Relationship Acct Type  [id number] JORDANNY, WADDINGTON* [phone]  Self P/F     [address] [place],  [address]     PLAN OF CARE and INTERVENTIONS:             1. GOALS OF CARE/ ADVANCE CARE PLANNING:  Goal is for patient to remain at home. Patient is a DNR.  2. SOCIAL/EMOTIONAL/SPIRITUAL ASSESSMENT/ INTERVENTIONS:  SW and RN-M. Nadara Mustard completed a face-to-face visit with patient at his home where he was present with his wife-Sherry. Patient was sitting on the couch in his living room. SW observed that patient was  in and out of sleep throughout the visit. When he was verbal, his verbalizations were delayed and he had to prompted more than once.  He denied pain. His wife provided a status update on patient. She advised that patient had a fall yesterday in the living room. Patient is having difficulty maintaining his balance. Patient is taking his meds without any problems and his appetite is good. Patient will continue to receive infusions 1x/month every 28 days. Patient is attending [facility] day program 3x's/week from 8:00am-4:30pm. His hallucinations continues. Patient is having more profound tremors in his hands. Patient is dependent for ADL's.  His wife is able to do self-care when patient is at the day program. Patient's wife continues to receive counseling for coping and support.  She is looking for additional sitters for patient at home that can assist him with personal care and housekeeping-SW provided resources for sitters. SW provided assessment of patient needs and comfort and coping of PCG, observation, provided sitter resources, reassurance of support and reinforced support to PCG/patient. SW to provide ongoing assessment of psychosocial needs and provide support.   3. PATIENT/CAREGIVER EDUCATION/ COPING:  Patient seems to be coping well. His wife seems to be coping well, she has good support through her family and friends and she is able to do self-care tasks when patient is at the day program.  4. PERSONAL EMERGENCY PLAN:  911 can be activated for emergencies. 5. COMMUNITY RESOURCES COORDINATION/ HEALTH CARE NAVIGATION:  Patient is attending [facility] for day treatment  Services.  6. FINANCIAL/LEGAL CONCERNS/INTERVENTIONS:  None.     SOCIAL HX:  Social History   Tobacco Use   Smoking status: Never Smoker   Smokeless tobacco: Never Used  Substance Use Topics   Alcohol use: Yes    Comment: 7-10 glasses of wine a week    CODE STATUS: DNR ADVANCED DIRECTIVES:Yes MOST FORM COMPLETE:  No HOSPICE EDUCATION PROVIDED: No  PPS: Patient can ambulate with a [place] short distances.  He remains a high risk for falls due to complications and progression of his parkinson's disease. He is sleeping more overall.   Duration of visit and documentation: 60 minutes       Katheren Puller, LCSW

## 2020-04-19 DIAGNOSIS — G6181 Chronic inflammatory demyelinating polyneuritis: Secondary | ICD-10-CM | POA: Diagnosis not present

## 2020-05-01 NOTE — Progress Notes (Signed)
COMMUNITY PALLIATIVE CARE RN NOTE  PATIENT NAME: Adam Cordova DOB: [birth year] MRN: [id number]  PRIMARY CARE PROVIDER: [name], MD  RESPONSIBLE PARTY:  Acct ID - Guarantor Home Phone Work Phone Relationship Acct Type  [id number] Adam Cordova, KACZMAREK* [phone]  Self P/F     [address], [place], [place] [address]   Covid-19 Pre-screening Negative  PLAN OF CARE and INTERVENTION:  1. ADVANCE CARE PLANNING/GOALS OF CARE: Goal is for patient to remain at home with his wife. He has a DNR. 2. PATIENT/CAREGIVER EDUCATION: Symptom management, safe mobility/transfers, fall prevention 3. DISEASE STATUS: Joint visit made with Palliative care SW, M. Lonon. Met with patient and his wife in their home. Upon arrival, he is sitting on the couch, leaning to the right asleep. He is easily aroused with verbal stimulation. Wife assisted him to a more upright position. He denies pain at this time. Wife reports that patient continues to experience falls. He had a fall yesterday where he just all of a sudden lost his balance. He also had a fall last week, hitting his back on a knob on his dresser. He does have some bruising. His wife states that this morning his hand tremors were debilitating to the point where he couldn't feed himself. Tremors usually mainly occurs more in the evenings, but for some reason today started first thing. She says that he is almost at his max with his Carbidopa/Levodopa. He has an upcoming appointment with his Neurologist soon. He has been receiving IVIG infusions which started in June. His next infusion is next week. He does have some visual hallucinations where he sees someone in the home. This has not been occurring as often. He is intermittently confused at times saying, "This is not our house!" He is able to answer questions, but is often slow to respond. He is sleeping more often on and off throughout the day. He is falling asleep during this visit. He is sleeping well at night.  He goes to [employer] 3x/week on Mondays, Wednesdays, Fridays. Wife feels this is working well. He continues to require assistance with bathing. He ambulates using a [place] with stand-by assistance. His intake is good. She does cut his food up. Denies dysphagia and taking medications whole without difficulties. He does have occasional ankle/feet swelling. None noted today. Will continue to monitor.   HISTORY OF PRESENT ILLNESS: This is a 64 yo male with a diagnosis of Parkinson's disease. Palliative care team asked to follow patient for additional support. Will visit patient monthly and PRN.    CODE STATUS: DNR ADVANCED DIRECTIVES: Y MOST FORM: no PPS: 40%   PHYSICAL EXAM:   VITALS: Today's Vitals   04/11/20 1155  BP: 116/75  Pulse: 69  Resp: 18  Temp: (!) 97.2 F (36.2 C)  TempSrc: Temporal  SpO2: 97%  PainSc: 0-No pain    LUNGS: clear to auscultation  CARDIAC: Cor RRR EXTREMITIES: No edema SKIN: Exposed skin is dry and intact  NEURO: Alert and oriented x 2 (person/place), high fall risk, ambulatory w/Simms, generalized weakness    (Duration of visit and documentation 75 minutes)   Daryl Eastern, RN BSN

## 2020-05-10 DIAGNOSIS — B974 Respiratory syncytial virus as the cause of diseases classified elsewhere: Secondary | ICD-10-CM | POA: Diagnosis not present

## 2020-05-10 DIAGNOSIS — R0981 Nasal congestion: Secondary | ICD-10-CM | POA: Diagnosis not present

## 2020-05-10 DIAGNOSIS — R05 Cough: Secondary | ICD-10-CM | POA: Diagnosis not present

## 2020-05-10 DIAGNOSIS — U071 COVID-19: Secondary | ICD-10-CM | POA: Diagnosis not present

## 2020-05-15 ENCOUNTER — Encounter: Payer: Self-pay | Admitting: Neurology

## 2020-05-15 ENCOUNTER — Telehealth (INDEPENDENT_AMBULATORY_CARE_PROVIDER_SITE_OTHER): Payer: PPO | Admitting: Neurology

## 2020-05-15 ENCOUNTER — Other Ambulatory Visit: Payer: Self-pay

## 2020-05-15 VITALS — Ht 70.0 in | Wt 170.0 lb

## 2020-05-15 DIAGNOSIS — G6181 Chronic inflammatory demyelinating polyneuritis: Secondary | ICD-10-CM | POA: Diagnosis not present

## 2020-05-15 NOTE — Progress Notes (Signed)
   Virtual Visit via Video Note The purpose of this virtual visit is to provide medical care while limiting exposure to the novel coronavirus.    Consent was obtained for video visit:  Yes.   Answered questions that patient had about telehealth interaction:  Yes.   I discussed the limitations, risks, security and privacy concerns of performing an evaluation and management service by telemedicine. I also discussed with the patient that there may be a patient responsible charge related to this service. The patient expressed understanding and agreed to proceed.  Pt location: Home Physician Location: office Name of referring provider:  [name], MD I connected with Shane Crutch at patients initiation/request on 05/15/2020 at  1:30 PM EDT by video enabled telemedicine application and verified that I am speaking with the correct person using two identifiers. Pt MRN:  [id number] Pt DOB:  04-24-1956 Video Participants:  Shane Crutch;  wife   History of Present Illness: This is a 64 y.o. male with Parkinson's disorder returning for follow-up of CIDP manifesting with right hand weakness.  He was started on home IVIG for CIDP in April 2021 and has not appreciated any significant improvement in his hand weakness or paresthesias. He continues to have right wrist drop, which is unchanged.   There has not been any new weakness or numbness/tingling.  He motor difficulty related to Parkinson's disease and continues to fall frequently.  Wife has noticed that his right hand will have violent tremors, which can occur without any specific trigger. This usually occurs at rest.     Observations/Objective:   Vitals:   05/15/20 1306  Weight: 170 lb (77.1 kg)  Height: 5\' 10"  (1.778 m)   Patient is awake, alert, and appears comfortable. Face is symmetric.  Speech is not dysarthric Antigravity in all extremities proximally, right hand with wrist drop (unchanged).  Finger tapping is slowed.. 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  NCS/EMG with absent sensory responses and demyelinating motor responses, although it is surprising that patient does not report abnormal paresthesias. He has been on home infusion with IVIG for the past 6 months and unfortunately, has not responded, therefore, will discontinue IVIG. Patient and wife were informed to let me know if he develops any new weakness.  He also complaints of right hand tremor which is new, I will share this with Dr. Carles Collet.  I will see him back in 6 months.    Follow Up Instructions:   I discussed the assessment and treatment plan with the patient. The patient was provided an opportunity to ask questions and all were answered. The patient agreed with the plan and demonstrated an understanding of the instructions.   The patient was advised to call back or seek an in-person evaluation if the symptoms worsen or if the condition fails to improve as anticipated.  Total time spent:  20 minutes     Alda Berthold, DO

## 2020-05-16 DIAGNOSIS — G6181 Chronic inflammatory demyelinating polyneuritis: Secondary | ICD-10-CM | POA: Diagnosis not present

## 2020-05-31 ENCOUNTER — Telehealth: Payer: Self-pay | Admitting: *Deleted

## 2020-05-31 NOTE — Telephone Encounter (Signed)
Called and left a voicemail to schedule a palliative care home visit. Left contact information for return call.

## 2020-06-01 ENCOUNTER — Other Ambulatory Visit: Payer: PPO | Admitting: *Deleted

## 2020-06-01 ENCOUNTER — Other Ambulatory Visit: Payer: Self-pay

## 2020-06-01 DIAGNOSIS — Z515 Encounter for palliative care: Secondary | ICD-10-CM

## 2020-06-01 NOTE — Progress Notes (Signed)
COMMUNITY PALLIATIVE CARE RN NOTE  PATIENT NAME: Adam Cordova DOB: [birth year] MRN: [id number]  PRIMARY CARE PROVIDER: [name], MD  RESPONSIBLE PARTY: [name] (wife) Acct ID - Guarantor Home Phone Work Phone Relationship Acct Type  [id number] BRIA, PORTALES* [phone]  Self P/F     [address] [place], [place], [place] [address]   Due to the COVID-19 crisis, this virtual check-in visit was done via telephone from my office and it was initiated and consent by this patient and or family.  PLAN OF CARE and INTERVENTION:  1. ADVANCE CARE PLANNING/GOALS OF CARE: Goal is for patient to remain at home with his wife. He has a DNR.  2. PATIENT/CAREGIVER EDUCATION: Symptom management, safe mobility/transfers, fall prevention 3. DISEASE STATUS: Virtual check-in visit completed via telephone. Patient denies pain at this time. Wife reports that patient is on a steady decline. He is experiencing increased falls. During one of his recent falls, he fell against his exercise bike and hit his left buttocks. This area is swollen and bruised. It was painful initially, and she applied ice to help. Now he denies pain, but still has some swelling and bruising. He continues with visual hallucinations and thinking that someone is in the home. Some days confusion is worse than others. He is sleeping more overall. He requires assistance with all ADLs, including feeding at times. He has adaptive utensils he uses during meals, but still has difficulties getting the food onto them. His intake remains normal and she is not noticing in s/s of recent weight loss. She has not noticed any difficulty swallowing and he is taking his medications without difficulty. He was diagnosed with RSV a few weeks ago and was not feeling well, but has improved. He has started going back to [employer] [employer] adult daycare program. He is no longer able to stand without assistance. His gait is more unsteady. His IVIg infusions have  been discontinued as they were not helping, per wife. He is intermittently incontinent of bladder, and wife reports he had 2 occurrences of bowel incontinence recently. She is taking patient to their vacation home for about 10 days on 06/05/20. Will continue to monitor.    HISTORY OF PRESENT ILLNESS: This is a 64 yo male with a diagnosis of Parkinson's disease. He has a h/o dementia, BPH, narcolepsy, gait disturbance and depression. Palliative care team continues to follow patient and visits patient monthly and PRN.    CODE STATUS: DNR  ADVANCED DIRECTIVES: Y MOST FORM: no PPS: 30%   (Duration of visit and documentation 30 minutes)   Daryl Eastern, RN BSN

## 2020-06-02 DIAGNOSIS — S300XXA Contusion of lower back and pelvis, initial encounter: Secondary | ICD-10-CM | POA: Diagnosis not present

## 2020-06-16 DIAGNOSIS — I10 Essential (primary) hypertension: Secondary | ICD-10-CM | POA: Diagnosis not present

## 2020-06-16 DIAGNOSIS — E7800 Pure hypercholesterolemia, unspecified: Secondary | ICD-10-CM | POA: Diagnosis not present

## 2020-06-16 DIAGNOSIS — Z0000 Encounter for general adult medical examination without abnormal findings: Secondary | ICD-10-CM | POA: Diagnosis not present

## 2020-06-16 DIAGNOSIS — Z1389 Encounter for screening for other disorder: Secondary | ICD-10-CM | POA: Diagnosis not present

## 2020-06-16 DIAGNOSIS — G20 Parkinson's disease: Secondary | ICD-10-CM | POA: Diagnosis not present

## 2020-06-16 DIAGNOSIS — N400 Enlarged prostate without lower urinary tract symptoms: Secondary | ICD-10-CM | POA: Diagnosis not present

## 2020-06-16 DIAGNOSIS — Z23 Encounter for immunization: Secondary | ICD-10-CM | POA: Diagnosis not present

## 2020-06-19 DIAGNOSIS — L570 Actinic keratosis: Secondary | ICD-10-CM | POA: Diagnosis not present

## 2020-06-19 DIAGNOSIS — L219 Seborrheic dermatitis, unspecified: Secondary | ICD-10-CM | POA: Diagnosis not present

## 2020-06-19 DIAGNOSIS — D225 Melanocytic nevi of trunk: Secondary | ICD-10-CM | POA: Diagnosis not present

## 2020-06-19 DIAGNOSIS — L814 Other melanin hyperpigmentation: Secondary | ICD-10-CM | POA: Diagnosis not present

## 2020-06-19 DIAGNOSIS — Z85828 Personal history of other malignant neoplasm of skin: Secondary | ICD-10-CM | POA: Diagnosis not present

## 2020-06-19 DIAGNOSIS — L820 Inflamed seborrheic keratosis: Secondary | ICD-10-CM | POA: Diagnosis not present

## 2020-06-19 DIAGNOSIS — L578 Other skin changes due to chronic exposure to nonionizing radiation: Secondary | ICD-10-CM | POA: Diagnosis not present

## 2020-06-19 DIAGNOSIS — Z86008 Personal history of in-situ neoplasm of other site: Secondary | ICD-10-CM | POA: Diagnosis not present

## 2020-06-19 DIAGNOSIS — D485 Neoplasm of uncertain behavior of skin: Secondary | ICD-10-CM | POA: Diagnosis not present

## 2020-06-19 DIAGNOSIS — L821 Other seborrheic keratosis: Secondary | ICD-10-CM | POA: Diagnosis not present

## 2020-06-21 ENCOUNTER — Other Ambulatory Visit: Payer: Self-pay | Admitting: Neurology

## 2020-06-21 NOTE — Progress Notes (Signed)
Assessment/Plan:   1.  Parkinsons Disease with PDD (dx 2010)  -continue carbidopa/levodopa 25/100 CR, 3 in the AM, 2 at 10am, 2 at 2pm, 2 at 6pm.  Told patient's wife that she could try giving patient 1-2 extra tablets of the immediate release (she still had some at home) either crushed or he could chew them or place in ginger ale for as needed use when he was having significant tremor.  It was difficult to tell whether this was true tremor or anxiety, but sounded like it could be tremor.  If that does not help, we can certainly try Inbrija in the future.  -Discussed with the patient's wife that I do not think that he has MSA.  Discussed with her that long standing Parkinson's disease can come with complications such as urinary incontinence, dysphagia and memory change.  If these things occur early on, then certainly it can be MSA, but his disease did not start this way.  Pt unfortunately just has longstanding disease with significant progression  -Would like to see the patient not walk at all and we discussed this today.  However, patient/wife do not think that is realistic.  He is falling multiple times per day.  Given the dementia, however, he may not remember to not walk.  Discussed the merry Venti.  Given a prescription for that.  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  Unfortunately, wrist drop persists  -Under the care of Dr. Posey Pronto  3.  PDD and depression  -On quetiapine, 25 mg twice per day.  Discussed increasing this, but ultimately we decided to hold on that.  Patient is very sleepy, but his wife does not think that is the cause.  States that he has been like that for years.  -[facility]  That has helped the caregiving aspect for his wife, but the patient really does not like it.  -discussed hospice care.  Patient is already under the care of palliative care.  When they are ready for hospice, they can let us know and we can place a formal consult,  or they can just let the palliative care team know.  4.  Dysphagia  - Modified barium swallow was done on October 06, 2019 for cough with questionable dysphagia.  There was mild oral greater than pharyngeal dysphagia.  Patient felt to be a mild aspiration risk.  Home medication with pure recommended along with out patient speech.   5.  B12 deficiency  -On oral supplementation  6.  Urinary incontinence, likely with neurogenic bladder  -I did not receive any notes from urology.  myrbetriq has helped.  If those fail self cath has been recommended, but patient's wife does not feel she would be able to do that.  Patient would not be able to physically do that on his own.  Told him that they really needed to follow back up with urology and ask if indwelling catheter would be appropriate if fails myrbetriq.  This obviously would lend itself to more infection, however, but may relieve some of the caregiving burden from his wife, which is unfortunately growing. Subjective:   NORMON PETTIJOHN was seen today in follow up for Parkinsons disease.  My previous records were reviewed prior to todays visit as well as outside records available to me.  Patient worked in today.  Wife sent email several weeks ago with multiple questions that were most appropriate for appointment.  Patient's wife felt that patient  may not have Parkinson's disease, but rather MSA.  She also had met with the urologist and the urologist recommended self cath.  She states that she is just not able to do this for the patient and wants to know what other options are.  They did start myrbetriq - it has helped.  He was getting up 7-8 times per night and now it is 1-2 times.  Caregiving is becoming more difficult.  Unfortunately, the treatment for his CIDP really did not result in benefit.  [facility], and the patient does not like it, but it does seem to help the caregiving aspect for his wife.  We  referred patient to psychiatry last visit and they didn't hear from the office.  Pt has had many falls - has had multiple falls per day.  More trouble with eating weight.    Having hallucinations.  Sleeping but don't think its from the quetiapine.  Sometimes, his wife will notice flapping tremor that is severe, especially towards the evening time.  Current prescribed movement disorder medications:  Carbidopa/levodopa 25/100 CR, up to 9 tablets/day (3 in the AM, 2 at 10am, 2 at 2pm, 2 at 6pm) Quetiapine 25 mg twice per day  B12 supplement  PREVIOUS MEDICATIONS:  Sinemet, Mirapex and exelon patch; Provigil; entacapone  ALLERGIES:   Allergies  Allergen Reactions  . Bee Venom Anaphylaxis  . Levaquin [Levofloxacin Hemihydrate] Anaphylaxis and Swelling  . Sertraline Diarrhea and Nausea Only    CURRENT MEDICATIONS:  Outpatient Encounter Medications as of 06/22/2020  Medication Sig  . Carbidopa-Levodopa ER (SINEMET CR) 25-100 MG tablet controlled release take 3 tabs at 7AM, 2 at 10AM, 2 at 2 PM and 2 at 6PM  . Cyanocobalamin (B-12 PO) Take by mouth daily.  Marland Kitchen donepezil (ARICEPT) 10 MG tablet TAKE 1 TABLET BY MOUTH EVERYDAY AT BEDTIME  . MYRBETRIQ 25 MG TB24 tablet Take 25 mg by mouth daily.  . naproxen sodium (ALEVE) 220 MG tablet Take 440 mg by mouth 2 (two) times daily as needed (for pain.).  Marland Kitchen QUEtiapine (SEROQUEL) 25 MG tablet Take 1 tablet (25 mg total) by mouth 2 (two) times daily.  . Tamsulosin HCl (FLOMAX) 0.4 MG CAPS Take 0.4 mg by mouth 2 (two) times daily.   . Immune Globulin 10% (GAMMAGARD) 10 GM/100ML SOLN    No facility-administered encounter medications on file as of 06/22/2020.    Objective:   PHYSICAL EXAMINATION:    VITALS:   Vitals:   06/22/20 1251  BP: 124/78  Pulse: 77  SpO2: 98%  Weight: 169 lb (76.7 kg)  Height: '5\' 10"'  (1.778 m)    Wt Readings from Last 3 Encounters:  06/22/20 169 lb (76.7 kg)  05/15/20 170 lb (77.1 kg)  02/07/20 171 lb (77.6 kg)      GEN:  The patient appears stated age and is in NAD. HEENT:  Normocephalic, atraumatic.  The mucous membranes are moist. The superficial temporal arteries are without ropiness or tenderness. CV:  RRR Lungs:  CTAB Neck/HEME:  There are no carotid bruits bilaterally.  Neurological examination:  Orientation: The patient is alert and oriented x 2.  He does fall asleep during parts of exam (same as last visit) Cranial nerves: There is good facial symmetry with facial hypomimia. The speech is fluent and clear. Soft palate rises symmetrically and there is no tongue deviation. Hearing is intact to conversational tone. Sensation: Sensation is intact to light touch throughout Motor: Strength is  at least antigravity x4.  The R wrist has certainly improved function with motor wrist extension being 5-/5 now.    Movement examination: Tone: There is normal tone in the UE/LE Abnormal movements: none Coordination:  There is mild decremation with RAM's Gait and Station: The patient requires assist out of chair.  Requires verbal cues and assistance from examiner (he has a Mangieri but we did not use it).  He shuffles some.  He turns en bloc.  I have reviewed and interpreted the following labs independently    Chemistry      Component Value Date/Time   NA 140 12/16/2018 1029   K 4.5 12/16/2018 1029   CL 105 12/16/2018 1029   CO2 28 12/16/2018 1029   BUN 17 12/16/2018 1029   CREATININE 1.06 12/16/2018 1029      Component Value Date/Time   CALCIUM 9.4 12/16/2018 1029   AST 13 12/16/2018 1029   ALT 6 (L) 12/16/2018 1029   BILITOT 0.8 12/16/2018 1029       Lab Results  Component Value Date   WBC 9.6 12/16/2018   HGB 14.8 12/16/2018   HCT 46.5 12/16/2018   MCV 83.6 12/16/2018   PLT 268 12/16/2018    Lab Results  Component Value Date   TSH 0.49 07/24/2018     Total time spent on today's visit was 45 minutes, including both face-to-face time and nonface-to-face time.  Time included  that spent on review of records (prior notes available to me/labs/imaging if pertinent), discussing treatment and goals, answering patient's questions and coordinating care.  Cc:  [name], MD

## 2020-06-22 ENCOUNTER — Encounter: Payer: Self-pay | Admitting: Neurology

## 2020-06-22 ENCOUNTER — Other Ambulatory Visit: Payer: Self-pay

## 2020-06-22 ENCOUNTER — Ambulatory Visit: Payer: PPO | Admitting: Neurology

## 2020-06-22 VITALS — BP 124/78 | HR 77 | Ht 70.0 in | Wt 169.0 lb

## 2020-06-22 DIAGNOSIS — F0280 Dementia in other diseases classified elsewhere without behavioral disturbance: Secondary | ICD-10-CM | POA: Diagnosis not present

## 2020-06-22 DIAGNOSIS — R441 Visual hallucinations: Secondary | ICD-10-CM | POA: Diagnosis not present

## 2020-06-22 DIAGNOSIS — G20 Parkinson's disease: Secondary | ICD-10-CM

## 2020-06-22 MED ORDER — DONEPEZIL HCL 10 MG PO TABS: 10.0000 mg | ORAL_TABLET | Freq: Every day | ORAL | 3 refills | Status: DC

## 2020-06-22 NOTE — Patient Instructions (Addendum)
RESOURCES: The following are offered as suggested starting places to obtain the educational and caregiving community support services that you may need. This is by no means an exhaustive list, and you are encouraged to seek out further such resources as needed.  [facility] (M[facility])  [phone]  In response to the growing number of families affected by memory loss and dementia, the Memory Counseling Program (MCP) was established in 2011 with the support of the Section on Gerontology and Geriatric Medicine at [facility]. The MCP provides counseling services for individuals diagnosed with mild cognitive impairment (MCI), Alzheimer's disease, or another form of dementia, as well as to their family members. Services include individual, couple, and family counseling, as well as support groups, all of which provide a safe environment to talk about the journey with mild cognitive impairment or dementia, learn as much as possible about the disease, problem solve some of the common challenges encountered with memory and cognitive loss, and strengthen relationships.  The MCP is staffed by licensed practitioners. All services are rendered in a professional manner consistent with the ethical standards of the American Counseling Association and the [employer] of Social Workers.  The MCP is located in the [facility] on the [place]. In general, we see clients every few weeks or monthly, depending on the need. Sessions are typically 45-60 minutes long, scheduled at mutually agreed upon times. Referrals can be made by a health care professional or by self-referral.   It will also be good to talk to your primary care physician in regards to potential contributors of polypharmacy to worsening of your symptoms.   Agency Name: [place]: [phone]  [email or web address].  [facility]:  Provides case management services to community residents seeking placement into either a nursing home or adult care home when they are unable to remain in their current living situations. [phone]    [facility] Adult [employer] - [phone]; After hours: [phone]  Special Assistance In-Home Program:  Provides Medicaid eligible clients with a Special Assistance payment that will supplement their income in order to safely remain in their homes. Must meet eligibility requirements. [phone]  In-Home Aide Services:  The program contracts with community home care agencies to provide in home services, when there is a need, such as light housekeeping and personal care services (bathing, hair washing, toileting) that assist clients/customers with safely remaining in their homes. [phone]   Alzheimer's Association 1. The Alzheimer's Association is the leading voluntary health organization in Alzheimer's care, support and research.  Website: CapitalMile.co.nz   2. Research officer, political party: Get easy access to a comprehensive listing of Alzheimer's and dementia resources, community programs, and services. Website: https://www.arnold.com/.org/  3. ALZConnected is a free Nature conservation officer (message board) for everyone affected by Alzheimer's or another dementia, including: . People with the disease. . Caregivers. . Family members. . Friends. . Individuals who have lost someone to Alzheimer's. Website: alzconnected.org  Agency Name: Water quality scientist: www.eldercare.gov  Phone: [phone] When you contact the [facility] Locator you will be connected with local aging resources, such as the [place] on Aging (AAA), the Aging and [facility]), and others. These agencies are familiar with programs and services for older adults and caregivers. The following are some examples of services and supports  commonly available to you through these resources:  >Adult Day Care - A protective setting for older  adults in need of assistance during the day  >Caregiver Support - Programs to support those taking care of older adults  >Respite Care - Opportunities to relieve caregivers of daily duties. >Case Management - Help identifying needs and coordinating services

## 2020-06-29 ENCOUNTER — Other Ambulatory Visit: Payer: PPO

## 2020-06-29 ENCOUNTER — Other Ambulatory Visit: Payer: Self-pay

## 2020-06-29 DIAGNOSIS — Z515 Encounter for palliative care: Secondary | ICD-10-CM

## 2020-06-30 NOTE — Progress Notes (Signed)
COMMUNITY PALLIATIVE CARE SW NOTE  PATIENT NAME: Adam Cordova DOB: 08/22/1956 MRN: [id number]  PRIMARY CARE PROVIDER: [name], MD  RESPONSIBLE PARTY:  Acct ID - Guarantor Home Phone Work Phone Relationship Acct Type  [id number] Adam [id number]  Self P/F     [address] [place], [place], [place] [address]   Due to the COVID-19, this visit was done via telephone from my office and it was initiated and consent by this patient and/or family.    PLAN OF CARE and INTERVENTIONS:             1. GOALS OF CARE/ ADVANCE CARE PLANNING:  Goal is for patient to remain at home. Patient is a DNR, form in the home. 2. SOCIAL/EMOTIONAL/SPIRITUAL ASSESSMENT/ INTERVENTIONS:  SW completed telephonic visit with patient's wife-Adam Cordova. She declined a visit at home as they were currently at their beach house. Adam Cordova provided a status update on patient. She advised that patient is having 3 to 5 falls a day with him having a fall as recent as yesterday. His PCP recommended that patient utilize a wheelchair and he refuses. He is using a Agostini. He is sleeping more throughout the day. His appetite remains fair and he is not having any swallowing issues. He does have coughing at night. Patient is having ongoing hallucinations. His wife described that when he has hallucinations at night it carries over into the day. Patient's weight is currently 169.4lbs. Adam Cordova is interested in RN's evaluation and assessment of patient status during next home visit. Adam Cordova advised SW to call next month to schedule a visit. SW provided assessment of patient needs and comfort and coping of PCG, observation, provided sitter resources, reassurance of support and reinforced support to PCG/patient. SW to provide ongoing assessment of psychosocial needs and provide support.  3. PATIENT/CAREGIVER EDUCATION/ COPING:  PCG and patient are coping well and adjusting to changes in his condition.  4. PERSONAL EMERGENCY PLAN:  911 can be  activated for support.  5. COMMUNITY RESOURCES COORDINATION/ HEALTH CARE NAVIGATION: Patient is attending [facility] for day treatment  Services.  6. FINANCIAL/LEGAL CONCERNS/INTERVENTIONS:  None.     SOCIAL HX:  Social History   Tobacco Use  . Smoking status: Never Smoker  . Smokeless tobacco: Never Used  Substance Use Topics  . Alcohol use: Yes    Comment: 7-10 glasses of wine a week    CODE STATUS: DNR ADVANCED DIRECTIVES: Yes MOST FORM COMPLETE: No HOSPICE EDUCATION PROVIDED: No  PPS: Patient can ambulate with a Vandezande short distances.  Heremainsa high risk for fallsdue to complications and progression of his parkinson's disease.He is sleeping more overall.   Duration of telephonic visit and documentation: 30 minutes       Katheren Puller, LCSW

## 2020-07-18 ENCOUNTER — Ambulatory Visit: Payer: PPO | Admitting: Neurology

## 2020-07-29 DIAGNOSIS — W19XXXA Unspecified fall, initial encounter: Secondary | ICD-10-CM | POA: Diagnosis not present

## 2020-07-29 DIAGNOSIS — S2232XA Fracture of one rib, left side, initial encounter for closed fracture: Secondary | ICD-10-CM | POA: Diagnosis not present

## 2020-08-08 ENCOUNTER — Ambulatory Visit: Payer: PPO | Admitting: Neurology

## 2020-08-21 ENCOUNTER — Other Ambulatory Visit: Payer: Self-pay | Admitting: Neurology

## 2020-08-23 ENCOUNTER — Telehealth: Payer: Self-pay

## 2020-08-23 NOTE — Telephone Encounter (Signed)
(  3:38p) Telephone call to patient to schedule palliative care visit with patient. SW left a message for his wife extending support and requested a call back.

## 2020-09-01 ENCOUNTER — Other Ambulatory Visit: Payer: Self-pay

## 2020-09-01 ENCOUNTER — Telehealth: Payer: Self-pay

## 2020-09-01 ENCOUNTER — Other Ambulatory Visit: Payer: PPO

## 2020-09-01 DIAGNOSIS — Z515 Encounter for palliative care: Secondary | ICD-10-CM

## 2020-09-01 NOTE — Progress Notes (Signed)
COMMUNITY PALLIATIVE CARE SW NOTE  PATIENT NAME: Adam Cordova DOB: [birth year] MRN: [id number]  PRIMARY CARE PROVIDER: Blair Heys, MD  RESPONSIBLE PARTY:  Acct ID - Guarantor Home Phone Work Phone Relationship Acct Type  [id number] Adam Cordova* [phone]  Self P/F     [address] [place], [place], [place] [address]   Due to the COVID-19, this visit was done via telephone from my office and it was initiated and consent by this patient and/or family.   PLAN OF CARE and INTERVENTIONS:             1. GOALS OF CARE/ ADVANCE CARE PLANNING: Goal is for patient to remain at home. Patient is a DNR, form in the home. 1.   2. SOCIAL/EMOTIONAL/SPIRITUAL ASSESSMENT/ INTERVENTIONS:    SW completed telephonic visit with patient's wife-Adam Cordova. She declined a homevisit as they were currently at their beach house. Adam Cordova provided a status update on patient. She advised that patient had a fall in the shower last nights. She had friends present that assisted her with getting him up. She reported that he already had 2 falls this day so far. She feels that patient is forgetting to walk.  He continues to use his Schlink. He is sleeping more throughout the day. His appetite remains fair. He will say he is not hungry, but then will eat if he is given food.  He continues to cough at night. Patient is having ongoing hallucinations. His wife described that when he has hallucinations at night it carries over into the day. She reports that she believes patient has lost weight as he looks thinner. He is no longer participating in [employer]. Instead she is looking for a full time sitter. SW provided active listening, assessed needs and coping, reassurance of support and resources. Patient wife is open to ongoing support by SW and palliative care team.  2.  3. PATIENT/CAREGIVER EDUCATION/ COPING:  PCG and patient are coping well and adjusting to changes in his condition. 3.  4. PERSONAL EMERGENCY PLAN:  911 can  be activated for emergencies. 5. COMMUNITY RESOURCES COORDINATION/ HEALTH CARE NAVIGATION:  Wife will be seeking to hire private sitters.  6. FINANCIAL/LEGAL CONCERNS/INTERVENTIONS:  None.     SOCIAL HX:  Social History   Tobacco Use  . Smoking status: Never Smoker  . Smokeless tobacco: Never Used  Substance Use Topics  . Alcohol use: Yes    Comment: 7-10 glasses of wine a week    CODE STATUS: DNR ADVANCED DIRECTIVES: Yes MOST FORM COMPLETE:  No HOSPICE EDUCATION PROVIDED: No  PPS: Patient can ambulatewith a walkershort distances. He has multiple falls and he remainsat high risk for fallsdue to complications and progression of his parkinson's disease.He is sleeping more overall.  Duration of telephonic visit and documentation:30 minutes      Adam Llano, LCSW

## 2020-09-08 DIAGNOSIS — Z20822 Contact with and (suspected) exposure to covid-19: Secondary | ICD-10-CM | POA: Diagnosis not present

## 2020-09-22 ENCOUNTER — Telehealth: Payer: Self-pay

## 2020-09-22 NOTE — Telephone Encounter (Signed)
Patient's wife [name] returned telephone call to SW to schedule palliative care visit with patient. Patient/family in agreement with home visit on 09/29/20@12 :30pm.

## 2020-09-22 NOTE — Telephone Encounter (Signed)
(  11:58am) SW left a message for patient's wife-Adam Cordova extending support to her and requesting a call back.

## 2020-09-29 ENCOUNTER — Other Ambulatory Visit: Payer: PPO | Admitting: *Deleted

## 2020-09-29 ENCOUNTER — Other Ambulatory Visit: Payer: Self-pay

## 2020-09-29 ENCOUNTER — Other Ambulatory Visit: Payer: PPO

## 2020-09-29 VITALS — BP 113/78 | HR 71 | Temp 97.6°F | Resp 16

## 2020-09-29 DIAGNOSIS — Z515 Encounter for palliative care: Secondary | ICD-10-CM

## 2020-10-02 NOTE — Progress Notes (Signed)
COMMUNITY PALLIATIVE CARE SW NOTE  PATIENT NAME: Adam Cordova DOB: 12-13-1955 MRN: [id number]  PRIMARY CARE PROVIDER: [name], MD  RESPONSIBLE PARTY:  Acct ID - Guarantor Home Phone Work Phone Relationship Acct Type  [id number] RUEBEN, KASSIM* [phone]  Self P/F     [address], [place], [place] [address]     PLAN OF CARE and INTERVENTIONS:             1. GOALS OF CARE/ ADVANCE CARE PLANNING:  Goal is for patient to remain at home. Patient is a DNR, form in the home 1.  2. SOCIAL/EMOTIONAL/SPIRITUAL ASSESSMENT/ INTERVENTIONS:  SW and RN-completed a face-to-face visit with patient at his home. His wife [name] was present with him. Patient was initially awake and alert, but then fell asleep. Patient slept throughout the visit unless verbally prompted. His wife advised that they have been spending time between their home and beach home. She noted how difficult the transition is for patient. He had several falls at the beach home, just as he does when he is at home. Patient most recent fall was this morning in the kitchen. He also fell in the shower last week. He has several bruises and scratches from the falls. Patient requires stand-up assistance and uses his Vent while in the house. He uses his wheelchair when he goes out for appointments. He continues to need assistance with showers. He feeds himself most days, but sometimes needs help with feeding. His wife describes periods where patient has low energy, become disoriented and pass out without warning. These episodes have happened at least once a month. Patient has coughing with congestion that is worse at night. He has a swallow study done in November and it showed the beginning signs of an issue. Patient's tremors are more pronounced in his right hand. He has had weight loss. His appetite has decreased and his intake has declined where he is still eating three meals, but smaller portions. He enjoys his sweets still. Patient  continues to have auditory hallucinations around his brother. Overall he is more withdrawn and less engaged with his wife and children and is sleeping more during the day. Patient is no longer attending [facility]. Instead he has in-home personal care assistance through [facility], M-Thurs., 10:30am-4:30pm. SW provided active listening, assessed needs and coping, reassurance of support and provided respite resources.  Patient wife is open to ongoing support by SW and palliative care team.  2.  3. PATIENT/CAREGIVER EDUCATION/ COPING:  PCG and patient are coping well and adjusting to changes in his condition. 3.  4. PERSONAL EMERGENCY PLAN: 911 is available for emergencies. 5. COMMUNITY RESOURCES COORDINATION/ HEALTH CARE NAVIGATION:  Patient receiving personal care support through [facility].  6. FINANCIAL/LEGAL CONCERNS/INTERVENTIONS:  None.      SOCIAL HX:  Social History   Tobacco Use  . Smoking status: Never Smoker  . Smokeless tobacco: Never Used  Substance Use Topics  . Alcohol use: Yes    Comment: 7-10 glasses of wine a week    CODE STATUS: DNR ADVANCED DIRECTIVES:Yes MOST FORM COMPLETE:  No HOSPICE EDUCATION PROVIDED: No  PPS: Patient can ambulatewith a walkershort distances. He continues to have multiple falls and he remainsat high risk for fallsdue to complications and progression of his parkinson's disease.He is sleeping more overall.  Duration ofvisit and documentation:60 minutes      Katheren Puller, LCSW

## 2020-10-10 NOTE — Progress Notes (Signed)
COMMUNITY PALLIATIVE CARE RN NOTE  PATIENT NAME: Adam Cordova DOB: 1956/02/24 MRN: [id number]  PRIMARY CARE PROVIDER: [name], MD  RESPONSIBLE PARTY: [name] (wife) Acct ID - Guarantor Home Phone Work Phone Relationship Acct Type  [id number] Idelle [phone]  Self P/F     [address] [place], [place] [address]   Covid-19 Pre-screening Negative  PLAN OF CARE and INTERVENTION:  1. ADVANCE CARE PLANNING/GOALS OF CARE: Goal is for patient to remain at home with his wife. He has a DNR. 2. PATIENT/CAREGIVER EDUCATION: Symptom management, safe mobility/transfers, fall prevention, hospice education 3. DISEASE STATUS: Joint visit made with Palliative care MSW, [facility]. Met with patient and his wife in their home. Upon arrival, patient is sitting up on the couch asleep. He is easily aroused with verbal stimulation. He is able to answer simple questions, but is delayed in his responses. He denies pain at this time. No issues with shortness of breath. They recently went to the beach for a few weeks. Sometimes it is difficult cognitively making the transition to a different location. He does continue to have some visual and auditory hallucinations but they are no longer frightening. Recently he thought his brother was in the home. Seroquel is effective. Wife says that his tremors have worsened. She has noticed that he has been having episodes where he will repeatedly hit his leg with his left hand at a very rapid rate. Sometimes his tremors wake him up during the night. She says she does have orders where she can give him an additional 1-2 tablets of his Carbidopa/Levodopa and it does help. He requires assistance with standing. He ambulates using a [place]. He has frequent falls, but has slowed some in the past few weeks. He had a fall in the shower this week and last week fell in the kitchen. He did sustain some bruising on his L hand/wrist which are still present. His intake  is variable. He is mainly eating 3 meals/day, but of smaller portion sizes. He requires feeding at times. He does have some difficulty swallowing at times, on liquids more than food. Wife says that he has lost some weight as evidence by his clothes fitting looser. Legs are smaller in appearance as he is losing muscle tone. He is sleeping most of the day and slept throughout most of visit. He does not seem interested in any activities and doesn't talk much. She has a private caregiver Monday through Thursday from 2:30p - 4:30p. Wife had questions regarding hospice services. Explained that in order to qualify for hospice services that his doctor would have to believe that if his disease follows its natural progression that the prognosis is thought to be 6 months or less. Wife says she was not aware that hospice still followed these guidelines and does not feel that patient is at this point as of yet. Will continue to monitor.  HISTORY OF PRESENT ILLNESS: This is a 65 yo male with a diagnosis of Parkinson's disease. He has a h/o dementia, BPH, narcolepsy, gait disturbance and depression. Palliative care team continues to follow patient and visits patient monthly and PRN.    CODE STATUS: DNR  ADVANCED DIRECTIVES: Y MOST FORM: no PPS: 30%   PHYSICAL EXAM:   VITALS: Today's Vitals   09/29/20 1240  BP: 113/78  Pulse: 71  Resp: 16  Temp: 97.6 F (36.4 C)  TempSrc: Temporal  SpO2: 95%  PainSc: 0-No pain    LUNGS: clear to auscultation  CARDIAC:  Cor RRR EXTREMITIES: No edema noted SKIN: Bruising noted to left wrist and forearm  NEURO: alert and oriented to person/place, intermittent confusion, visual/auditory hallucinations, increased generalized weakness, fall risk, ambulatory w/Bernabe    (Duration of visit and documentation 75 minutes)   Daryl Eastern, RN BSN

## 2020-10-17 NOTE — Telephone Encounter (Signed)
NA

## 2020-10-30 ENCOUNTER — Other Ambulatory Visit: Payer: PPO

## 2020-10-30 ENCOUNTER — Other Ambulatory Visit: Payer: Self-pay

## 2020-10-30 DIAGNOSIS — Z515 Encounter for palliative care: Secondary | ICD-10-CM

## 2020-10-30 NOTE — Progress Notes (Signed)
COMMUNITY PALLIATIVE CARE SW NOTE  PATIENT NAME: Adam Cordova DOB: Aug 15, 1956 MRN: [id number]  PRIMARY CARE PROVIDER: [name], MD  RESPONSIBLE PARTY:  Acct ID - Guarantor Home Phone Work Phone Relationship Acct Type  [id number] Idelle [phone]  Self P/F     [address] [place], [place], [place] [address]   Due to the COVID-19 crisis, this virtual check-in visit was done via telephone from my office and it was initiated and consent by this patient and or family.  PLAN OF CARE and INTERVENTIONS:             1. GOALS OF CARE/ ADVANCE CARE PLANNING:  Goal is for patient to remain at home. Patient is a DNR, form in the home. 1.  2. SOCIAL/EMOTIONAL/SPIRITUAL ASSESSMENT/ INTERVENTIONS:  SW completed a telephonic visit with patient's wife/PCG-[name]. She advised that patient was is having difficulty ambulating, often slumping over. He has had multiple falls in the showers. He is not sleeping at night due to having increased hallucinations. Patient is still napping during the day also. His appetite is fair, but his wife observed that patient appears to have lost weight. She was not sure what is current weight is, but notice that his clothes are looser. SW inquired if PCG considered hiring sitters at night. She stated that she is trying to do as much as she can on her own to conserve money that she may need to place patient. She continues to have sitters M-Thurs., 10:30am-4:30pm. SW provided active listening, assessed needs and coping, reassurance of support and provided respite resources.  Patient wife is open to ongoing support by SW and palliative care team 3. PATIENT/CAREGIVER EDUCATION/ COPING:  PCG and patient are coping well and adjusting to changes in his condition. 2.  3. PERSONAL EMERGENCY PLAN:  911 can be accessed for emergencies. 3. COMMUNITY RESOURCES COORDINATION/ HEALTH CARE NAVIGATION:  Patient receiving personal care support through [facility]. 4. FINANCIAL/LEGAL  CONCERNS/INTERVENTIONS:  None.    SOCIAL HX:  Social History   Tobacco Use  . Smoking status: Never Smoker  . Smokeless tobacco: Never Used  Substance Use Topics  . Alcohol use: Yes    Comment: 7-10 glasses of wine a week    CODE STATUS: DNR ADVANCED DIRECTIVES: Yes MOST FORM COMPLETE:  No HOSPICE EDUCATION PROVIDED: No  PPS: Patient having difficulty ambulating. He continues to have multiple falls and heremainsathigh risk for fallsdue to complications and progression of his parkinson's disease.He is sleeping more overall and having increased hallucinations.  Duration oftelephonic visit and documentation:45 minutes      Katheren Puller, LCSW

## 2020-11-20 DIAGNOSIS — N281 Cyst of kidney, acquired: Secondary | ICD-10-CM | POA: Diagnosis not present

## 2020-11-20 DIAGNOSIS — N401 Enlarged prostate with lower urinary tract symptoms: Secondary | ICD-10-CM | POA: Diagnosis not present

## 2020-11-20 DIAGNOSIS — G20 Parkinson's disease: Secondary | ICD-10-CM | POA: Diagnosis not present

## 2020-11-20 DIAGNOSIS — R3914 Feeling of incomplete bladder emptying: Secondary | ICD-10-CM | POA: Diagnosis not present

## 2020-11-20 NOTE — Progress Notes (Signed)
Assessment/Plan:   1.  Parkinsons Disease with PDD (dx 2010)  -we talked about changing to IR from the CR.  The benefit would be better motor control.  The risk would be worsening cognition.  They would like to try.  Take carbidopa/levodopa 25/100 IR (instead of the CR), 3 in the AM, 2 at 10am, 2 at 2pm, 2 at 6pm.  Told patient's wife that she could still give him an extra if needed throughout the day.  -I do not want the patient walking any longer.  Talked to him/wife about this last several visits.  Discussed risk of hip fx/brain bleeds  -Long discussion with patient and wife regarding subacute nursing facilities.  They really have not talked openly about this and I told them that it is time that this happens.  Wife does not think that she can caregiver in the home much longer.  She has some help in the home.  He is no longer attending [facility].  We discussed [facility] as well and they were given a brochure for this.  Once I have a Education officer, museum in the office, I am happy to have him meet with her as well.  -if above doesn't help, will try inbjria.  Think that this may be difficult for patient to use logistically.   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  Unfortunately, wrist drop persists  -Under the care of Dr. Posey Pronto  3.  PDD and depression  -On quetiapine, 25 mg twice per day.  Discussed increasing this, but ultimately we decided to hold on that.  Patient is very sleepy, but his wife does not think that is the cause.  States that he has been like that for years.  -Patient under palliative care  4.  Dysphagia  - Modified barium swallow was done on October 06, 2019 for cough with questionable dysphagia.  There was mild oral greater than pharyngeal dysphagia.  Patient felt to be a mild aspiration risk.  Home medication with pure recommended along with out patient speech.   5.  B12 deficiency  -On oral supplementation  6.  Urinary incontinence, likely with neurogenic bladder  -Under the  care of urology.  Patient using Myrbetriq.  7.  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  Will monitor.  8.  Left foot dystonia  -Refer to Dr. Letta Pate for possible Botox.  Subjective:   Adam Cordova was seen today in follow up for Parkinsons disease.  My previous records were reviewed prior to todays visit as well as outside records available to me.  Patient has advanced Parkinson's disease with PDD.  Given multiple falls, I asked them to have him not walk any longer.  They did not think that was necessarily realistic.  He was given a prescription for a merry [place]rio as they did not think he would be able to remember not to walk.  Patient still with multiple falls, especially in the shower.  Had one recently onto a glass table.  Patient with hallucinations.   Caregiver burden continues to increase.  Patient no longer attending [facility].  Do have some caregiving in the home, but wife does not think that she is going to be able to do this much longer.  Palliative care notes are reviewed.  Pt states that having lots of "episodes where I vibrate" (describing tremor).  Wife using extra IR 1.5 tablets 2 times per day in addition to the CR.  Wife complains about patient's  left foot turning in.  They got new shoes and he is "rolling the ankle" with them.  Current prescribed movement disorder medications:  Carbidopa/levodopa 25/100 CR, up to 9 tablets/day (3 in the AM, 2 at 10am, 2 at 2pm, 2 at 6pm, extra prn) Quetiapine 25 mg twice per day  B12 supplement  PREVIOUS MEDICATIONS:  Sinemet, Mirapex and exelon patch; Provigil; entacapone  ALLERGIES:   Allergies  Allergen Reactions  . Bee Venom Anaphylaxis  . Levaquin [Levofloxacin Hemihydrate] Anaphylaxis and Swelling  . Sertraline Diarrhea and Nausea Only    CURRENT MEDICATIONS:  Outpatient Encounter Medications as of 11/21/2020  Medication Sig  . Carbidopa-Levodopa ER (SINEMET CR) 25-100 MG tablet  controlled release TAKE 3 TABS AT 7AM, 2 AT 10AM, 2 AT 2 PM AND 2 AT 6PM  . Cyanocobalamin (B-12 PO) Take by mouth daily.  Marland Kitchen donepezil (ARICEPT) 10 MG tablet Take 1 tablet (10 mg total) by mouth at bedtime.  Marland Kitchen MYRBETRIQ 25 MG TB24 tablet Take 25 mg by mouth daily.  . naproxen sodium (ALEVE) 220 MG tablet Take 440 mg by mouth 2 (two) times daily as needed (for pain.).  Marland Kitchen QUEtiapine (SEROQUEL) 25 MG tablet TAKE 1 TABLET BY MOUTH TWICE A DAY  . Tamsulosin HCl (FLOMAX) 0.4 MG CAPS Take 0.4 mg by mouth 2 (two) times daily.    No facility-administered encounter medications on file as of 11/21/2020.    Objective:   PHYSICAL EXAMINATION:    VITALS:   Vitals:   11/21/20 0937  BP: 94/64  Pulse: 85  SpO2: 98%  Weight: 171 lb (77.6 kg)  Height: 5\' 10"  (1.778 m)    Wt Readings from Last 3 Encounters:  11/21/20 171 lb (77.6 kg)  06/22/20 169 lb (76.7 kg)  05/15/20 170 lb (77.1 kg)     GEN:  The patient appears stated age and is in NAD. HEENT:  Normocephalic, atraumatic.  The mucous membranes are moist. The superficial temporal arteries are without ropiness or tenderness. CV:  RRR Lungs:  CTAB Neck/HEME:  There are no carotid bruits bilaterally.  Neurological examination:  Orientation: The patient is alert and oriented to person and place.  Has trouble staying focused on conversation topic, and wants to discuss tremor alone (we are trying to discuss alternative living situations). Cranial nerves: There is good facial symmetry with facial hypomimia. The speech is fluent and clear. Soft palate rises symmetrically and there is no tongue deviation. Hearing is intact to conversational tone. Sensation: Sensation is intact to light touch throughout   Movement examination: Tone: There is normal tone in the UE/LE Abnormal movements: there is RUE tremor that is at least mod amplitude Coordination:  There is mild decremation with RAM's Gait and Station: The patient requires assist out of chair.   Requires verbal cues and assistance from examiner.  He is given a [place].  He has dystonia of the left foot as he ambulates, but walks fairly well with a [place].  He does turn en bloc and freezes some in the turn.  I have reviewed and interpreted the following labs independently    Chemistry      Component Value Date/Time   NA 140 12/16/2018 1029   K 4.5 12/16/2018 1029   CL 105 12/16/2018 1029   CO2 28 12/16/2018 1029   BUN 17 12/16/2018 1029   CREATININE 1.06 12/16/2018 1029      Component Value Date/Time   CALCIUM 9.4 12/16/2018 1029   AST 13 12/16/2018 1029  ALT 6 (L) 12/16/2018 1029   BILITOT 0.8 12/16/2018 1029       Lab Results  Component Value Date   WBC 9.6 12/16/2018   HGB 14.8 12/16/2018   HCT 46.5 12/16/2018   MCV 83.6 12/16/2018   PLT 268 12/16/2018    Lab Results  Component Value Date   TSH 0.49 07/24/2018     Total time spent on today's visit was 42 minutes, including both face-to-face time and nonface-to-face time.  Time included that spent on review of records (prior notes available to me/labs/imaging if pertinent), discussing treatment and goals, answering patient's questions and coordinating care.  Cc:  [name], MD

## 2020-11-21 ENCOUNTER — Ambulatory Visit: Payer: PPO | Admitting: Neurology

## 2020-11-21 ENCOUNTER — Other Ambulatory Visit: Payer: Self-pay

## 2020-11-21 ENCOUNTER — Encounter: Payer: Self-pay | Admitting: Neurology

## 2020-11-21 VITALS — BP 94/64 | HR 85 | Ht 70.0 in | Wt 171.0 lb

## 2020-11-21 DIAGNOSIS — G20 Parkinson's disease: Secondary | ICD-10-CM | POA: Diagnosis not present

## 2020-11-21 DIAGNOSIS — F0280 Dementia in other diseases classified elsewhere without behavioral disturbance: Secondary | ICD-10-CM

## 2020-11-21 DIAGNOSIS — G249 Dystonia, unspecified: Secondary | ICD-10-CM

## 2020-11-21 MED ORDER — CARBIDOPA-LEVODOPA 25-100 MG PO TABS: ORAL_TABLET | ORAL | 1 refills | Status: DC

## 2020-11-21 NOTE — Patient Instructions (Addendum)
STOP carbidopa/levodopa 25/100 CR for now START carbidopa/levodopa 25/100 IR, 3 in the AM, 2 at 10am, 2 at 2pm, 2 at 6pm.  Still can take an extra in day if needed. I will have my social worker contact you but want you to discuss alternate living situations.

## 2020-11-30 ENCOUNTER — Other Ambulatory Visit: Payer: PPO | Admitting: *Deleted

## 2020-11-30 ENCOUNTER — Other Ambulatory Visit: Payer: Self-pay

## 2020-11-30 DIAGNOSIS — Z515 Encounter for palliative care: Secondary | ICD-10-CM

## 2020-12-01 ENCOUNTER — Encounter: Payer: Self-pay | Admitting: Physical Medicine & Rehabilitation

## 2020-12-08 ENCOUNTER — Telehealth: Payer: Self-pay | Admitting: Licensed Clinical Social Worker

## 2020-12-14 NOTE — Telephone Encounter (Signed)
T/C to Pt to provide community resources and educational information

## 2020-12-14 NOTE — Progress Notes (Signed)
COMMUNITY PALLIATIVE CARE RN NOTE  PATIENT NAME: Adam Cordova DOB: 08-01-1956 MRN: [id number]  PRIMARY CARE PROVIDER: [name], MD  RESPONSIBLE PARTY: [name] (wife) Acct ID - Guarantor Home Phone Work Phone Relationship Acct Type  [id number] GRANITE, GODMAN* [phone]  Self P/F     [address] [place], [place], [place] [address]   Due to the COVID-19 crisis, this virtual check-in visit was done via telephone from my office and it was initiated and consent by this patient and or family.  PLAN OF CARE and INTERVENTION:  1. ADVANCE CARE PLANNING/GOALS OF CARE: Goal is for patient to remain in his home for as long as possible. He has a DNR. 2. PATIENT/CAREGIVER EDUCATION: Symptom management, safe mobility/transfers, fall prevention 3. DISEASE STATUS: Virtual check-in visit completed via telephone. Patient denies pain at this time. He continues to have issues with his balance and progressive overall weakness. He requires assistance with standing. He is able to ambulate using a [place], but has frequent falls. Wife states that recently they traveled to the beach, and about a week in a half ago while there, he fell onto a glass table. He sustained a cut on his bottom, hand, arm and his finger. Wife says the Neurologist would prefer that patient use the wheelchair, however patient will not comply with this. He continues with some visual and auditory hallucinations. He also continues with worsening tremors. He does sleep often throughout much of the day. He does sleep well during the night most of the time, but [name] says when there is a full moon he will wake up about 10x/night. He requires assistance with bathing, dressing and standing. His appetite is fair. He continues to eat 3 meals/day of smaller portion sizes. He continues with a private caregiver Monday through Thursday from 2:30 to 4:30p. Will continue to monitor.   HISTORY OF PRESENT ILLNESS:  This is a 65 yo male with a diagnosis of  Parkinson's disease.He has a h/o dementia, BPH, narcolepsy, gait disturbance and depression.Palliative care team continues to follow patient andvisitspatient monthly and PRN.   CODE STATUS: DNR ADVANCED DIRECTIVES: Y MOST FORM: no PPS: 30%   (Duration of visit and documentation 20 minutes)   Daryl Eastern, RN BSN

## 2020-12-15 ENCOUNTER — Other Ambulatory Visit: Payer: Self-pay

## 2020-12-15 ENCOUNTER — Other Ambulatory Visit: Payer: PPO | Admitting: *Deleted

## 2020-12-15 VITALS — BP 132/89 | HR 67 | Temp 97.6°F | Resp 16 | Ht 70.0 in | Wt 169.2 lb

## 2020-12-15 DIAGNOSIS — Z515 Encounter for palliative care: Secondary | ICD-10-CM

## 2020-12-20 DIAGNOSIS — B353 Tinea pedis: Secondary | ICD-10-CM | POA: Diagnosis not present

## 2020-12-20 DIAGNOSIS — Z85828 Personal history of other malignant neoplasm of skin: Secondary | ICD-10-CM | POA: Diagnosis not present

## 2020-12-20 DIAGNOSIS — D044 Carcinoma in situ of skin of scalp and neck: Secondary | ICD-10-CM | POA: Diagnosis not present

## 2020-12-20 DIAGNOSIS — D485 Neoplasm of uncertain behavior of skin: Secondary | ICD-10-CM | POA: Diagnosis not present

## 2020-12-20 DIAGNOSIS — L814 Other melanin hyperpigmentation: Secondary | ICD-10-CM | POA: Diagnosis not present

## 2020-12-20 DIAGNOSIS — L578 Other skin changes due to chronic exposure to nonionizing radiation: Secondary | ICD-10-CM | POA: Diagnosis not present

## 2020-12-20 DIAGNOSIS — L821 Other seborrheic keratosis: Secondary | ICD-10-CM | POA: Diagnosis not present

## 2020-12-20 DIAGNOSIS — D225 Melanocytic nevi of trunk: Secondary | ICD-10-CM | POA: Diagnosis not present

## 2020-12-20 DIAGNOSIS — Z86008 Personal history of in-situ neoplasm of other site: Secondary | ICD-10-CM | POA: Diagnosis not present

## 2020-12-27 NOTE — Progress Notes (Signed)
COMMUNITY PALLIATIVE CARE RN NOTE  PATIENT NAME: Adam Cordova DOB: 03-24-1956 MRN: [id number]  PRIMARY CARE PROVIDER: [name], MD  RESPONSIBLE PARTY: [name] (wife) Acct ID - Guarantor Home Phone Work Phone Relationship Acct Type  [id number] Idelle [phone]  Self P/F     [address] [place], [place] [address]   Covid-19 Pre-screening Negative  PLAN OF CARE and INTERVENTION:  1. ADVANCE CARE PLANNING/GOALS OF CARE: Goal is for patient to remain at home as long as possible.  2. PATIENT/CAREGIVER EDUCATION: Symptom management, safe mobility/transfers, fall prevention 3. DISEASE STATUS: Face-to-face visit completed in patient's home. Wife present during visit. Upon arrival, patient is sitting up on the couch awake and alert. He is able to answer some simple questions, but takes time to process and communicate thoughts. He does have intermittent confusion. Also continues with hallucinations where he thinks he hears people in the house. Recently he told wife "we gotta get off this train," but then noticed it was a hallucination. They do not seem to distress him. He continues to have falls. He fell a few days ago and again last night coming from the bathroom. He has a skin tear to his left lower leg and left elbow. Wife states that patient continues with hand tremors but there are times where his whole body goes into a tremor. His Carbidopa/Levodopa was changed back to immediate release. He takes this medication every 4 hours during the day. He mainly requires assistance with standing from the couch. He is able at times to stand up independently but is a high fall risk. Yesterday wife says he had a very good day. He was able to travel outside of the home with her to several places, including the grocery store, and was able to ambulate holding on to the cart. He is tired today. He usually uses wheelchair for when travelling longer distances. He does not want to use a  wheelchair at all times. It would be safer, however he wants to be able to walk for as long as possible, even if he falls. He verbalized understanding of this risks. His intake is fair. She is noticing that he is coughing more during meals and doesn't eat as fast as he used to. Wife has to assist with feeding at times. He has an adaptive spoon and fork to help. Wife states that patient recently wandered outside up the street. She was in the other room, and when she came out into the living room she noticed patient was not there. He had walked out of his garage and went up the street. When she found him, he was walking back down the street saying that he needed to get his walk in before it got dark. This is the first time he has ever done this. She is going out of town for 2 weeks in October. She is looking into respite for him and has spoken to [facility]. Will continue to monitor.  HISTORY OF PRESENT ILLNESS: This is a 65 yo male with a diagnosis of Parkinson's disease.He has a h/o dementia, BPH, narcolepsy, gait disturbance and depression.Palliative care team continues to follow patient andvisitspatient monthly and PRN.   CODE STATUS: DNR  ADVANCED DIRECTIVES: Y MOST FORM: no PPS: 30%   PHYSICAL EXAM:   VITALS: Today's Vitals   12/15/20 1134  BP: 132/89  Pulse: 67  Resp: 16  Temp: 97.6 F (36.4 C)  TempSrc: Temporal  SpO2: 95%  Weight: 169 lb 3.2  oz (76.7 kg)  Height: 5\' 10"  (1.778 m)  PainSc: 0-No pain    LUNGS: clear to auscultation  CARDIAC: Cor RRR EXTREMITIES: No edema SKIN: Skin tear to left lower leg and left elbow  NEURO: Alert and orientd to person, intermittent confusion, generalized weakness, frequent falls, ambulatory w/Balli   (Duration of visit and documentation 60 minutes)   Daryl Eastern, RN, BSN

## 2020-12-28 ENCOUNTER — Encounter: Payer: PPO | Attending: Physical Medicine & Rehabilitation | Admitting: Physical Medicine & Rehabilitation

## 2020-12-28 ENCOUNTER — Encounter: Payer: Self-pay | Admitting: Physical Medicine & Rehabilitation

## 2020-12-28 ENCOUNTER — Other Ambulatory Visit: Payer: Self-pay

## 2020-12-28 VITALS — BP 132/87 | HR 78 | Temp 98.5°F | Ht 70.0 in | Wt 170.0 lb

## 2020-12-28 DIAGNOSIS — R269 Unspecified abnormalities of gait and mobility: Secondary | ICD-10-CM | POA: Insufficient documentation

## 2020-12-28 NOTE — Progress Notes (Signed)
Subjective:    Patient ID: Adam Cordova, male    DOB: 05/24/1956, 65 y.o.   MRN: [id number] Majority of questions answered by wife due to cognitive deficits HPI CC:   65 yo male with advanced parkinson's disease referred by Neurology for Lower ext dystonia.  Because of cognitive deficits the patient cannot answer most questions.  His wife is quite helpful in this regard. Has had extensive PT , last time was summer 2021 Symptoms per wife are worse in am , Left ankle turns inward and Right leg tends to drift across left leg.  The patient does better when his shoes are on.  He has only certain shoes that he allows her to place on his feet. Patient requires assistance for all dressing and bathing activities. Parkinson's disease dx in 2013 Started using Busler for ~41yr Multiple falls without the [place] no falls with Karch  Pain Inventory Average Pain 6 Pain Right Now 2 My pain is intermittent, dull and stabbing  In the last 24 hours, has pain interfered with the following? General activity 0 Relation with others 0 Enjoyment of life 7 What TIME of day is your pain at its worst? evening Sleep (in general) Fair  Pain is worse with: standing and some activites Pain improves with: medication Relief from Meds: 6  walk with assistance use a Kelson ability to climb steps?  yes do you drive?  no use a wheelchair needs help with transfers  disabled: date disabled . retired I need assistance with the following:  feeding, dressing, bathing, meal prep, household duties and shopping  weakness tremor tingling trouble walking dizziness confusion depression anxiety loss of taste or smell  new  new    Family History  Problem Relation Age of Onset  . Dementia Mother   . Neuropathy Mother   . Heart disease Father   . Heart disease Brother    Social History   Socioeconomic History  . Marital status: Married    Spouse name: Not on file  . Number of children: 2  . Years  of education: Not on file  . Highest education level: Not on file  Occupational History  . Occupation: retired    Comment: Visual merchandiser  Tobacco Use  . Smoking status: Never Smoker  . Smokeless tobacco: Never Used  Vaping Use  . Vaping Use: Never used  Substance and Sexual Activity  . Alcohol use: Yes    Comment: 7-10 glasses of wine a week  . Drug use: No  . Sexual activity: Not on file  Other Topics Concern  . Not on file  Social History Narrative   Right handed   Lives in a two story home with wife and dog   Drinks Caffeine. One cup of coffee in the mornings   Social Determinants of Health   Financial Resource Strain: Not on file  Food Insecurity: Not on file  Transportation Needs: Not on file  Physical Activity: Not on file  Stress: Not on file  Social Connections: Not on file   Past Surgical History:  Procedure Laterality Date  . TRANSURETHRAL RESECTION OF PROSTATE N/A 11/23/2012   Procedure: TRANSURETHRAL RESECTION OF THE PROSTATE WITH GYRUS INSTRUMENTS;  Surgeon: Bernestine Amass, MD;  Location: [facility];  Service: Urology;  Laterality: N/A;  . TRANSURETHRAL RESECTION OF PROSTATE N/A 07/09/2017   Procedure: TRANSURETHRAL RESECTION OF THE PROSTATE (TURP), BIPOLAR;  Surgeon: Ceasar Mons, MD;  Location: [facility];  Service: Urology;  Laterality: N/A;   Past Medical History:  Diagnosis Date  . BPH (benign prostatic hypertrophy) with urinary retention   . Foley catheter in place   . Parkinson disease [facility])    neurologist--  dr Richardean Chimera  in [place]  . Parkinson's disease dementia ([place])   . UTI (urinary tract infection)    BP 132/87   Pulse 78   Temp 98.5 F (36.9 C)   Ht 5\' 10"  (1.778 m)   Wt 170 lb (77.1 kg)   SpO2 94%   BMI 24.39 kg/m   Opioid Risk Score:   Fall Risk Score:  `1  Depression screen PHQ 2/9  Depression screen PHQ 2/9 03/27/2016  Decreased Interest 0  Down, Depressed, Hopeless 0  PHQ - 2 Score 0     Review of Systems  Constitutional: Negative.   HENT: Negative.   Eyes: Negative.   Respiratory: Negative.   Cardiovascular: Negative.   Gastrointestinal: Positive for constipation.  Endocrine: Negative.   Genitourinary: Negative.   Musculoskeletal: Positive for gait problem.  Skin: Negative.   Allergic/Immunologic: Negative.   Neurological: Positive for dizziness, tremors and weakness.       Tingling   Hematological: Bruises/bleeds easily.  Psychiatric/Behavioral: Positive for confusion and dysphoric mood. The patient is nervous/anxious.   All other systems reviewed and are negative.      Objective:   Physical Exam Vitals and nursing note reviewed.  Constitutional:      Appearance: He is normal weight.  HENT:     Head: Normocephalic and atraumatic.  Eyes:     Extraocular Movements: Extraocular movements intact.     Conjunctiva/sclera: Conjunctivae normal.     Pupils: Pupils are equal, round, and reactive to light.  Cardiovascular:     Rate and Rhythm: Normal rate and regular rhythm.     Pulses: Normal pulses.     Heart sounds: Normal heart sounds. No murmur heard.   Pulmonary:     Effort: Pulmonary effort is normal. No respiratory distress.     Breath sounds: Normal breath sounds.  Abdominal:     General: Abdomen is flat. Bowel sounds are normal. There is no distension.     Palpations: Abdomen is soft.  Musculoskeletal:        General: No swelling or tenderness.     Right lower leg: No edema.     Left lower leg: No edema.     Comments: No evidence of swelling in the knees ankles or feet. No pain with hip knee or ankle range of motion Mild cavus deformity bilateral feet no equinus deformity.  Skin:    General: Skin is warm and dry.  Neurological:     Mental Status: He is alert and oriented to person, place, and time.     Cranial Nerves: Dysarthria present. No facial asymmetry.     Deep Tendon Reflexes:     Reflex Scores:      Tricep reflexes are 2+ on  the right side and 2+ on the left side.      Bicep reflexes are 1+ on the right side and 1+ on the left side.      Brachioradialis reflexes are 1+ on the right side and 1+ on the left side.      Patellar reflexes are 0 on the right side and 0 on the left side.      Achilles reflexes are 0 on the right side and 0 on the left side.    Comments: Due to cognitive  deficits patient could not cooperate with sensory examination Patient oriented to person and physician office however he thought he was in his neurology office.  Not oriented to time Hypophonic hyperkinetic dysarthria  No evidence of hypertonicity in the lower extremities no evidence of clonus at the ankles.  Gait requires Can.  He has no evidence of stifflegged gait or scissoring during ambulation.  His right foot does turn inward appears to be from the hip area rather than foot supination, patient does festinate has some freezing with turns The patient has no evidence of increased tone in the hip adductor's  Psychiatric:        Mood and Affect: Mood normal.        Behavior: Behavior normal.           Assessment & Plan:  #1.  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 I discussed with the patient and wife that there does not appear to be any significant spasticity or hypertonicity.  I do not think Botox would be helpful in the situation. We discussed potential for bracing but patient's wife is convinced that he would not be compliant with this. We also discussed physical therapy last done in 2020 however because of his cognitive deficits I really do not think it would carryover in terms of functional improvements. Patient will follow-up with Dr. Carles Collet from neurology. Patient will follow up with physical medicine and rehab as needed

## 2020-12-28 NOTE — Patient Instructions (Signed)
Do not think botox is helpful in this situation

## 2020-12-30 ENCOUNTER — Other Ambulatory Visit: Payer: Self-pay | Admitting: Neurology

## 2021-01-18 ENCOUNTER — Other Ambulatory Visit: Payer: Self-pay | Admitting: Neurology

## 2021-01-23 ENCOUNTER — Other Ambulatory Visit: Payer: PPO | Admitting: *Deleted

## 2021-01-23 ENCOUNTER — Other Ambulatory Visit: Payer: Self-pay

## 2021-01-23 DIAGNOSIS — Z515 Encounter for palliative care: Secondary | ICD-10-CM

## 2021-01-23 NOTE — Progress Notes (Signed)
COMMUNITY PALLIATIVE CARE RN NOTE  PATIENT NAME: Adam Cordova DOB: 07/14/1956 MRN: [id number]  PRIMARY CARE PROVIDER: [name], MD  RESPONSIBLE PARTY: [name] (wife) Acct ID - Guarantor Home Phone Work Phone Relationship Acct Type  [id number] DAEMION, MCNIEL* [phone]  Self P/F     [address] [place], [place], [place] [address]   Due to the COVID-19 crisis, this virtual check-in visit was done via telephone from my office and it was initiated and consent by this patient and or family.  PLAN OF CARE and INTERVENTION:  1. ADVANCE CARE PLANNING/GOALS OF CARE: Goal is for patient to remain at home with his wife for as long as possible. He has a DNR. 2. PATIENT/CAREGIVER EDUCATION: Symptom management, safe mobility/transfers, s/s of infection 3. DISEASE STATUS: Virtual check-in visit completed via telephone. Patient denies pain at this time. Wife reports that patient continues to fall. They recently took a trip to the beach and he had a bad fall down the stairs. He hit the back of his head and has several scrapes and scratches on his legs. Wife says that he did not have any issues from hitting his head. Patient continues to want to ambulate using his Keiper vs propelling himself around in a wheelchair. Wife states that he has at least agreed to always use his Jobst, as sometimes he tries to walk without it. She denies any additional instances of patient wandering outside of the home. He was recently seen at [facility] due to his neurologic gait disorder due to Parkinson's. MD did not feel that physical therapy would be helpful given patient's cognitive decline. He continues with some visual and auditory hallucinations, which do not distress him. He is progressively weaker and requires assistance with all ADLs, except he is able to feed himself most days. His wife does have to assist him with eating at times when his tremors are more profound.  He has weighted utensils to help during meals. His intake is variable, but fair. He has increased tremors usually in the evenings and can last throughout the night. He continues on the immediate release Carbidopa/Levodopa and has an extra tablet she can give if tremors seem uncontrolled. She feels the that the medication is helpful, but is short-lived.  He continues with a private caregiver 4 days/week. Wife states she is a big help and is working out well. Wife is still scheduled to take a vacation out of the country in September. She currently has a reservation for a patient respite at [place]. Will continue to monitor.  HISTORY OF PRESENT ILLNESS: This is a 65 yo male with a diagnosis of Parkinson's disease.He has a h/o dementia, BPH, narcolepsy, gait disturbance and depression.Palliative care team continues to follow patient andvisitspatient monthly and PRN.    CODE STATUS: DNR ADVANCED DIRECTIVES: Y MOST FORM: no PPS: 30%   (Duration of visit and documentation 30 minutes)   Daryl Eastern, RN BSN

## 2021-02-05 ENCOUNTER — Other Ambulatory Visit: Payer: Self-pay | Admitting: Neurology

## 2021-02-05 MED ORDER — CARBIDOPA-LEVODOPA 25-100 MG PO TABS: ORAL_TABLET | ORAL | 1 refills | Status: DC

## 2021-02-13 ENCOUNTER — Other Ambulatory Visit: Payer: Self-pay | Admitting: Neurology

## 2021-02-17 ENCOUNTER — Other Ambulatory Visit: Payer: Self-pay | Admitting: Neurology

## 2021-02-19 ENCOUNTER — Ambulatory Visit: Payer: PPO | Admitting: Neurology

## 2021-02-20 ENCOUNTER — Other Ambulatory Visit: Payer: PPO

## 2021-02-20 ENCOUNTER — Other Ambulatory Visit: Payer: Self-pay

## 2021-02-20 DIAGNOSIS — Z515 Encounter for palliative care: Secondary | ICD-10-CM

## 2021-02-20 NOTE — Progress Notes (Signed)
COMMUNITY PALLIATIVE CARE SW NOTE  PATIENT NAME: Adam Cordova DOB: 1956-04-12 MRN: [id number]  PRIMARY CARE PROVIDER: [name], MD  RESPONSIBLE PARTY:  Acct ID - Guarantor Home Phone Work Phone Relationship Acct Type  [id number] Adam Cordova* [phone]  Self P/F     [address] [place], [place] [address]     PLAN OF CARE and INTERVENTIONS:             GOALS OF CARE/ ADVANCE CARE PLANNING:  Goal is for patient to remain at home. Patient is a DNR and form is in the home.  SOCIAL/EMOTIONAL/SPIRITUAL ASSESSMENT/ INTERVENTIONS: SW completed a telephonic visit with patient and his wife-Adam Cordova was present with him. She provided an update on patient status. Patient is sleeping 17-20 hours per day, only getting up for meals. He is having increased constipation that last 2-4 days. When he finally has a bowel movement, patient has a large amount and is not able to control it. His wife report that she is doing more incontinent care with patient usually due to large bowel movements. Patient has several falls. SW observed several bruises and skin tears to his legs and arms as a result of these falls. His wife report that patient told her that he is forgetting how to walk. She also believe this was true, along with ongoing weakness. His appetite has decreased, as his overall intake has declined, but when he eats he wants more sweets. Patient is having more foot pain and swelling as a result of his falls.  His hallucinations continue particularly in the evening. PCG is still planning a trip out of the country in August. PCG has a tentative plan to place patient at [facility] for a 30 day respite during this time. PCG report that she is torn with placing patient permanently, but knows that he may need, as well as her children support placement. PCG is not ready for placement. SW discussed hospice with her, which she (PCG) was open to when patient is appropriate.  PATIENT/CAREGIVER EDUCATION/  COPING:  PCG and patient is coping well. PERSONAL EMERGENCY PLAN:  911 can be activated for emergencies. COMMUNITY RESOURCES COORDINATION/ HEALTH CARE NAVIGATION:  Patient receiving personal caregiver support through [facility], M-Thurs. 10:30 am -4:30 pm. FINANCIAL/LEGAL CONCERNS/INTERVENTIONS:  None.     SOCIAL HX:  Social History   Tobacco Use   Smoking status: Never   Smokeless tobacco: Never  Substance Use Topics   Alcohol use: Yes    Comment: 7-10 glasses of wine a week    CODE STATUS: DNR ADVANCED DIRECTIVES: Yes MOST FORM COMPLETE: No HOSPICE EDUCATION PROVIDED: No  PPS: Patient having difficulty ambulating and has had multiple falls. He remains at high risk for falls due to complications and progression of his parkinson's disease and increased generalized weakness.  He is sleeping more overall and having increased hallucinations.   Duration of visit and documentation: 60 minutes      Katheren Puller, LCSW

## 2021-02-23 NOTE — Progress Notes (Signed)
Assessment/Plan:   1.  Parkinsons Disease with PDD (dx 2010)  -He will take carbidopa/levodopa 25/100 IR, 3 in the AM, 2 at 10am, 2 at 2pm, 2 at 6pm.  Told patient's wife that she could still give him an extra if needed throughout the day.  -add carbidopa/levodopa 50/200 CR at bed.  When his wife tried to give him extra of the IR, she noted that he just "acted crazy at bedtime."  -discussed long term living situation.  Met with my lcsw today  -palliative following  -if above doesn't help, will try inbjria.  Think that this may be difficult for patient to use logistically.   2.  CIDP  -No longer using IVIG.  -Under the care of Dr. Posey Pronto  3.  PDD and depression  -On quetiapine, 25 mg twice per day but still with significant hallucinations.  Discussed increasing this, and after some discussion decided to increase bed to 50 mg x  2 weeks and then if still with day hallucinations, will increase to 50 mg bid.  Wife does not think that this is the source of the sleepiness, as she reports that he really has always been sleepy.  -Patient under palliative care  4.  Dysphagia  - Modified barium swallow was done on October 06, 2019 for cough with questionable dysphagia.  There was mild oral greater than pharyngeal dysphagia.  Patient felt to be a mild aspiration risk.  Home medication with pure recommended along with out patient speech.   5.  B12 deficiency  -On oral supplementation  6.  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  Patient using Myrbetriq.  7.  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  Will monitor.  8.  Left foot dystonia  -Sent to Dr. Letta Pate, but he did not think that Botox would be helpful.  9.  Constipation  -discussed nature and pathophysiology and association with PD  -discussed importance of hydration.  Pt is to increase water intake  -pt is given a copy of the rancho recipe  -recommended daily  colace  -recommended miralax prn   Subjective:   Adam Cordova was seen today in follow up for Parkinsons disease.  My previous records were reviewed prior to todays visit as well as outside records available to me.  Patient has advanced Parkinson's disease with PDD.  Last visit, we did try to change to the immediate release form of the medication, to see if we would get better motor control without change in his cognition.  The report that still have the flapping tremor - can be associated with anxiety or can do it on its own when trying to sleep at night.  I referred him to Dr. Letta Pate for left foot dystonia, but he did not think Botox would be helpful.  Palliative care is following with the patient.  Wife states that he got very upset after our talk last visit but they are going to trial a month respite so wife can do a trip with girlfriends (carriage house).  Last saw him June 21.  Notes are reviewed.  Hallucinations are bad in the AM - "we have to get off of the boat; we have to get out of the hotel."  That goes on until 11am.  Has had lot of falls.  Had some loss of bowel control when visiting the son out of state.  Had been giving medication for constipation when it happened.  Current prescribed  movement disorder medications:  Carbidopa/levodopa 25/100 IR, up to 9 tablets/day (3 in the AM, 2 at 10am, 2 at 2pm, 2 at 6pm, extra prn) -changed from the CR to see if we would get better motor control without worsening cognition Quetiapine 25 mg twice per day  B12 supplement  PREVIOUS MEDICATIONS:  Sinemet, Mirapex and exelon patch; Provigil; entacapone  ALLERGIES:   Allergies  Allergen Reactions   Bee Venom Anaphylaxis   Levaquin [Levofloxacin Hemihydrate] Anaphylaxis and Swelling   Sertraline Diarrhea and Nausea Only    CURRENT MEDICATIONS:  Outpatient Encounter Medications as of 02/27/2021  Medication Sig   carbidopa-levodopa (SINEMET IR) 25-100 MG tablet 3 in the AM, 2 at 10am, 2 at  2pm, 2 at 6pm   Cyanocobalamin (B-12 PO) Take by mouth daily.   donepezil (ARICEPT) 10 MG tablet Take 1 tablet (10 mg total) by mouth at bedtime.   MYRBETRIQ 25 MG TB24 tablet Take 25 mg by mouth daily.   naproxen sodium (ALEVE) 220 MG tablet Take 440 mg by mouth 2 (two) times daily as needed (for pain.).   QUEtiapine (SEROQUEL) 25 MG tablet TAKE 1 TABLET BY MOUTH TWICE A DAY   Tamsulosin HCl (FLOMAX) 0.4 MG CAPS Take 0.4 mg by mouth 2 (two) times daily.    No facility-administered encounter medications on file as of 02/27/2021.    Objective:   PHYSICAL EXAMINATION:    VITALS:   Vitals:   02/27/21 0838  BP: (!) 120/58  Pulse: 72  SpO2: 97%     Wt Readings from Last 3 Encounters:  12/28/20 170 lb (77.1 kg)  12/15/20 169 lb 3.2 oz (76.7 kg)  11/21/20 171 lb (77.6 kg)     GEN:  The patient appears stated age and is in NAD. HEENT:  Normocephalic, atraumatic.   Neurological examination:  Orientation: The patient is alert when doing the exam, but otherwise sleeps throughout most of the visit. Cranial nerves: There is good facial symmetry with facial hypomimia. The speech is fluent and clear but hypophonic. Soft palate rises symmetrically and there is no tongue deviation. Hearing is intact to conversational tone. Sensation: Sensation is intact to light touch throughout   Movement examination: Tone: There is normal tone in the UE/LE Abnormal movements: No tremor noted today. Coordination:  There is mild decremation with RAM's, but more apraxia than decremation. Gait and Station: The patient is unstable when he ambulates, even with a Sica.  If interrupted verbally, he becomes very off balance and nearly falls.  He has dystonia of the left foot as he ambulates.    I have reviewed and interpreted the following labs independently    Chemistry      Component Value Date/Time   NA 140 12/16/2018 1029   K 4.5 12/16/2018 1029   CL 105 12/16/2018 1029   CO2 28 12/16/2018 1029    BUN 17 12/16/2018 1029   CREATININE 1.06 12/16/2018 1029      Component Value Date/Time   CALCIUM 9.4 12/16/2018 1029   AST 13 12/16/2018 1029   ALT 6 (L) 12/16/2018 1029   BILITOT 0.8 12/16/2018 1029       Lab Results  Component Value Date   WBC 9.6 12/16/2018   HGB 14.8 12/16/2018   HCT 46.5 12/16/2018   MCV 83.6 12/16/2018   PLT 268 12/16/2018    Lab Results  Component Value Date   TSH 0.49 07/24/2018     Total time spent on today's visit was 35 minutes, including  both face-to-face time and nonface-to-face time.  Time included that spent on review of records (prior notes available to me/labs/imaging if pertinent), discussing treatment and goals, answering patient's questions and coordinating care.  This was my time alone and not the time with the LCSW.  Cc:  [name], MD

## 2021-02-27 ENCOUNTER — Ambulatory Visit: Payer: PPO | Admitting: Neurology

## 2021-02-27 ENCOUNTER — Other Ambulatory Visit: Payer: Self-pay

## 2021-02-27 ENCOUNTER — Encounter: Payer: Self-pay | Admitting: Neurology

## 2021-02-27 VITALS — BP 120/58 | HR 72

## 2021-02-27 DIAGNOSIS — G249 Dystonia, unspecified: Secondary | ICD-10-CM | POA: Diagnosis not present

## 2021-02-27 DIAGNOSIS — F0280 Dementia in other diseases classified elsewhere without behavioral disturbance: Secondary | ICD-10-CM

## 2021-02-27 DIAGNOSIS — R441 Visual hallucinations: Secondary | ICD-10-CM | POA: Diagnosis not present

## 2021-02-27 DIAGNOSIS — D044 Carcinoma in situ of skin of scalp and neck: Secondary | ICD-10-CM | POA: Diagnosis not present

## 2021-02-27 DIAGNOSIS — G20 Parkinson's disease: Secondary | ICD-10-CM | POA: Diagnosis not present

## 2021-02-27 MED ORDER — CARBIDOPA-LEVODOPA ER 50-200 MG PO TBCR: 1.0000 | EXTENDED_RELEASE_TABLET | Freq: Every day | ORAL | 1 refills | Status: DC

## 2021-02-27 MED ORDER — QUETIAPINE FUMARATE 25 MG PO TABS: ORAL_TABLET | ORAL | 1 refills | Status: DC

## 2021-02-27 NOTE — Patient Instructions (Addendum)
Continue carbidopa/levodopa 25/100,  3 in the AM, 2 at 10am, 2 at 2pm, 2 at 6pm START carbidopa/levodopa 50/200 CR AT bed 2 days after that, increase quetiapine, 25 mg, 2 tablets at bed and 1 in the AM for 2 weeks and then if still having lots of hallucinations, you can trial 2 tablets twice per day  Constipation and Parkinson's disease:  1.Rancho recipe for constipation in Parkinsons Disease:  -1 cup of unprocessed bran (need to get this at [employer], [employer] or similar type of store), 2 cups of applesauce in 1 cup of prune juice 2.  Increase fiber intake (Metamucil,vegetables) 3.  Regular, moderate exercise can be beneficial. 4.  Avoid medications causing constipation, such as medications like antacids with calcium or magnesium 5.  It's okay to take daily Miralax, and taper if stools become too loose or you experience diarrhea 6.  Stool softeners (Colace) can help with chronic constipation and I recommend you take this daily. 7.  Increase water intake.  You should be drinking 1/2 gallon of water a day as long as you have not been diagnosed with congestive heart failure or renal/kidney failure.  This is probably the single greatest thing that you can do to help your constipation.

## 2021-04-02 ENCOUNTER — Other Ambulatory Visit: Payer: Self-pay | Admitting: Neurology

## 2021-04-03 ENCOUNTER — Other Ambulatory Visit: Payer: PPO

## 2021-04-03 ENCOUNTER — Other Ambulatory Visit: Payer: Self-pay

## 2021-04-03 DIAGNOSIS — Z515 Encounter for palliative care: Secondary | ICD-10-CM

## 2021-04-04 NOTE — Progress Notes (Signed)
COMMUNITY PALLIATIVE CARE SW NOTE  PATIENT NAME: Adam Cordova DOB: 03-Sep-1955 MRN: [id number]  PRIMARY CARE PROVIDER: [name], MD  RESPONSIBLE PARTY:  Acct ID - Guarantor Home Phone Work Phone Relationship Acct Type  [id number] Idelle [phone]  Self P/F     [address] [place], [place], [place] [id number]   Due to the COVID-19 crisis, this virtual check-in visit was done via telephone from my office and it was initiated and consent by this patient and or family.   PLAN OF CARE and INTERVENTIONS:             GOALS OF CARE/ ADVANCE CARE PLANNING:  Goal is for patient to have a successful respite stay. Patient is a DNR. SOCIAL/EMOTIONAL/SPIRITUAL ASSESSMENT/ INTERVENTIONS:  SW completed a telephonic visit with wife-[name]. She advised that patient continues to decline. He is forgetting how to walk and his aphasia is also worsening. Patient thinks he is talking, but his verbalizations are nonsensical. His sundowning  is also worsening. He continues to have multiple falls weekly without any significant injuries. He sleeping more overall 15-17 hours a day. Patient continues to have a sitter 4x's week. Patient scheduled to go to [facility] for a 30 day respite on 04/27/21. SW provided reassurance of support.  PATIENT/CAREGIVER EDUCATION/ COPING:  Overall PCG is coping well. She is looking forward having some time to herself while patient is in respite. PERSONAL EMERGENCY PLAN:  911 can be activated for emergencies. COMMUNITY RESOURCES COORDINATION/ HEALTH CARE NAVIGATION:  Patient has a private caregiver 4x's week.  FINANCIAL/LEGAL CONCERNS/INTERVENTIONS:  None.     SOCIAL HX:  Social History   Tobacco Use   Smoking status: Never   Smokeless tobacco: Never  Substance Use Topics   Alcohol use: Yes    Comment: 7-10 glasses of wine a week    CODE STATUS: DNR ADVANCED DIRECTIVES: Yes MOST FORM COMPLETE: No HOSPICE EDUCATION PROVIDED: No  PPS: Patient having  difficulty ambulating and has had multiple falls. He remains at high risk for falls due to complications and progression of his parkinson's disease and increased generalized weakness.  He is sleeping more overall and having increased hallucinations.   Duration of telephonic visit and documentation: 30 minutes      Katheren Puller, LCSW

## 2021-04-17 DIAGNOSIS — Z111 Encounter for screening for respiratory tuberculosis: Secondary | ICD-10-CM | POA: Diagnosis not present

## 2021-04-17 DIAGNOSIS — Z23 Encounter for immunization: Secondary | ICD-10-CM | POA: Diagnosis not present

## 2021-04-20 DIAGNOSIS — Z111 Encounter for screening for respiratory tuberculosis: Secondary | ICD-10-CM | POA: Diagnosis not present

## 2021-04-25 ENCOUNTER — Other Ambulatory Visit: Payer: Self-pay | Admitting: Neurology

## 2021-05-03 DIAGNOSIS — N400 Enlarged prostate without lower urinary tract symptoms: Secondary | ICD-10-CM | POA: Diagnosis not present

## 2021-05-03 DIAGNOSIS — R2681 Unsteadiness on feet: Secondary | ICD-10-CM | POA: Diagnosis not present

## 2021-05-03 DIAGNOSIS — I10 Essential (primary) hypertension: Secondary | ICD-10-CM | POA: Diagnosis not present

## 2021-05-03 DIAGNOSIS — D692 Other nonthrombocytopenic purpura: Secondary | ICD-10-CM | POA: Diagnosis not present

## 2021-05-03 DIAGNOSIS — S41112A Laceration without foreign body of left upper arm, initial encounter: Secondary | ICD-10-CM | POA: Diagnosis not present

## 2021-05-03 DIAGNOSIS — G6181 Chronic inflammatory demyelinating polyneuritis: Secondary | ICD-10-CM | POA: Diagnosis not present

## 2021-05-03 DIAGNOSIS — G20 Parkinson's disease: Secondary | ICD-10-CM | POA: Diagnosis not present

## 2021-05-04 DIAGNOSIS — G20 Parkinson's disease: Secondary | ICD-10-CM | POA: Diagnosis not present

## 2021-05-04 DIAGNOSIS — G6181 Chronic inflammatory demyelinating polyneuritis: Secondary | ICD-10-CM | POA: Diagnosis not present

## 2021-05-04 DIAGNOSIS — D692 Other nonthrombocytopenic purpura: Secondary | ICD-10-CM | POA: Diagnosis not present

## 2021-05-04 DIAGNOSIS — I10 Essential (primary) hypertension: Secondary | ICD-10-CM | POA: Diagnosis not present

## 2021-05-04 DIAGNOSIS — S51812D Laceration without foreign body of left forearm, subsequent encounter: Secondary | ICD-10-CM | POA: Diagnosis not present

## 2021-05-04 DIAGNOSIS — N400 Enlarged prostate without lower urinary tract symptoms: Secondary | ICD-10-CM | POA: Diagnosis not present

## 2021-05-04 DIAGNOSIS — Z9181 History of falling: Secondary | ICD-10-CM | POA: Diagnosis not present

## 2021-05-04 DIAGNOSIS — E785 Hyperlipidemia, unspecified: Secondary | ICD-10-CM | POA: Diagnosis not present

## 2021-05-15 ENCOUNTER — Emergency Department (HOSPITAL_COMMUNITY)
Admission: EM | Admit: 2021-05-15 | Discharge: 2021-05-15 | Disposition: A | Discharge status: Home / Self Care | Payer: PPO | Attending: Emergency Medicine | Admitting: Emergency Medicine

## 2021-05-15 ENCOUNTER — Emergency Department (HOSPITAL_COMMUNITY): Payer: PPO

## 2021-05-15 ENCOUNTER — Encounter (HOSPITAL_COMMUNITY): Payer: Self-pay

## 2021-05-15 DIAGNOSIS — G20 Parkinson's disease: Secondary | ICD-10-CM | POA: Insufficient documentation

## 2021-05-15 DIAGNOSIS — M1611 Unilateral primary osteoarthritis, right hip: Secondary | ICD-10-CM | POA: Diagnosis not present

## 2021-05-15 DIAGNOSIS — M549 Dorsalgia, unspecified: Secondary | ICD-10-CM | POA: Diagnosis not present

## 2021-05-15 DIAGNOSIS — W19XXXA Unspecified fall, initial encounter: Secondary | ICD-10-CM | POA: Diagnosis not present

## 2021-05-15 DIAGNOSIS — G319 Degenerative disease of nervous system, unspecified: Secondary | ICD-10-CM | POA: Diagnosis not present

## 2021-05-15 DIAGNOSIS — R9082 White matter disease, unspecified: Secondary | ICD-10-CM | POA: Diagnosis not present

## 2021-05-15 DIAGNOSIS — M25512 Pain in left shoulder: Secondary | ICD-10-CM | POA: Diagnosis not present

## 2021-05-15 DIAGNOSIS — M19012 Primary osteoarthritis, left shoulder: Secondary | ICD-10-CM | POA: Diagnosis not present

## 2021-05-15 DIAGNOSIS — F0390 Unspecified dementia without behavioral disturbance: Secondary | ICD-10-CM | POA: Diagnosis not present

## 2021-05-15 DIAGNOSIS — M25552 Pain in left hip: Secondary | ICD-10-CM | POA: Diagnosis not present

## 2021-05-15 DIAGNOSIS — R519 Headache, unspecified: Secondary | ICD-10-CM | POA: Diagnosis not present

## 2021-05-15 DIAGNOSIS — Z79899 Other long term (current) drug therapy: Secondary | ICD-10-CM | POA: Insufficient documentation

## 2021-05-15 DIAGNOSIS — I10 Essential (primary) hypertension: Secondary | ICD-10-CM | POA: Diagnosis not present

## 2021-05-15 NOTE — ED Provider Notes (Signed)
Emergency Medicine Provider Triage Evaluation Note  Adam Cordova , a 65 y.o. male  was evaluated in triage.  Pt complains of unwitnessed fall at his facility.  Patient with Parkinson's dementia unable to give any insight into his fall history.  Review of Systems  Positive: Pain in the top of his head, pain in the left shoulder Negative: Blurry or double vision *Review of systems unreliable given patient's history of dementia and nonsensical speech at time of exam*  Physical Exam  BP (!) 169/97 (BP Location: Right Arm)   Pulse 80   Temp 99.2 F (37.3 C) (Oral)   Resp 16   SpO2 100%  Gen:   Awake, no distress   Resp:  Normal effort  MSK:   Moves extremities without difficulty  Other:  Small hematoma to the very top of the head, bruising over the posterior left shoulder with full range of motion of the shoulder.  Medical Decision Making  Medically screening exam initiated at 12:31 PM.  Appropriate orders placed.  NAVEEN PETRY was informed that the remainder of the evaluation will be completed by another provider, this initial triage assessment does not replace that evaluation, and the importance of remaining in the ED until their evaluation is complete.  We will proceed with imaging at this time, ultimately a provider will need to speak with patient's facility for further insight into his fall.  This chart was dictated using voice recognition software, Dragon. Despite the best efforts of this provider to proofread and correct errors, errors may still occur which can change documentation meaning.    Emeline Darling, PA-C 05/15/21 [place], DO 05/15/21 1636

## 2021-05-15 NOTE — Discharge Instructions (Signed)
Follow up with your family doc.  Return for worsening pain, recurrent falls.

## 2021-05-15 NOTE — ED Triage Notes (Signed)
Per EMS-mechanical fall-patient is from [facility] not witnessed by staff-patient does not remember fall-CBG 112-was getting out of bed and then remembers being on the floor-pain resolved once EMS arrived and picked him off floor

## 2021-05-15 NOTE — ED Provider Notes (Signed)
[facility] DEPT Provider Note   CSN: [id number] Arrival date & time: 05/15/21  1137     History Chief Complaint  Patient presents with   Adam Cordova is a 65 y.o. male.  65 yo M with a chief complaints of a fall.  Patient has a history of Parkinson's and has had progressive dementia.  He is not sure exactly what happened.  Initial complaints hand shoulder and hip pain.  Level 5 caveat dementia.   Fall      Past Medical History:  Diagnosis Date   BPH (benign prostatic hypertrophy) with urinary retention    Foley catheter in place    Parkinson disease [facility])    neurologist--  dr Marcello Moores hickey  in [place]   Parkinson's disease dementia [facility])    UTI (urinary tract infection)     Patient Active Problem List   Diagnosis Date Noted   Complex laceration of ear 03/25/2019   Closed displaced fracture of shaft of fourth metacarpal bone of left hand 09/16/2018   Parkinson's disease ([place]) 10/17/2017   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[place]) 10/17/2017   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   BPH with obstruction/lower urinary tract symptoms 07/09/2017   Gait disturbance 09/18/2016   Poor balance 09/18/2016   Depression 06/11/2016   Excessive sleepiness 06/11/2016   Auditory hallucination 06/11/2016   Hallucination, visual 06/11/2016   Metatarsal fracture 03/27/2015   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   Acute urinary retention 11/23/2012   BPH (benign prostatic hyperplasia) 11/23/2012   Enlarged prostate without lower urinary tract symptoms (luts) 11/23/2012   Parkinson disease ([place]) 05/18/2012    Past Surgical History:  Procedure Laterality Date   TRANSURETHRAL RESECTION OF PROSTATE N/A 11/23/2012   Procedure: TRANSURETHRAL RESECTION OF THE PROSTATE WITH GYRUS INSTRUMENTS;  Surgeon: Bernestine Amass, MD;  Location: [facility];  Service: Urology;  Laterality: N/A;    TRANSURETHRAL RESECTION OF PROSTATE N/A 07/09/2017   Procedure: TRANSURETHRAL RESECTION OF THE PROSTATE (TURP), BIPOLAR;  Surgeon: Ceasar Mons, MD;  Location: [facility];  Service: Urology;  Laterality: N/A;       Family History  Problem Relation Age of Onset   Dementia Mother    Neuropathy Mother    Heart disease Father    Heart disease Brother     Social History   Tobacco Use   Smoking status: Never   Smokeless tobacco: Never  Vaping Use   Vaping Use: Never used  Substance Use Topics   Alcohol use: Yes    Comment: 7-10 glasses of wine a week   Drug use: No    Home Medications Prior to Admission medications   Medication Sig Start Date End Date Taking? Authorizing Provider  carbidopa-levodopa (SINEMET CR) 50-200 MG tablet Take 1 tablet by mouth at bedtime. Patient taking differently: Take 1 tablet by mouth at bedtime. Scheduled for 9pm each night 02/27/21  Yes Tat, Eustace Quail, DO  carbidopa-levodopa (SINEMET IR) 25-100 MG tablet 3 in the AM, 2 at 10am, 2 at 2pm, 2 at 6pm Patient taking differently: Take 2-3 tablets by mouth in the morning, at noon, in the evening, and at bedtime. 3 tablets at 7am, 2 tablets at 10am, 2 tablets at 2pm, and 2 tablets at 6pm. 02/05/21  Yes Tat, Wells Guiles S, DO  donepezil (ARICEPT) 10 MG tablet Take 1 tablet (10 mg total) by mouth at bedtime. 06/22/20  Yes Tat, Eustace Quail, DO  EPINEPHrine 0.3 mg/0.3 mL IJ SOAJ injection Inject 0.3 mg into the muscle as needed for anaphylaxis.   Yes [provider]  MYRBETRIQ 25 MG TB24 tablet Take 25 mg by mouth daily. 12/24/19  Yes [provider]  naproxen sodium (ALEVE) 220 MG tablet Take 440 mg by mouth 2 (two) times daily as needed (for pain.).   Yes [provider]  QUEtiapine (SEROQUEL) 25 MG tablet 1 in the AM, 2 at bed for a week and then 2 po bid as directed Patient taking differently: Take 25 mg by mouth 2 (two) times daily. 02/27/21  Yes Tat, Eustace Quail, DO  Tamsulosin HCl  (FLOMAX) 0.4 MG CAPS Take 0.4 mg by mouth 2 (two) times daily.    Yes [provider]  traMADol (ULTRAM) 50 MG tablet Take 50 mg by mouth 2 (two) times daily as needed for moderate pain.   Yes [provider]  vitamin B-12 (CYANOCOBALAMIN) 1000 MCG tablet Take 1,000 mcg by mouth daily.   Yes [provider]    Allergies    Bee venom, Levaquin [levofloxacin hemihydrate], and Sertraline  Review of Systems   Review of Systems  Unable to perform ROS: Dementia   Physical Exam Updated Vital Signs BP (!) 169/97 (BP Location: Right Arm)   Pulse 80   Temp 99.2 F (37.3 C) (Oral)   Resp 16   SpO2 100%   Physical Exam Vitals and nursing note reviewed.  Constitutional:      Appearance: He is well-developed.  HENT:     Head: Normocephalic and atraumatic.  Eyes:     Pupils: Pupils are equal, round, and reactive to light.  Neck:     Vascular: No JVD.  Cardiovascular:     Rate and Rhythm: Normal rate and regular rhythm.     Heart sounds: No murmur heard.   No friction rub. No gallop.  Pulmonary:     Effort: No respiratory distress.     Breath sounds: No wheezing.  Abdominal:     General: There is no distension.     Tenderness: There is no abdominal tenderness. There is no guarding or rebound.  Musculoskeletal:        General: Normal range of motion.     Cervical back: Normal range of motion and neck supple.     Comments: No obvious signs of trauma with palpation from head to toe.  Skin:    Coloration: Skin is not pale.     Findings: No rash.  Neurological:     Mental Status: He is alert.     Comments: Initially seems oriented and speaks in complete gibberish and then at times talks about things inappropriately.  Cogwheeling on exam.  Psychiatric:        Behavior: Behavior normal.    ED Results / Procedures / Treatments   Labs (all labs ordered are listed, but only abnormal results are displayed) Labs Reviewed - No data to  display  EKG None  Radiology CT Head Wo Contrast  Result Date: 05/15/2021 CLINICAL DATA:  Unwitnessed fall. EXAM: CT HEAD WITHOUT CONTRAST TECHNIQUE: Contiguous axial images were obtained from the base of the skull through the vertex without intravenous contrast. COMPARISON:  Brain MRI 05/14/2017 FINDINGS: Brain: Mild age advanced cerebral atrophy, ventriculomegaly and periventricular white matter disease. No extra-axial fluid collections are identified. No CT findings for acute hemispheric infarction or intracranial hemorrhage. No mass lesions. The brainstem and cerebellum are normal. Vascular: No hyperdense vessel or unexpected calcification.  Skull: No skull fracture or bone lesions. Sinuses/Orbits: The paranasal sinuses mastoid air cells are clear. Other: No scalp lesions or scalp hematoma. IMPRESSION: 1. Mild age advanced cerebral atrophy, ventriculomegaly and periventricular white matter disease. 2. No acute intracranial findings or skull fracture. Electronically Signed   By: Marijo Sanes M.D.   On: 05/15/2021 14:03   DG Shoulder Left  Result Date: 05/15/2021 CLINICAL DATA:  Fall, pain EXAM: LEFT SHOULDER - 2+ VIEW COMPARISON:  None. FINDINGS: There is mild glenohumeral and moderate AC joint degenerative change. Subacromial spurring. There is no acute fracture or dislocation. IMPRESSION: No evidence of acute fracture or dislocation. 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 Electronically Signed   By: Maurine Simmering M.D.   On: 05/15/2021 13:54    Procedures Procedures   Medications Ordered in ED Medications - No data to display  ED Course  I have reviewed the triage vital signs and the nursing notes.  Pertinent labs & imaging results that were available during my care of the patient were reviewed by me and considered in my medical decision making (see chart for details).    MDM Rules/Calculators/A&P                           65 yo M with a cc of a fall.  Patient lives  at a assisted living facility.  Has difficulty ambulating and trouble with confusion at baseline due to worsening Parkinson's.  He is unsure exactly what happened and not able to describe it.  Reportedly had bumped his head and was having pain to the left shoulder and hip.  No obvious pain or deformity for me on exam.  No obvious signs of bruising to the head.  Awaiting imaging.  Plain film of the shoulder and left hip viewed by me without fracture.  CT scan of the head is negative.  Will discharge patient home.  PCP follow-up.  2:14 PM:  I have discussed the diagnosis/risks/treatment options with the patient and believe the pt to be eligible for discharge home to follow-up with PCP. We also discussed returning to the ED immediately if new or worsening sx occur. We discussed the sx which are most concerning (e.g., sudden worsening pain, fever, inability to tolerate by mouth) that necessitate immediate return. Medications administered to the patient during their visit and any new prescriptions provided to the patient are listed below.  Medications given during this visit Medications - No data to display   The patient appears reasonably screen and/or stabilized for discharge and I doubt any other medical condition or other [facility] requiring further screening, evaluation, or treatment in the ED at this time prior to discharge.   Final Clinical Impression(s) / ED Diagnoses Final diagnoses:  Fall, initial encounter    Rx / DC Orders ED Discharge Orders     None        Deno Etienne, DO 05/15/21 1415

## 2021-05-15 NOTE — ED Notes (Signed)
Telephone report given to [facility]. Patient left with family in [place] to be taken back

## 2021-05-17 ENCOUNTER — Other Ambulatory Visit: Payer: PPO

## 2021-05-17 ENCOUNTER — Other Ambulatory Visit: Payer: Self-pay

## 2021-05-17 DIAGNOSIS — G20 Parkinson's disease: Secondary | ICD-10-CM | POA: Diagnosis not present

## 2021-05-17 DIAGNOSIS — I10 Essential (primary) hypertension: Secondary | ICD-10-CM | POA: Diagnosis not present

## 2021-05-17 DIAGNOSIS — S51812D Laceration without foreign body of left forearm, subsequent encounter: Secondary | ICD-10-CM | POA: Diagnosis not present

## 2021-05-17 DIAGNOSIS — Z515 Encounter for palliative care: Secondary | ICD-10-CM

## 2021-05-18 NOTE — Progress Notes (Signed)
COMMUNITY PALLIATIVE CARE SW NOTE  PATIENT NAME: Adam Cordova DOB: [birth year] MRN: [id number]  PRIMARY CARE PROVIDER: [name], MD  RESPONSIBLE PARTY:  Acct ID - Guarantor Home Phone Work Phone Relationship Acct Type  [id number] Idelle [phone]  Self P/F     [address], [place], [place] [address]     PLAN OF CARE and INTERVENTIONS:             GOALS OF CARE/ ADVANCE CARE PLANNING:  Otilio Carpen is for patient to have successful respite. Patient is a DNR.  SOCIAL/EMOTIONAL/SPIRITUAL ASSESSMENT/ INTERVENTIONS:  SW completed a face-to-face visit with patient at [facility] where he was currently for a 30 day respite. Patient was sitting in his chair, leaning to the side sleeping. He aroused to verbal prompts. He responded yes/no to simple questions. He denied pain. He did not appear to be in any pain or discomfort. However, SW noticed that patient has few skin tears that was covered with bandages. Patient could not tell SW how he received the skin tears as he appeared confused and began stare off. SW consulted with med tech, Royden Purl, who advised that patient has had a 4 falls that have been unwittenessed and he sustained skin tears and one where he hit his head and had to be sent to emergency room. He is eating well and is sleeping most of the day. He is often up on his [place] ambulating the halls. SW reviewed patient's chart and encouraged the staff to call with any questions or concerns.   PATIENT/CAREGIVER EDUCATION/ COPING:  No coping issues presented. PERSONAL EMERGENCY PLAN:  Per facility protocol. COMMUNITY RESOURCES COORDINATION/ HEALTH CARE NAVIGATION:  None. FINANCIAL/LEGAL CONCERNS/INTERVENTIONS:  None.     SOCIAL HX:  Social History   Tobacco Use   Smoking status: Never   Smokeless tobacco: Never  Substance Use Topics   Alcohol use: Yes    Comment: 7-10 glasses of wine a week    CODE STATUS: DNR ADVANCED DIRECTIVES: Yes MOST FORM COMPLETE:   No HOSPICE EDUCATION PROVIDED: No  PPS:  Patient having difficulty ambulating and has had multiple falls. He remains at high risk for falls due to complications and progression of his parkinson's disease and increased generalized weakness.  He is sleeping more overall.  Duration of visit and documentation: 45 minutes.  [address] [place], [place]

## 2021-06-20 DIAGNOSIS — I10 Essential (primary) hypertension: Secondary | ICD-10-CM | POA: Diagnosis not present

## 2021-06-20 DIAGNOSIS — Z0000 Encounter for general adult medical examination without abnormal findings: Secondary | ICD-10-CM | POA: Diagnosis not present

## 2021-06-20 DIAGNOSIS — E7800 Pure hypercholesterolemia, unspecified: Secondary | ICD-10-CM | POA: Diagnosis not present

## 2021-06-20 DIAGNOSIS — N400 Enlarged prostate without lower urinary tract symptoms: Secondary | ICD-10-CM | POA: Diagnosis not present

## 2021-06-20 DIAGNOSIS — G20 Parkinson's disease: Secondary | ICD-10-CM | POA: Diagnosis not present

## 2021-06-22 ENCOUNTER — Telehealth: Payer: Self-pay

## 2021-06-25 ENCOUNTER — Other Ambulatory Visit: Payer: PPO | Admitting: *Deleted

## 2021-06-25 ENCOUNTER — Other Ambulatory Visit: Payer: Self-pay

## 2021-06-25 DIAGNOSIS — Z515 Encounter for palliative care: Secondary | ICD-10-CM

## 2021-06-25 NOTE — Telephone Encounter (Signed)
Error

## 2021-06-26 ENCOUNTER — Telehealth: Payer: Self-pay

## 2021-06-26 NOTE — Progress Notes (Signed)
[facility] RN NOTE  PATIENT NAME: Adam Cordova DOB: 1956/02/11 MRN: [id number]  PRIMARY CARE PROVIDER: [name], MD  RESPONSIBLE PARTY: [name] (wife) Acct ID - Guarantor Home Phone Work Phone Relationship Acct Type  [id number] Idelle [phone]  Self P/F     [address] [place], [place], [place] [address]   Due to the COVID-19 crisis, this virtual check-in visit was done via telephone from my office and it was initiated and consent by this patient and or family.  PLAN OF CARE and INTERVENTION:  ADVANCE CARE PLANNING/GOALS OF CARE: Goal is for patient to remain at home with his wife for as long as possible. He has a DNR. PATIENT/CAREGIVER EDUCATION: Hospice education, symptom management, safe mobility/transfers, fall prevention DISEASE STATUS: Virtual check-in visit completed via telephone. Wife states that she had a conversation with his Neurologist who feels that it is now time to transition patient to hospice care. He continues with increased overall confusion. He is also becoming progressively weaker and continues with frequent falls. He is sleeping more overall and leans constantly to the right when sitting due to truncal weakness. He returned home from a respite stay at [facility] last month. Palliative care visited with patient while at [facility] on 04/03/21 and 05/17/21. The main issue while there was ongoing falls. One fall resulting in an ED visit due to him hitting his head. He requires assistance with all ADLs. He is able to feed himself with weighted utensils but there are times he requires feeding due to weakness or tremors. His tremors are worsening. His appetite is fair, however she is noticing that he is coughing more during mealtimes and even throughout the night. [place] the beach [place] one last time. They are leaving Thursday 06/28/21 and returning 07/18/21. Wife wants to wait until she returns  from the beach for hospice assessment vs starting the process today. Will contact wife once she returns.   HISTORY OF PRESENT ILLNESS:   This is a 65 yo male with a diagnosis of Parkinson's disease. He has a h/o dementia, BPH, narcolepsy, gait disturbance and depression. Palliative care team continues to follow patient for additional support, goals of care and complex decision making.      CODE STATUS: DNR ADVANCED DIRECTIVES: Y MOST FORM: no PPS: 30%   (Duration of visit and documentation 20 minutes)   Daryl Eastern, RN BSN

## 2021-06-26 NOTE — Telephone Encounter (Signed)
Adam Cordova [phone] from Palliative care called to let us know that they have been working with the patients wife on getting [facility] term care for patient

## 2021-06-27 DIAGNOSIS — L821 Other seborrheic keratosis: Secondary | ICD-10-CM | POA: Diagnosis not present

## 2021-06-27 DIAGNOSIS — Z86008 Personal history of in-situ neoplasm of other site: Secondary | ICD-10-CM | POA: Diagnosis not present

## 2021-06-27 DIAGNOSIS — Z23 Encounter for immunization: Secondary | ICD-10-CM | POA: Diagnosis not present

## 2021-06-27 DIAGNOSIS — L814 Other melanin hyperpigmentation: Secondary | ICD-10-CM | POA: Diagnosis not present

## 2021-06-27 DIAGNOSIS — D225 Melanocytic nevi of trunk: Secondary | ICD-10-CM | POA: Diagnosis not present

## 2021-06-27 DIAGNOSIS — L578 Other skin changes due to chronic exposure to nonionizing radiation: Secondary | ICD-10-CM | POA: Diagnosis not present

## 2021-06-27 DIAGNOSIS — L219 Seborrheic dermatitis, unspecified: Secondary | ICD-10-CM | POA: Diagnosis not present

## 2021-07-27 ENCOUNTER — Emergency Department (HOSPITAL_COMMUNITY): Payer: PPO

## 2021-07-27 ENCOUNTER — Encounter (HOSPITAL_COMMUNITY): Payer: Self-pay | Admitting: Internal Medicine

## 2021-07-27 ENCOUNTER — Inpatient Hospital Stay (HOSPITAL_COMMUNITY)
Admission: EM | Admit: 2021-07-27 | Discharge: 2021-08-06 | DRG: 057 | Disposition: A | Discharge status: Skilled Nursing Facility | Payer: PPO | Attending: Internal Medicine | Admitting: Internal Medicine

## 2021-07-27 DIAGNOSIS — Z881 Allergy status to other antibiotic agents status: Secondary | ICD-10-CM

## 2021-07-27 DIAGNOSIS — Z91030 Bee allergy status: Secondary | ICD-10-CM | POA: Diagnosis not present

## 2021-07-27 DIAGNOSIS — N138 Other obstructive and reflux uropathy: Secondary | ICD-10-CM | POA: Diagnosis present

## 2021-07-27 DIAGNOSIS — Z888 Allergy status to other drugs, medicaments and biological substances status: Secondary | ICD-10-CM

## 2021-07-27 DIAGNOSIS — Z20822 Contact with and (suspected) exposure to covid-19: Secondary | ICD-10-CM | POA: Diagnosis present

## 2021-07-27 DIAGNOSIS — I4729 Other ventricular tachycardia: Secondary | ICD-10-CM | POA: Diagnosis present

## 2021-07-27 DIAGNOSIS — I472 Ventricular tachycardia, unspecified: Secondary | ICD-10-CM | POA: Diagnosis present

## 2021-07-27 DIAGNOSIS — R918 Other nonspecific abnormal finding of lung field: Secondary | ICD-10-CM | POA: Diagnosis not present

## 2021-07-27 DIAGNOSIS — R55 Syncope and collapse: Principal | ICD-10-CM | POA: Diagnosis present

## 2021-07-27 DIAGNOSIS — R4020 Unspecified coma: Secondary | ICD-10-CM | POA: Diagnosis not present

## 2021-07-27 DIAGNOSIS — Z79899 Other long term (current) drug therapy: Secondary | ICD-10-CM

## 2021-07-27 DIAGNOSIS — K5900 Constipation, unspecified: Secondary | ICD-10-CM | POA: Diagnosis not present

## 2021-07-27 DIAGNOSIS — Z66 Do not resuscitate: Secondary | ICD-10-CM | POA: Diagnosis not present

## 2021-07-27 DIAGNOSIS — R4182 Altered mental status, unspecified: Secondary | ICD-10-CM | POA: Diagnosis not present

## 2021-07-27 DIAGNOSIS — F0280 Dementia in other diseases classified elsewhere without behavioral disturbance: Secondary | ICD-10-CM | POA: Diagnosis not present

## 2021-07-27 DIAGNOSIS — G20 Parkinson's disease: Secondary | ICD-10-CM | POA: Diagnosis not present

## 2021-07-27 DIAGNOSIS — G20A1 Parkinson's disease without dyskinesia, without mention of fluctuations: Secondary | ICD-10-CM | POA: Diagnosis present

## 2021-07-27 DIAGNOSIS — G47419 Narcolepsy without cataplexy: Secondary | ICD-10-CM | POA: Diagnosis not present

## 2021-07-27 DIAGNOSIS — D72829 Elevated white blood cell count, unspecified: Secondary | ICD-10-CM | POA: Diagnosis not present

## 2021-07-27 DIAGNOSIS — R0902 Hypoxemia: Secondary | ICD-10-CM | POA: Diagnosis not present

## 2021-07-27 DIAGNOSIS — R404 Transient alteration of awareness: Secondary | ICD-10-CM | POA: Diagnosis not present

## 2021-07-27 DIAGNOSIS — Z8249 Family history of ischemic heart disease and other diseases of the circulatory system: Secondary | ICD-10-CM

## 2021-07-27 DIAGNOSIS — N401 Enlarged prostate with lower urinary tract symptoms: Secondary | ICD-10-CM | POA: Diagnosis not present

## 2021-07-27 DIAGNOSIS — I10 Essential (primary) hypertension: Secondary | ICD-10-CM | POA: Diagnosis not present

## 2021-07-27 DIAGNOSIS — Z7189 Other specified counseling: Secondary | ICD-10-CM | POA: Diagnosis not present

## 2021-07-27 DIAGNOSIS — G903 Multi-system degeneration of the autonomic nervous system: Secondary | ICD-10-CM | POA: Diagnosis present

## 2021-07-27 DIAGNOSIS — R269 Unspecified abnormalities of gait and mobility: Secondary | ICD-10-CM

## 2021-07-27 DIAGNOSIS — Z515 Encounter for palliative care: Secondary | ICD-10-CM | POA: Diagnosis not present

## 2021-07-27 HISTORY — DX: Systemic involvement of connective tissue, unspecified: M35.9

## 2021-07-27 LAB — URINALYSIS, ROUTINE W REFLEX MICROSCOPIC
Bilirubin Urine: NEGATIVE
Glucose, UA: NEGATIVE mg/dL
Hgb urine dipstick: NEGATIVE
Ketones, ur: 5 mg/dL — AB
Leukocytes,Ua: NEGATIVE
Nitrite: NEGATIVE
Protein, ur: NEGATIVE mg/dL
Specific Gravity, Urine: 1.023 (ref 1.005–1.030)
pH: 6 (ref 5.0–8.0)

## 2021-07-27 LAB — CBC WITH DIFFERENTIAL/PLATELET
Abs Immature Granulocytes: 0.03 10*3/uL (ref 0.00–0.07)
Basophils Absolute: 0 10*3/uL (ref 0.0–0.1)
Basophils Relative: 0 %
Eosinophils Absolute: 0.1 10*3/uL (ref 0.0–0.5)
Eosinophils Relative: 1 %
HCT: 41.4 % (ref 39.0–52.0)
Hemoglobin: 13.2 g/dL (ref 13.0–17.0)
Immature Granulocytes: 0 %
Lymphocytes Relative: 12 %
Lymphs Abs: 1.2 10*3/uL (ref 0.7–4.0)
MCH: 27.2 pg (ref 26.0–34.0)
MCHC: 31.9 g/dL (ref 30.0–36.0)
MCV: 85.4 fL (ref 80.0–100.0)
Monocytes Absolute: 0.9 10*3/uL (ref 0.1–1.0)
Monocytes Relative: 9 %
Neutro Abs: 8.1 10*3/uL — ABNORMAL HIGH (ref 1.7–7.7)
Neutrophils Relative %: 78 %
Platelets: 235 10*3/uL (ref 150–400)
RBC: 4.85 MIL/uL (ref 4.22–5.81)
RDW: 15.1 % (ref 11.5–15.5)
WBC: 10.5 10*3/uL (ref 4.0–10.5)
nRBC: 0 % (ref 0.0–0.2)

## 2021-07-27 LAB — BASIC METABOLIC PANEL
Anion gap: 7 (ref 5–15)
BUN: 17 mg/dL (ref 8–23)
CO2: 21 mmol/L — ABNORMAL LOW (ref 22–32)
Calcium: 8.3 mg/dL — ABNORMAL LOW (ref 8.9–10.3)
Chloride: 109 mmol/L (ref 98–111)
Creatinine, Ser: 0.78 mg/dL (ref 0.61–1.24)
GFR, Estimated: >60 mL/min (ref >60)
Glucose, Bld: 76 mg/dL (ref 70–99)
Potassium: 4.5 mmol/L (ref 3.5–5.1)
Sodium: 137 mmol/L (ref 135–145)

## 2021-07-27 LAB — TROPONIN I (HIGH SENSITIVITY)
Troponin I (High Sensitivity): 6 ng/L (ref <18)
Troponin I (High Sensitivity): 5 ng/L (ref <18)

## 2021-07-27 LAB — RESP PANEL BY RT-PCR (FLU A&B, COVID) ARPGX2
Influenza A by PCR: NEGATIVE
Influenza B by PCR: NEGATIVE
SARS Coronavirus 2 by RT PCR: NEGATIVE

## 2021-07-27 MED ORDER — MIRABEGRON ER 25 MG PO TB24
25.0000 mg | ORAL_TABLET | Freq: Every day | ORAL | Status: DC
  Administered 2021-07-28 – 2021-08-05 (×9): 25 mg via ORAL
  Filled 2021-07-27 (×10): qty 1

## 2021-07-27 MED ORDER — QUETIAPINE FUMARATE 25 MG PO TABS
50.0000 mg | ORAL_TABLET | Freq: Every day | ORAL | Status: DC
  Administered 2021-07-27 – 2021-08-05 (×10): 50 mg via ORAL
  Filled 2021-07-27 (×10): qty 2

## 2021-07-27 MED ORDER — INFLUENZA VAC A&B SA ADJ QUAD 0.5 ML IM PRSY
0.5000 mL | PREFILLED_SYRINGE | INTRAMUSCULAR | Status: DC
  Filled 2021-07-27: qty 0.5

## 2021-07-27 MED ORDER — CARBIDOPA-LEVODOPA ER 50-200 MG PO TBCR
2.5000 | EXTENDED_RELEASE_TABLET | Freq: Every day | ORAL | Status: DC
  Administered 2021-07-27: 2.5 via ORAL
  Filled 2021-07-27 (×3): qty 2.5

## 2021-07-27 MED ORDER — SODIUM CHLORIDE 0.9% FLUSH
3.0000 mL | Freq: Two times a day (BID) | INTRAVENOUS | Status: DC
  Administered 2021-07-27 – 2021-07-31 (×6): 3 mL via INTRAVENOUS

## 2021-07-27 MED ORDER — MELATONIN 5 MG PO TABS
5.0000 mg | ORAL_TABLET | Freq: Once | ORAL | Status: AC
  Administered 2021-07-28: 5 mg via ORAL
  Filled 2021-07-27: qty 1

## 2021-07-27 MED ORDER — VITAMIN B-12 1000 MCG PO TABS
1000.0000 ug | ORAL_TABLET | Freq: Every day | ORAL | Status: DC
  Administered 2021-07-28 – 2021-08-06 (×10): 1000 ug via ORAL
  Filled 2021-07-27 (×10): qty 1

## 2021-07-27 MED ORDER — SODIUM CHLORIDE 0.9 % IV BOLUS
1000.0000 mL | Freq: Once | INTRAVENOUS | Status: AC
  Administered 2021-07-27: 1000 mL via INTRAVENOUS

## 2021-07-27 MED ORDER — QUETIAPINE FUMARATE 25 MG PO TABS: 25.0000 mg | ORAL_TABLET | Freq: Two times a day (BID) | ORAL | Status: DC

## 2021-07-27 MED ORDER — DONEPEZIL HCL 10 MG PO TABS
10.0000 mg | ORAL_TABLET | Freq: Every day | ORAL | Status: DC
  Administered 2021-07-27 – 2021-08-05 (×10): 10 mg via ORAL
  Filled 2021-07-27 (×10): qty 1

## 2021-07-27 MED ORDER — CARBIDOPA-LEVODOPA 25-100 MG PO TABS
2.0000 | ORAL_TABLET | ORAL | Status: DC
  Administered 2021-07-28 – 2021-08-06 (×10): 2 via ORAL
  Filled 2021-07-27 (×10): qty 2

## 2021-07-27 MED ORDER — TAMSULOSIN HCL 0.4 MG PO CAPS
0.4000 mg | ORAL_CAPSULE | Freq: Two times a day (BID) | ORAL | Status: DC
  Administered 2021-07-27 – 2021-08-06 (×20): 0.4 mg via ORAL
  Filled 2021-07-27 (×24): qty 1

## 2021-07-27 MED ORDER — CARBIDOPA-LEVODOPA 25-100 MG PO TABS
2.5000 | ORAL_TABLET | ORAL | Status: DC
  Administered 2021-07-27 – 2021-08-06 (×20): 2.5 via ORAL
  Filled 2021-07-27 (×21): qty 2.5

## 2021-07-27 MED ORDER — CARBIDOPA-LEVODOPA 25-100 MG PO TABS
3.0000 | ORAL_TABLET | ORAL | Status: DC
  Administered 2021-07-28 – 2021-08-06 (×10): 3 via ORAL
  Filled 2021-07-27 (×10): qty 3

## 2021-07-27 MED ORDER — QUETIAPINE FUMARATE 25 MG PO TABS
25.0000 mg | ORAL_TABLET | Freq: Every day | ORAL | Status: DC
  Administered 2021-07-28 – 2021-08-06 (×10): 25 mg via ORAL
  Filled 2021-07-27 (×11): qty 1

## 2021-07-27 NOTE — ED Triage Notes (Addendum)
BIB EMS from home. EMS reports the following. This morning, pt walked into kitchen, sat down, and slumped in chair. Wife said she propped him up in the chair and he was unresponsive about 10 min. EMS returned to baseline before EMS arrived. EMS 12 lead NSR. Dementia at baseline. Baseline is oriented to self. Not oriented to time, situation or location. No recent falls, blood thinners, or changes in medications. No hx of syncope or unresponsiveness. Yellow DNR order at bedside. Spouse on the way to hospital.

## 2021-07-27 NOTE — ED Provider Notes (Signed)
Adam Cordova   CSN: [id number] Arrival date & time: 07/27/21  1026     History Chief Complaint  Patient presents with   Loss of Consciousness    Adam Cordova is a 65 y.o. male with a history of Parkinson's dementia, UTIs, presenting from home with an episode of unresponsiveness.  The patient is a poor historian due to his dementia, but supplemental history is provided by his wife at bedside.  She reports that the patient has been "more out of it than usual this week," with excessive sleeping and confusion.  They had family over for Thanksgiving yesterday.  Today the patient did attempt to get up and walk into the kitchen, which he says is unusual, as he is normally too weak to walk on his own.  He made it halfway across the kitchen and then "turned pale and slumped into me."  Family caught him and helped him to the floor.  His wife reports he was unresponsive for approximately 5 minutes, but then gradually came to.  There was no tonic-clonic jerking activity and no history of seizures.  She reports he has had a similar episode once in the past, but not since then.  She reports he has had a cough that seems worse than normal the past few days.  She states she will also been sleeping excessively for the past 2 days, which is unusual for him.  The patient himself denies any acute complaints to me, other than some mild lightheadedness.  He denies headache, chest pain, chest pressure.  He reports he did have a bowel movement today, no report of diarrhea or dysuria.  HPI     Past Medical History:  Diagnosis Date   BPH (benign prostatic hypertrophy) with urinary retention    Foley catheter in place    Parkinson disease [facility])    neurologist--  dr Marcello Moores hickey  in [place]   Parkinson's disease dementia [facility])    UTI (urinary tract infection)     Patient Active Problem List   Diagnosis Date Noted   Syncope 07/27/2021   Complex laceration of  ear 03/25/2019   Closed displaced fracture of shaft of fourth metacarpal bone of left hand 09/16/2018   Parkinson's disease ([place]) 10/17/2017   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[place]) 10/17/2017   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   BPH with obstruction/lower urinary tract symptoms 07/09/2017   Gait disturbance 09/18/2016   Poor balance 09/18/2016   Depression 06/11/2016   Excessive sleepiness 06/11/2016   Auditory hallucination 06/11/2016   Hallucination, visual 06/11/2016   Metatarsal fracture 03/27/2015   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   Acute urinary retention 11/23/2012   BPH (benign prostatic hyperplasia) 11/23/2012   Enlarged prostate without lower urinary tract symptoms (luts) 11/23/2012   Parkinson disease ([place]) 05/18/2012    Past Surgical History:  Procedure Laterality Date   TRANSURETHRAL RESECTION OF PROSTATE N/A 11/23/2012   Procedure: TRANSURETHRAL RESECTION OF THE PROSTATE WITH GYRUS INSTRUMENTS;  Surgeon: Bernestine Amass, MD;  Location: [facility];  Service: Urology;  Laterality: N/A;   TRANSURETHRAL RESECTION OF PROSTATE N/A 07/09/2017   Procedure: TRANSURETHRAL RESECTION OF THE PROSTATE (TURP), BIPOLAR;  Surgeon: Ceasar Mons, MD;  Location: [facility];  Service: Urology;  Laterality: N/A;       Family History  Problem Relation Age of Onset   Dementia Mother    Neuropathy Mother    Heart disease Father  Heart disease Brother     Social History   Tobacco Use   Smoking status: Never   Smokeless tobacco: Never  Vaping Use   Vaping Use: Never used  Substance Use Topics   Alcohol use: Yes    Comment: 7-10 glasses of wine a week   Drug use: No    Home Medications Prior to Admission medications   Medication Sig Start Date End Date Taking? Authorizing Provider  carbidopa-levodopa (SINEMET CR) 50-200 MG tablet Take 1 tablet by mouth at bedtime. Patient taking  differently: Take 2.5 tablets by mouth at bedtime. Scheduled for 9pm each night 02/27/21  Yes Tat, Eustace Quail, DO  carbidopa-levodopa (SINEMET IR) 25-100 MG tablet 3 in the AM, 2 at 10am, 2 at 2pm, 2 at 6pm Patient taking differently: Take 0.5 tablets by mouth in the morning, at noon, in the evening, and at bedtime. 3 tablets at 7am, 2 tablets at 10am, 2.5 tablets at 2pm, and 2.5 tablets at 6pm. 02/05/21  Yes Tat, Rebecca S, DO  donepezil (ARICEPT) 10 MG tablet Take 1 tablet (10 mg total) by mouth at bedtime. 06/22/20  Yes Tat, Eustace Quail, DO  EPINEPHrine 0.3 mg/0.3 mL IJ SOAJ injection Inject 0.3 mg into the muscle as needed for anaphylaxis.   Yes [provider]  MYRBETRIQ 25 MG TB24 tablet Take 25 mg by mouth daily. 12/24/19  Yes [provider]  QUEtiapine (SEROQUEL) 25 MG tablet 1 in the AM, 2 at bed for a week and then 2 po bid as directed Patient taking differently: Take 25-50 mg by mouth 2 (two) times daily. 25mg  qam, and 50mg  qhs 02/27/21  Yes Tat, Eustace Quail, DO  Tamsulosin HCl (FLOMAX) 0.4 MG CAPS Take 0.4 mg by mouth 2 (two) times daily.    Yes [provider]  vitamin B-12 (CYANOCOBALAMIN) 1000 MCG tablet Take 1,000 mcg by mouth daily.   Yes [provider]    Allergies    Bee venom, Levaquin [levofloxacin hemihydrate], and Sertraline  Review of Systems   Review of Systems  Constitutional:  Negative for chills and fever.  Respiratory:  Negative for cough and shortness of breath.   Cardiovascular:  Negative for chest pain and palpitations.  Gastrointestinal:  Negative for abdominal pain, nausea and vomiting.  Musculoskeletal:  Negative for arthralgias and myalgias.  Skin:  Negative for color change and rash.  Neurological:  Positive for light-headedness. Negative for syncope and headaches.  All other systems reviewed and are negative.  Physical Exam Updated Vital Signs BP (!) 132/91   Pulse 69   Temp 97.6 F (36.4 C) (Oral)   Resp 17   SpO2 93%    Physical Exam Constitutional:      General: He is not in acute distress. HENT:     Head: Normocephalic and atraumatic.  Eyes:     Conjunctiva/sclera: Conjunctivae normal.     Pupils: Pupils are equal, round, and reactive to light.  Cardiovascular:     Rate and Rhythm: Normal rate and regular rhythm.  Pulmonary:     Effort: Pulmonary effort is normal. No respiratory distress.  Abdominal:     General: There is no distension.     Tenderness: There is no abdominal tenderness.  Skin:    General: Skin is warm and dry.  Neurological:     General: No focal deficit present.     Mental Status: He is alert. Mental status is at baseline.     Cranial Nerves: No cranial nerve deficit.  Sensory: No sensory deficit.    ED Results / Procedures / Treatments   Labs (all labs ordered are listed, but only abnormal results are displayed) Labs Reviewed  BASIC METABOLIC PANEL - Abnormal; Notable for the following components:      Result Value   CO2 21 (*)    Calcium 8.3 (*)    All other components within normal limits  CBC WITH DIFFERENTIAL/PLATELET - Abnormal; Notable for the following components:   Neutro Abs 8.1 (*)    All other components within normal limits  URINALYSIS, ROUTINE W REFLEX MICROSCOPIC - Abnormal; Notable for the following components:   Ketones, ur 5 (*)    All other components within normal limits  RESP PANEL BY RT-PCR (FLU A&B, COVID) [id number]  TROPONIN I (HIGH SENSITIVITY)  TROPONIN I (HIGH SENSITIVITY)    EKG EKG Interpretation  Date/Time:  Friday July 27 2021 10:38:41 EST Ventricular Rate:  67 PR Interval:  189 QRS Duration: 100 QT Interval:  431 QTC Calculation: 455 R Axis:   9 Text Interpretation: Sinus rhythm Confirmed by Octaviano Glow [id number]) on 07/27/2021 10:56:09 AM  Radiology CT Head Wo Contrast  Result Date: 07/27/2021 CLINICAL DATA:  Altered mental status EXAM: CT HEAD WITHOUT CONTRAST TECHNIQUE: Contiguous axial images were obtained  from the base of the skull through the vertex without intravenous contrast. COMPARISON:  05/15/2021 FINDINGS: Brain: There is atrophy and chronic small vessel disease changes. No acute intracranial abnormality. Specifically, no hemorrhage, hydrocephalus, mass lesion, acute infarction, or significant intracranial injury. Vascular: No hyperdense vessel or unexpected calcification. Skull: No acute calvarial abnormality. Sinuses/Orbits: No acute findings Other: None IMPRESSION: Atrophy, chronic microvascular disease. No acute intracranial abnormality. Electronically Signed   By: Rolm Baptise M.D.   On: 07/27/2021 11:42   DG Chest Portable 1 View  Result Date: 07/27/2021 CLINICAL DATA:  Evaluate for pneumonia EXAM: PORTABLE CHEST 1 VIEW COMPARISON:  July 29, 2020 FINDINGS: Right costophrenic angles excluded from the field of view. The heart size and mediastinal contours are within normal limits. Bibasilar linear opacities. Lungs otherwise clear. The visualized skeletal structures are unremarkable. IMPRESSION: Bibasilar linear opacities, likely due to scarring or atelectasis. Electronically Signed   By: Yetta Glassman M.D.   On: 07/27/2021 12:36    Procedures Procedures   Medications Ordered in ED Medications  sodium chloride 0.9 % bolus 1,000 mL (0 mLs Intravenous Stopped 07/27/21 1233)    ED Course  I have reviewed the triage vital signs and the nursing notes.  Pertinent labs & imaging results that were available during my care of the patient were reviewed by me and considered in my medical decision making (see chart for details).  This patient presents with syncopal episode. This involves an extensive number of treatment options, and is a complaint that carries with it a high risk of complications and morbidity.  The differential diagnosis includes orthostatic hypotension vs infection vs anemia vs other  I ordered, reviewed, and interpreted labs.  No life-threatening abnormalities were noted  on these tests. I ordered medication IV fluids for hydration I ordered imaging studies which included CTH, DG chest I independently visualized and interpreted imaging which showed no CVA or acute intrathoracic process, and the monitor tracing which showed NSR Additional history was obtained from wife at bedside  Patient EKG per my interpretation shows normal sinus rhythm with no acute ischemic findings and no evidence of arrhythmia.  His wife denies that is any history of cardiac issues.  I have a lower suspicion  for arrhythmia such as heart block or acute coronary syndrome.    Clinical Course as of 07/27/21 1611  Fri Jul 27, 2021  1146 IMPRESSION: Atrophy, chronic microvascular disease.  No acute intracranial abnormality. [MT]  1259 Troponin I (High Sensitivity): 6 [MT]  1543 Patient mains asymptomatic.  Work-up is largely unremarkable.  We did discuss observation stay in the hospital with possible echocardiogram for syncope versus outpatient follow-up, and the wife and the patient would prefer to have this done in the hospital at this time.  I will page for admission [MT]    Clinical Course User Index [MT] Caprisha Bridgett, Carola Rhine, MD   Final Clinical Impression(s) / ED Diagnoses Final diagnoses:  Syncope, unspecified syncope type    Rx / DC Orders ED Discharge Orders     None        Wyvonnia Dusky, MD 07/27/21 [phone]

## 2021-07-27 NOTE — H&P (Signed)
History and Physical    Adam Cordova OTL:[id number] DOB: Sep 26, 1955 DOA: 07/27/2021  PCP: [name], MD  Patient coming from: Home  Chief Complaint: "passed out"  HPI: Adam Cordova is a 65 y.o. male with medical history significant of Parkinson's disease, dementia, narcolepsy, BPH. Presenting with syncopal episode. History from wife at bedside. He was on his couch this morning. He got up and moved to the kitchen. As he entered the kitchen, he collapsed onto his wife. She was able to catch him and rest him on a stool. He laid his head on her shoulder and "was out" for at least 5 minutes. There was no tonic-clonic movement, tongue swallowing, foaming at the mouth, or incontinence. When he came to, he was a little groggy. EMS was called.   His wife reports that last night he had a couple of runs of severe full body tremors. He's never had these events before. She denies any other aggravating or alleviating factors.  ED Course: CTH was negative. Lab work was otherwise normal. TRH was called for syncope admission.   Review of Systems:  Review of systems is otherwise negative for all not mentioned in HPI.   PMHx Past Medical History:  Diagnosis Date   BPH (benign prostatic hypertrophy) with urinary retention    Foley catheter in place    Parkinson disease [facility])    neurologist--  dr Richardean Chimera  in [place]   Parkinson's disease dementia [facility])    UTI (urinary tract infection)     PSHx Past Surgical History:  Procedure Laterality Date   TRANSURETHRAL RESECTION OF PROSTATE N/A 11/23/2012   Procedure: TRANSURETHRAL RESECTION OF THE PROSTATE WITH GYRUS INSTRUMENTS;  Surgeon: Bernestine Amass, MD;  Location: [facility];  Service: Urology;  Laterality: N/A;   TRANSURETHRAL RESECTION OF PROSTATE N/A 07/09/2017   Procedure: TRANSURETHRAL RESECTION OF THE PROSTATE (TURP), BIPOLAR;  Surgeon: Ceasar Mons, MD;  Location: [facility];  Service: Urology;  Laterality: N/A;     SocHx  reports that he has never smoked. He has never used smokeless tobacco. He reports current alcohol use. He reports that he does not use drugs.  Allergies  Allergen Reactions   Bee Venom Anaphylaxis   Levaquin [Levofloxacin Hemihydrate] Anaphylaxis and Swelling   Sertraline Diarrhea and Nausea Only    FamHx Family History  Problem Relation Age of Onset   Dementia Mother    Neuropathy Mother    Heart disease Father    Heart disease Brother     Prior to Admission medications   Medication Sig Start Date End Date Taking? Authorizing Provider  carbidopa-levodopa (SINEMET CR) 50-200 MG tablet Take 1 tablet by mouth at bedtime. Patient taking differently: Take 2.5 tablets by mouth at bedtime. Scheduled for 9pm each night 02/27/21  Yes Tat, Eustace Quail, DO  carbidopa-levodopa (SINEMET IR) 25-100 MG tablet 3 in the AM, 2 at 10am, 2 at 2pm, 2 at 6pm Patient taking differently: Take 0.5 tablets by mouth in the morning, at noon, in the evening, and at bedtime. 3 tablets at 7am, 2 tablets at 10am, 2.5 tablets at 2pm, and 2.5 tablets at 6pm. 02/05/21  Yes Tat, Rebecca S, DO  donepezil (ARICEPT) 10 MG tablet Take 1 tablet (10 mg total) by mouth at bedtime. 06/22/20  Yes Tat, Eustace Quail, DO  EPINEPHrine 0.3 mg/0.3 mL IJ SOAJ injection Inject 0.3 mg into the muscle as needed for anaphylaxis.   Yes [provider]  MYRBETRIQ 25 MG [phone]  tablet Take 25 mg by mouth daily. 12/24/19  Yes [provider]  QUEtiapine (SEROQUEL) 25 MG tablet 1 in the AM, 2 at bed for a week and then 2 po bid as directed Patient taking differently: Take 25-50 mg by mouth 2 (two) times daily. 25mg  qam, and 50mg  qhs 02/27/21  Yes Tat, Eustace Quail, DO  Tamsulosin HCl (FLOMAX) 0.4 MG CAPS Take 0.4 mg by mouth 2 (two) times daily.    Yes [provider]  vitamin B-12 (CYANOCOBALAMIN) 1000 MCG tablet Take 1,000 mcg by mouth daily.   Yes [provider]    Physical Exam: Vitals:   07/27/21 1300  07/27/21 1330 07/27/21 1435 07/27/21 1500  BP: 124/85 128/84 (!) 145/99 (!) 132/91  Pulse: 63 60 63 69  Resp: 12 20 16 17   Temp:      TempSrc:      SpO2: 96% 94% 94% 93%    General: 65 y.o. male resting in bed in NAD Eyes: PERRL, normal sclera ENMT: Nares patent w/o discharge, orophaynx clear, dentition normal, ears w/o discharge/lesions/ulcers Neck: Supple, trachea midline Cardiovascular: RRR, +S1, S2, no m/g/r, equal pulses throughout Respiratory: CTABL, no w/r/r, normal WOB GI: BS+, NDNT, no masses noted, no organomegaly noted MSK: No e/c/c; stiffness and rigidity of all extremities Neuro: A&O x 3, no focal deficits; generalized BLE/BUE weakness Psyc: Appropriate interaction and affect, calm/cooperative  Labs on Admission: I have personally reviewed following labs and imaging studies  CBC: Recent Labs  Lab 07/27/21 1108  WBC 10.5  NEUTROABS 8.1*  HGB 13.2  HCT 41.4  MCV 85.4  PLT 782   Basic Metabolic Panel: Recent Labs  Lab 07/27/21 1108  NA 137  K 4.5  CL 109  CO2 21*  GLUCOSE 76  BUN 17  CREATININE 0.78  CALCIUM 8.3*   GFR: CrCl cannot be calculated (Unknown ideal weight.). Liver Function Tests: No results for input(s): AST, ALT, ALKPHOS, BILITOT, PROT, ALBUMIN in the last 168 hours. No results for input(s): LIPASE, AMYLASE in the last 168 hours. No results for input(s): AMMONIA in the last 168 hours. Coagulation Profile: No results for input(s): INR, PROTIME in the last 168 hours. Cardiac Enzymes: No results for input(s): CKTOTAL, CKMB, CKMBINDEX, TROPONINI in the last 168 hours. BNP (last 3 results) No results for input(s): PROBNP in the last 8760 hours. HbA1C: No results for input(s): HGBA1C in the last 72 hours. CBG: No results for input(s): GLUCAP in the last 168 hours. Lipid Profile: No results for input(s): CHOL, HDL, LDLCALC, TRIG, CHOLHDL, LDLDIRECT in the last 72 hours. Thyroid Function Tests: No results for input(s): TSH, T4TOTAL,  FREET4, T3FREE, THYROIDAB in the last 72 hours. Anemia Panel: No results for input(s): VITAMINB12, FOLATE, FERRITIN, TIBC, IRON, RETICCTPCT in the last 72 hours. Urine analysis:    Component Value Date/Time   COLORURINE YELLOW 07/27/2021 1529   APPEARANCEUR CLEAR 07/27/2021 1529   APPEARANCEUR Clear 12/16/2018 1029   LABSPEC 1.023 07/27/2021 1529   PHURINE 6.0 07/27/2021 1529   GLUCOSEU NEGATIVE 07/27/2021 1529   HGBUR NEGATIVE 07/27/2021 1529   BILIRUBINUR NEGATIVE 07/27/2021 1529   BILIRUBINUR Negative 12/16/2018 1029   KETONESUR 5 (A) 07/27/2021 1529   PROTEINUR NEGATIVE 07/27/2021 1529   UROBILINOGEN 0.2 09/18/2011 1943   NITRITE NEGATIVE 07/27/2021 1529   LEUKOCYTESUR NEGATIVE 07/27/2021 1529    Radiological Exams on Admission: CT Head Wo Contrast  Result Date: 07/27/2021 CLINICAL DATA:  Altered mental status EXAM: CT HEAD WITHOUT CONTRAST TECHNIQUE: Contiguous axial images  were obtained from the base of the skull through the vertex without intravenous contrast. COMPARISON:  05/15/2021 FINDINGS: Brain: There is atrophy and chronic small vessel disease changes. No acute intracranial abnormality. Specifically, no hemorrhage, hydrocephalus, mass lesion, acute infarction, or significant intracranial injury. Vascular: No hyperdense vessel or unexpected calcification. Skull: No acute calvarial abnormality. Sinuses/Orbits: No acute findings Other: None IMPRESSION: Atrophy, chronic microvascular disease. No acute intracranial abnormality. Electronically Signed   By: Rolm Baptise M.D.   On: 07/27/2021 11:42   DG Chest Portable 1 View  Result Date: 07/27/2021 CLINICAL DATA:  Evaluate for pneumonia EXAM: PORTABLE CHEST 1 VIEW COMPARISON:  July 29, 2020 FINDINGS: Right costophrenic angles excluded from the field of view. The heart size and mediastinal contours are within normal limits. Bibasilar linear opacities. Lungs otherwise clear. The visualized skeletal structures are unremarkable.  IMPRESSION: Bibasilar linear opacities, likely due to scarring or atelectasis. Electronically Signed   By: Yetta Glassman M.D.   On: 07/27/2021 12:36    EKG: Independently reviewed. Sinus, no st elevations  Assessment/Plan Syncope     - place in obs, tele     - check EEG, echo, orthostatics     - fluids     - CTH is negative     - UA is negative  Hx of Parkinson's Parkinson's dementia     - continue home regimen  BPH     - continue home regimen  DVT prophylaxis: SCDs  Code Status: DNR  Family Communication: w/ wife at bedside  Consults called: None   Status is: Observation  The patient remains OBS appropriate and will d/c before 2 midnights.   Jonnie Finner DO [facility]  If 7PM-7AM, please contact night-coverage [email or web address]  07/27/2021, 3:55 PM

## 2021-07-27 NOTE — Progress Notes (Signed)
Patient confused and paranoid. After multiple attempts reorienting and re-assuring safety patient took his medication. Continues to refuse condom catheter.  Erling Conte, RN

## 2021-07-28 ENCOUNTER — Other Ambulatory Visit: Payer: Self-pay

## 2021-07-28 ENCOUNTER — Observation Stay (HOSPITAL_COMMUNITY): Payer: PPO

## 2021-07-28 DIAGNOSIS — G909 Disorder of the autonomic nervous system, unspecified: Secondary | ICD-10-CM | POA: Diagnosis not present

## 2021-07-28 DIAGNOSIS — G47419 Narcolepsy without cataplexy: Secondary | ICD-10-CM | POA: Diagnosis not present

## 2021-07-28 DIAGNOSIS — I4729 Other ventricular tachycardia: Secondary | ICD-10-CM | POA: Diagnosis not present

## 2021-07-28 DIAGNOSIS — R278 Other lack of coordination: Secondary | ICD-10-CM | POA: Diagnosis not present

## 2021-07-28 DIAGNOSIS — I10 Essential (primary) hypertension: Secondary | ICD-10-CM | POA: Diagnosis not present

## 2021-07-28 DIAGNOSIS — K5900 Constipation, unspecified: Secondary | ICD-10-CM | POA: Diagnosis not present

## 2021-07-28 DIAGNOSIS — R55 Syncope and collapse: Secondary | ICD-10-CM | POA: Diagnosis not present

## 2021-07-28 DIAGNOSIS — R1310 Dysphagia, unspecified: Secondary | ICD-10-CM | POA: Diagnosis not present

## 2021-07-28 DIAGNOSIS — Z8249 Family history of ischemic heart disease and other diseases of the circulatory system: Secondary | ICD-10-CM | POA: Diagnosis not present

## 2021-07-28 DIAGNOSIS — R410 Disorientation, unspecified: Secondary | ICD-10-CM | POA: Diagnosis not present

## 2021-07-28 DIAGNOSIS — E46 Unspecified protein-calorie malnutrition: Secondary | ICD-10-CM | POA: Diagnosis not present

## 2021-07-28 DIAGNOSIS — R41841 Cognitive communication deficit: Secondary | ICD-10-CM | POA: Diagnosis not present

## 2021-07-28 DIAGNOSIS — D72829 Elevated white blood cell count, unspecified: Secondary | ICD-10-CM | POA: Diagnosis not present

## 2021-07-28 DIAGNOSIS — R29898 Other symptoms and signs involving the musculoskeletal system: Secondary | ICD-10-CM | POA: Diagnosis not present

## 2021-07-28 DIAGNOSIS — Z20822 Contact with and (suspected) exposure to covid-19: Secondary | ICD-10-CM | POA: Diagnosis present

## 2021-07-28 DIAGNOSIS — I472 Ventricular tachycardia, unspecified: Secondary | ICD-10-CM | POA: Diagnosis present

## 2021-07-28 DIAGNOSIS — Z881 Allergy status to other antibiotic agents status: Secondary | ICD-10-CM | POA: Diagnosis not present

## 2021-07-28 DIAGNOSIS — Z7401 Bed confinement status: Secondary | ICD-10-CM | POA: Diagnosis not present

## 2021-07-28 DIAGNOSIS — Z7189 Other specified counseling: Secondary | ICD-10-CM | POA: Diagnosis not present

## 2021-07-28 DIAGNOSIS — Z91030 Bee allergy status: Secondary | ICD-10-CM | POA: Diagnosis not present

## 2021-07-28 DIAGNOSIS — Z515 Encounter for palliative care: Secondary | ICD-10-CM | POA: Diagnosis not present

## 2021-07-28 DIAGNOSIS — N138 Other obstructive and reflux uropathy: Secondary | ICD-10-CM | POA: Diagnosis not present

## 2021-07-28 DIAGNOSIS — Z66 Do not resuscitate: Secondary | ICD-10-CM | POA: Diagnosis present

## 2021-07-28 DIAGNOSIS — F03A18 Unspecified dementia, mild, with other behavioral disturbance: Secondary | ICD-10-CM | POA: Diagnosis not present

## 2021-07-28 DIAGNOSIS — M6281 Muscle weakness (generalized): Secondary | ICD-10-CM | POA: Diagnosis not present

## 2021-07-28 DIAGNOSIS — R262 Difficulty in walking, not elsewhere classified: Secondary | ICD-10-CM | POA: Diagnosis not present

## 2021-07-28 DIAGNOSIS — Z79899 Other long term (current) drug therapy: Secondary | ICD-10-CM | POA: Diagnosis not present

## 2021-07-28 DIAGNOSIS — G20 Parkinson's disease: Secondary | ICD-10-CM | POA: Diagnosis not present

## 2021-07-28 DIAGNOSIS — F0280 Dementia in other diseases classified elsewhere without behavioral disturbance: Secondary | ICD-10-CM | POA: Diagnosis not present

## 2021-07-28 DIAGNOSIS — N401 Enlarged prostate with lower urinary tract symptoms: Secondary | ICD-10-CM | POA: Diagnosis not present

## 2021-07-28 DIAGNOSIS — G903 Multi-system degeneration of the autonomic nervous system: Secondary | ICD-10-CM | POA: Diagnosis present

## 2021-07-28 DIAGNOSIS — M2550 Pain in unspecified joint: Secondary | ICD-10-CM | POA: Diagnosis not present

## 2021-07-28 DIAGNOSIS — Z888 Allergy status to other drugs, medicaments and biological substances status: Secondary | ICD-10-CM | POA: Diagnosis not present

## 2021-07-28 DIAGNOSIS — J9811 Atelectasis: Secondary | ICD-10-CM | POA: Diagnosis not present

## 2021-07-28 DIAGNOSIS — R269 Unspecified abnormalities of gait and mobility: Secondary | ICD-10-CM | POA: Diagnosis not present

## 2021-07-28 LAB — COMPREHENSIVE METABOLIC PANEL
ALT: <5 U/L (ref 0–44)
AST: 11 U/L — ABNORMAL LOW (ref 15–41)
Albumin: 4.1 g/dL (ref 3.5–5.0)
Alkaline Phosphatase: 72 U/L (ref 38–126)
Anion gap: 8 (ref 5–15)
BUN: 17 mg/dL (ref 8–23)
CO2: 23 mmol/L (ref 22–32)
Calcium: 8.8 mg/dL — ABNORMAL LOW (ref 8.9–10.3)
Chloride: 109 mmol/L (ref 98–111)
Creatinine, Ser: 0.72 mg/dL (ref 0.61–1.24)
GFR, Estimated: >60 mL/min (ref >60)
Glucose, Bld: 87 mg/dL (ref 70–99)
Potassium: 3.8 mmol/L (ref 3.5–5.1)
Sodium: 140 mmol/L (ref 135–145)
Total Bilirubin: 1.5 mg/dL — ABNORMAL HIGH (ref 0.3–1.2)
Total Protein: 6.9 g/dL (ref 6.5–8.1)

## 2021-07-28 LAB — CBC
HCT: 44 % (ref 39.0–52.0)
Hemoglobin: 13.9 g/dL (ref 13.0–17.0)
MCH: 26.7 pg (ref 26.0–34.0)
MCHC: 31.6 g/dL (ref 30.0–36.0)
MCV: 84.5 fL (ref 80.0–100.0)
Platelets: 273 10*3/uL (ref 150–400)
RBC: 5.21 MIL/uL (ref 4.22–5.81)
RDW: 14.9 % (ref 11.5–15.5)
WBC: 14.6 10*3/uL — ABNORMAL HIGH (ref 4.0–10.5)
nRBC: 0 % (ref 0.0–0.2)

## 2021-07-28 LAB — ECHOCARDIOGRAM COMPLETE
AR max vel: 2.29 cm2
AV Area VTI: 2.43 cm2
AV Area mean vel: 2.39 cm2
AV Mean grad: 3 mmHg
AV Peak grad: 5.7 mmHg
Ao pk vel: 1.19 m/s
Area-P 1/2: 4.39 cm2
Calc EF: 55 %
Height: 70 in
S' Lateral: 3.2 cm
Single Plane A2C EF: 55 %
Single Plane A4C EF: 54.5 %
Weight: 2673.74 oz

## 2021-07-28 LAB — HIV ANTIBODY (ROUTINE TESTING W REFLEX): HIV Screen 4th Generation wRfx: NONREACTIVE

## 2021-07-28 LAB — GLUCOSE, CAPILLARY: Glucose-Capillary: 86 mg/dL (ref 70–99)

## 2021-07-28 LAB — MAGNESIUM: Magnesium: 2.2 mg/dL (ref 1.7–2.4)

## 2021-07-28 MED ORDER — CARBIDOPA-LEVODOPA ER 50-200 MG PO TBCR
1.0000 | EXTENDED_RELEASE_TABLET | Freq: Every day | ORAL | Status: DC
Start: 2021-07-28 — End: 2021-08-06
  Administered 2021-07-28 – 2021-08-05 (×9): 1 via ORAL
  Filled 2021-07-28 (×10): qty 1

## 2021-07-28 MED ORDER — ENOXAPARIN SODIUM 40 MG/0.4ML IJ SOSY
40.0000 mg | PREFILLED_SYRINGE | Freq: Every day | INTRAMUSCULAR | Status: DC
  Administered 2021-07-28 – 2021-08-06 (×10): 40 mg via SUBCUTANEOUS
  Filled 2021-07-28 (×10): qty 0.4

## 2021-07-28 NOTE — Progress Notes (Addendum)
PROGRESS NOTE    Adam Cordova  KNL:[id number] DOB: 1956/01/24 DOA: 07/27/2021 PCP: [name], MD  Outpatient Specialists: neurology    Brief Narrative:   From admission h and p Adam Cordova is a 65 y.o. male with medical history significant of Parkinson's disease, dementia, narcolepsy, BPH. Presenting with syncopal episode. History from wife at bedside. He was on his couch this morning. He got up and moved to the kitchen. As he entered the kitchen, he collapsed onto his wife. She was able to catch him and rest him on a stool. He laid his head on her shoulder and "was out" for at least 5 minutes. There was no tonic-clonic movement, tongue swallowing, foaming at the mouth, or incontinence. When he came to, he was a little groggy. EMS was called.    His wife reports that last night he had a couple of runs of severe full body tremors. He's never had these events before. She denies any other aggravating or alleviating factors.  Assessment & Plan:   Principal Problem:   Syncope  # Syncopal episode Witnessed syncopal episode yesterday. Caught by wife, no trauma. CTH neg. Tele shows nonsustained v tach. Autonomic dysfunction also high in ddx. Flomax and anti-parkinson's agents and aricept likely also contributing. - see below  # Nonsustained ventricular tachycardia On tele overnight. Syncope could be symptom of such - f/u K and Mg - f/u TTE - cardiology consulted - maintain on tele  # Parkinson's disease Followed outpt by neurology - cont sinemet, aricept, seroquel - wife doesn't think she can safely care for patient at home. Will order PT consult and TOC consult for SNF placement  # BPH On myrbetriq and flomax. Wife says will retain urine if flomax stopped - continue flomax for now, aware likely contributes to orthostasis   DVT prophylaxis: lovenox Code Status: dnr Family Communication: wife updated telephonically 11/26  Level of care: Telemetry Status is:  Observation  Will change to inpt if needs to stay tonight for ongoing w/u       Consultants:  cardiology  Procedures: none  Antimicrobials:  none    Subjective: Feels well. No recurrence of syncope. No palpitations  Objective: Vitals:   07/27/21 2222 07/27/21 2300 07/28/21 0000 07/28/21 0611  BP: 105/87   105/75  Pulse: 86   69  Resp: (!) 22 (!) 23 (!) 21 16  Temp: 97.6 F (36.4 C)   97.9 F (36.6 C)  TempSrc: Oral   Oral  SpO2: 94%   94%  Weight:    75.8 kg  Height:        Intake/Output Summary (Last 24 hours) at 07/28/2021 0921 Last data filed at 07/27/2021 1900 Gross per 24 hour  Intake 240 ml  Output --  Net 240 ml   Filed Weights   07/27/21 1811 07/28/21 0611  Weight: 75.7 kg 75.8 kg    Examination:  General exam: Appears calm and comfortable.  Respiratory system: Clear to auscultation. Respiratory effort normal. Cardiovascular system: S1 & S2 heard, RRR. No JVD, murmurs, rubs, gallops or clicks. No pedal edema. Gastrointestinal system: Abdomen is nondistended, soft and nontender. No organomegaly or masses felt. Normal bowel sounds heard. Central nervous system: Alert and oriented. High amplitude tremor at rest Extremities: Symmetric 5 x 5 power. Skin: No rashes, lesions or ulcers Psychiatry: calm    Data Reviewed: I have personally reviewed following labs and imaging studies  CBC: Recent Labs  Lab 07/27/21 1108  WBC 10.5  NEUTROABS 8.1*  HGB 13.2  HCT 41.4  MCV 85.4  PLT 580   Basic Metabolic Panel: Recent Labs  Lab 07/27/21 1108  NA 137  K 4.5  CL 109  CO2 21*  GLUCOSE 76  BUN 17  CREATININE 0.78  CALCIUM 8.3*   GFR: Estimated Creatinine Clearance: 92.5 mL/min (by C-G formula based on SCr of 0.78 mg/dL). Liver Function Tests: No results for input(s): AST, ALT, ALKPHOS, BILITOT, PROT, ALBUMIN in the last 168 hours. No results for input(s): LIPASE, AMYLASE in the last 168 hours. No results for input(s): AMMONIA in the  last 168 hours. Coagulation Profile: No results for input(s): INR, PROTIME in the last 168 hours. Cardiac Enzymes: No results for input(s): CKTOTAL, CKMB, CKMBINDEX, TROPONINI in the last 168 hours. BNP (last 3 results) No results for input(s): PROBNP in the last 8760 hours. HbA1C: No results for input(s): HGBA1C in the last 72 hours. CBG: Recent Labs  Lab 07/28/21 0526  GLUCAP 86   Lipid Profile: No results for input(s): CHOL, HDL, LDLCALC, TRIG, CHOLHDL, LDLDIRECT in the last 72 hours. Thyroid Function Tests: No results for input(s): TSH, T4TOTAL, FREET4, T3FREE, THYROIDAB in the last 72 hours. Anemia Panel: No results for input(s): VITAMINB12, FOLATE, FERRITIN, TIBC, IRON, RETICCTPCT in the last 72 hours. Urine analysis:    Component Value Date/Time   COLORURINE YELLOW 07/27/2021 1529   APPEARANCEUR CLEAR 07/27/2021 1529   APPEARANCEUR Clear 12/16/2018 1029   LABSPEC 1.023 07/27/2021 1529   PHURINE 6.0 07/27/2021 1529   GLUCOSEU NEGATIVE 07/27/2021 1529   HGBUR NEGATIVE 07/27/2021 1529   BILIRUBINUR NEGATIVE 07/27/2021 1529   BILIRUBINUR Negative 12/16/2018 1029   KETONESUR 5 (A) 07/27/2021 1529   PROTEINUR NEGATIVE 07/27/2021 1529   UROBILINOGEN 0.2 09/18/2011 1943   NITRITE NEGATIVE 07/27/2021 1529   LEUKOCYTESUR NEGATIVE 07/27/2021 1529   Sepsis Labs: @LABRCNTIP (procalcitonin:4,lacticidven:4)  ) Recent Results (from the past 240 hour(s))  Resp Panel by RT-PCR (Flu A&B, Covid) Nasopharyngeal Swab     Status: None   Collection Time: 07/27/21 11:31 AM   Specimen: Nasopharyngeal Swab; Nasopharyngeal(NP) swabs in vial transport medium  Result Value Ref Range Status   SARS Coronavirus 2 by RT PCR NEGATIVE NEGATIVE Final    Comment: (NOTE) SARS-CoV-2 target nucleic acids are NOT DETECTED.  The SARS-CoV-2 RNA is generally detectable in upper respiratory specimens during the acute phase of infection. The lowest concentration of SARS-CoV-2 viral copies this assay can  detect is 138 copies/mL. A negative result does not preclude SARS-Cov-2 infection and should not be used as the sole basis for treatment or other patient management decisions. A negative result may occur with  improper specimen collection/handling, submission of specimen other than nasopharyngeal swab, presence of viral mutation(s) within the areas targeted by this assay, and inadequate number of viral copies(<138 copies/mL). A negative result must be combined with clinical observations, patient history, and epidemiological information. The expected result is Negative.  Fact Sheet for Patients:  EntrepreneurPulse.com.au  Fact Sheet for Healthcare Providers:  IncredibleEmployment.be  This test is no t yet approved or cleared by the [place] FDA and  has been authorized for detection and/or diagnosis of SARS-CoV-2 by FDA under an Emergency Use Authorization (EUA). This EUA will remain  in effect (meaning this test can be used) for the duration of the COVID-19 declaration under Section 564(b)(1) of the Act, 21 U.S.C.section 360bbb-3(b)(1), unless the authorization is terminated  or revoked sooner.       Influenza A by PCR NEGATIVE NEGATIVE Final  Influenza B by PCR NEGATIVE NEGATIVE Final    Comment: (NOTE) The Xpert Xpress SARS-CoV-2/FLU/RSV plus assay is intended as an aid in the diagnosis of influenza from Nasopharyngeal swab specimens and should not be used as a sole basis for treatment. Nasal washings and aspirates are unacceptable for Xpert Xpress SARS-CoV-2/FLU/RSV testing.  Fact Sheet for Patients: EntrepreneurPulse.com.au  Fact Sheet for Healthcare Providers: IncredibleEmployment.be  This test is not yet approved or cleared by the [place] FDA and has been authorized for detection and/or diagnosis of SARS-CoV-2 by FDA under an Emergency Use Authorization (EUA). This EUA will remain in  effect (meaning this test can be used) for the duration of the COVID-19 declaration under Section 564(b)(1) of the Act, 21 U.S.C. section 360bbb-3(b)(1), unless the authorization is terminated or revoked.  Performed at [facility], [place] [address], [place], [place] [address]          Radiology Studies: CT Head Wo Contrast  Result Date: 07/27/2021 CLINICAL DATA:  Altered mental status EXAM: CT HEAD WITHOUT CONTRAST TECHNIQUE: Contiguous axial images were obtained from the base of the skull through the vertex without intravenous contrast. COMPARISON:  05/15/2021 FINDINGS: Brain: There is atrophy and chronic small vessel disease changes. No acute intracranial abnormality. Specifically, no hemorrhage, hydrocephalus, mass lesion, acute infarction, or significant intracranial injury. Vascular: No hyperdense vessel or unexpected calcification. Skull: No acute calvarial abnormality. Sinuses/Orbits: No acute findings Other: None IMPRESSION: Atrophy, chronic microvascular disease. No acute intracranial abnormality. Electronically Signed   By: Rolm Baptise M.D.   On: 07/27/2021 11:42   DG Chest Portable 1 View  Result Date: 07/27/2021 CLINICAL DATA:  Evaluate for pneumonia EXAM: PORTABLE CHEST 1 VIEW COMPARISON:  July 29, 2020 FINDINGS: Right costophrenic angles excluded from the field of view. The heart size and mediastinal contours are within normal limits. Bibasilar linear opacities. Lungs otherwise clear. The visualized skeletal structures are unremarkable. IMPRESSION: Bibasilar linear opacities, likely due to scarring or atelectasis. Electronically Signed   By: Yetta Glassman M.D.   On: 07/27/2021 12:36        Scheduled Meds:  carbidopa-levodopa  2.5 tablet Oral QHS   carbidopa-levodopa  2 tablet Oral Q24H   carbidopa-levodopa  2.5 tablet Oral 2 times per day   carbidopa-levodopa  3 tablet Oral Q24H   donepezil  10 mg Oral QHS   influenza vaccine adjuvanted  0.5  mL Intramuscular Tomorrow-1000   mirabegron ER  25 mg Oral Daily   QUEtiapine  25 mg Oral Daily   And   QUEtiapine  50 mg Oral QHS   sodium chloride flush  3 mL Intravenous Q12H   tamsulosin  0.4 mg Oral BID   vitamin B-12  1,000 mcg Oral Daily   Continuous Infusions:   LOS: 0 days    Time spent: 40 min    Desma Maxim, MD [facility]   If 7PM-7AM, please contact night-coverage [email or web address] Password [facility] 07/28/2021, 9:21 AM

## 2021-07-28 NOTE — Plan of Care (Signed)

## 2021-07-28 NOTE — TOC Initial Note (Signed)
Transition of Care [facility]) - Initial/Assessment Note    Patient Details  Name: Adam Cordova MRN: [id number] Date of Birth: Jan 28, 1956  [facility] CM/SW Contact:    Lennart Pall, LCSW Phone Number: 07/28/2021, 3:49 PM  Clinical Narrative:                 Alerted by RN that wife requesting to speak with SW.  Met wife in the room and introduced myself to her and patient. Pt lying in bed but very restless.  Oriented only to self and highly self-distracted.  Wife asked to talk outside of the room. Wife very tearful and reports that pt was diagnosed with Parkinson's ~ 9 years ago but has become increasing impaired with mobility and cognition over the past few years.  She is the sole caregiver except for a private duty caregiver she has  3 days/ week.  Wife notes that he is having an increasing number of falls and she simply feels he has reached a care level that she can no longer manage at home. We discussed what SNF placement would look like and discussed both short term and long term care.  It appears that this will likely result in need for long term placement and wife aware that this is not covered under insurance.  She states that pt will not currently qualify for Medicaid and she is willing to pay for LTC.  Explained that we can certainly pursue SNF rehab bed to start as pt was ambulatory (although not safe) in the home and will occasionally uses a rw.  Have alerted MD to this information. Will request PT eval as well. TOC will continue to follow.  Expected Discharge Plan: Skilled Nursing Facility Barriers to Discharge: Continued Medical Work up, SNF Pending bed offer   Patient Goals and CMS Choice        Expected Discharge Plan and Services Expected Discharge Plan: [place] In-house Referral: Clinical Social Work   Post Acute Care Choice: [place] Living arrangements for the past 2 months: [place]                                       Prior Living Arrangements/Services Living arrangements for the past 2 months: Single Family Home Lives with:: Spouse Patient language and need for interpreter reviewed:: Yes        Need for Family Participation in Patient Care: Yes (Comment) Care giver support system in place?: Yes (comment)   Criminal Activity/Legal Involvement Pertinent to Current Situation/Hospitalization: No - Comment as needed  Activities of Daily Living Home Assistive Devices/Equipment: Environmental consultant (specify type), Eyeglasses, Shower chair with back, Grab bars around toilet ADL Screening (condition at time of admission) Patient's cognitive ability adequate to safely complete daily activities?: No Is the patient deaf or have difficulty hearing?: No Does the patient have difficulty seeing, even when wearing glasses/contacts?: No Does the patient have difficulty concentrating, remembering, or making decisions?: Yes (has trouble processing, may take a few times of you telling him things before he processes it) Patient able to express need for assistance with ADLs?: No Does the patient have difficulty dressing or bathing?: Yes Independently performs ADLs?: No Communication: Independent Dressing (OT): Needs assistance Is this a change from baseline?: Pre-admission baseline Grooming: Needs assistance Is this a change from baseline?: Pre-admission baseline Feeding: Needs assistance Is this a change from baseline?: Pre-admission  baseline Bathing: Needs assistance Is this a change from baseline?: Pre-admission baseline Toileting: Needs assistance Is this a change from baseline?: Pre-admission baseline In/Out Bed: Needs assistance Is this a change from baseline?: Pre-admission baseline Walks in Home: Needs assistance Is this a change from baseline?: Pre-admission baseline Does the patient have difficulty walking or climbing stairs?: Yes Weakness of Legs: Both Weakness of Arms/Hands: Both  Permission  Sought/Granted Permission sought to share information with : Family Supports Permission granted to share information with : Yes, Verbal Permission Granted  Share Information with NAME: Benigno Check     Permission granted to share info w Relationship: spouse     Emotional Assessment Appearance:: Appears stated age Attitude/Demeanor/Rapport: Other (comment) (no fully oriented)   Orientation: : Oriented to Self Alcohol / Substance Use: Not Applicable Psych Involvement: No (comment)  Admission diagnosis:  Syncope [R55] Syncope, unspecified syncope type [R55] Patient Active Problem List   Diagnosis Date Noted   Essential hypertension 07/28/2021   NSVT (nonsustained ventricular tachycardia)    Syncope 07/27/2021   Complex laceration of ear 03/25/2019   Closed displaced fracture of shaft of fourth metacarpal bone of left hand 09/16/2018   Parkinson's disease ([place]) 10/17/2017   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[place]) 10/17/2017   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   BPH with obstruction/lower urinary tract symptoms 07/09/2017   Gait disturbance 09/18/2016   Poor balance 09/18/2016   Depression 06/11/2016   Excessive sleepiness 06/11/2016   Auditory hallucination 06/11/2016   Hallucination, visual 06/11/2016   Metatarsal fracture 03/27/2015   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

## 2021-07-28 NOTE — Consult Note (Signed)
Cardiology Consultation:   Patient ID: Adam Cordova MRN: [id number]; DOB: [birth year]  Admit date: 07/27/2021 Date of Consult: 07/28/2021  PCP:  [name], MD   [facility] Providers Cardiologist:  None        Patient Profile:   Adam Cordova is a 65 y.o. male with a hx of Parkinson's disease, dementia who is being seen 07/28/2021 for the evaluation of syncope at the request of Dr Tacey Ruiz.  History of Present Illness:   [name] is a 65 year old male with Parkinson's disease, dementia who presents with syncopal episode.  He had been on the couch and had stood up to walk to the kitchen when he collapsed onto his wife.  She caught him and put him on a stool.  Reports he was unconscious for maybe a couple of minutes.  Event was unusual as patient normally does not get up by himself as he requires assistance.  No seizure-like activity was seen.  EMS was called and he was brought to the ED.  In the ED, initial vital signs notable for BP 123/88, pulse 65, SPO2 95% on room air.  Labs notable for creatinine 0.78, sodium 137, potassium 4.5, magnesium 2.2, troponin 6 > 5, hemoglobin 13.2, WBC 10.5, platelets 235.  Chest x-ray with bibasilar linear opacities, likely due to scarring or atelectasis.  Head CT with atrophy and chronic microvascular disease, no acute intracranial abnormality.  EKG shows normal sinus rhythm, rate 67, no ST abnormalities.   Past Medical History:  Diagnosis Date   Autoimmune disease [facility])    wife states this affects his extermities - hands and feet   BPH (benign prostatic hypertrophy) with urinary retention    Foley catheter in place    Parkinson disease ([place])    Dr. Wells Guiles Tat   Parkinson's disease dementia [facility])    UTI (urinary tract infection)     Past Surgical History:  Procedure Laterality Date   TRANSURETHRAL RESECTION OF PROSTATE N/A 11/23/2012   Procedure: TRANSURETHRAL RESECTION OF THE PROSTATE WITH GYRUS INSTRUMENTS;  Surgeon: Bernestine Amass,  MD;  Location: [facility];  Service: Urology;  Laterality: N/A;   TRANSURETHRAL RESECTION OF PROSTATE N/A 07/09/2017   Procedure: TRANSURETHRAL RESECTION OF THE PROSTATE (TURP), BIPOLAR;  Surgeon: Ceasar Mons, MD;  Location: [facility];  Service: Urology;  Laterality: N/A;      Inpatient Medications: Scheduled Meds:  carbidopa-levodopa  2.5 tablet Oral QHS   carbidopa-levodopa  2 tablet Oral Q24H   carbidopa-levodopa  2.5 tablet Oral 2 times per day   carbidopa-levodopa  3 tablet Oral Q24H   donepezil  10 mg Oral QHS   enoxaparin (LOVENOX) injection  40 mg Subcutaneous Daily   influenza vaccine adjuvanted  0.5 mL Intramuscular Tomorrow-1000   mirabegron ER  25 mg Oral Daily   QUEtiapine  25 mg Oral Daily   And   QUEtiapine  50 mg Oral QHS   sodium chloride flush  3 mL Intravenous Q12H   tamsulosin  0.4 mg Oral BID   vitamin B-12  1,000 mcg Oral Daily   Continuous Infusions:  PRN Meds:   Allergies:    Allergies  Allergen Reactions   Bee Venom Anaphylaxis   Levaquin [Levofloxacin Hemihydrate] Anaphylaxis and Swelling   Sertraline Diarrhea and Nausea Only    Social History:   Social History   Socioeconomic History   Marital status: Married    Spouse name: Not on file   Number of children: 2  Years of education: Not on file   Highest education level: Not on file  Occupational History   Occupation: retired    Comment: Visual merchandiser  Tobacco Use   Smoking status: Never   Smokeless tobacco: Never  Vaping Use   Vaping Use: Never used  Substance and Sexual Activity   Alcohol use: Yes    Comment: approx 3 glasses wine per week   Drug use: No   Sexual activity: Not on file  Other Topics Concern   Not on file  Social History Narrative   Right handed   Lives in a two story home with wife and dog   Drinks Caffeine. One cup of coffee in the mornings   Social Determinants of Health   Financial Resource Strain: Not on file  Food  Insecurity: Not on file  Transportation Needs: Not on file  Physical Activity: Not on file  Stress: Not on file  Social Connections: Not on file  Intimate Partner Violence: Not on file    Family History:    Family History  Problem Relation Age of Onset   Dementia Mother    Neuropathy Mother    Heart disease Father    Heart disease Brother      ROS:  Please see the history of present illness.   All other ROS reviewed and negative.     Physical Exam/Data:   Vitals:   07/27/21 2222 07/27/21 2300 07/28/21 0000 07/28/21 0611  BP: 105/87   105/75  Pulse: 86   69  Resp: (!) 22 (!) 23 (!) 21 16  Temp: 97.6 F (36.4 C)   97.9 F (36.6 C)  TempSrc: Oral   Oral  SpO2: 94%   94%  Weight:    75.8 kg  Height:        Intake/Output Summary (Last 24 hours) at 07/28/2021 1037 Last data filed at 07/27/2021 1900 Gross per 24 hour  Intake 240 ml  Output --  Net 240 ml   Last 3 Weights 07/28/2021 07/27/2021 12/28/2020  Weight (lbs) 167 lb 1.7 oz 166 lb 14.2 oz 170 lb  Weight (kg) 75.8 kg 75.7 kg 77.111 kg     Body mass index is 23.98 kg/m.  General:   in no acute distress HEENT: normal Neck: no JVD Cardiac:  normal S1, S2; RRR; no murmur  Lungs:  clear to auscultation bilaterally, no wheezing, rhonchi or rales  Abd: soft, nontender Ext: no edema Musculoskeletal:  No deformities Skin: warm and dry  Neuro: Oriented to person and was able to tell me the month and year Psych: Unable to assess  EKG:  The EKG was personally reviewed and demonstrates:  EKG shows normal sinus rhythm, rate 67, no ST abnormalities. Telemetry:  Telemetry was personally reviewed and demonstrates: Normal sinus rhythm, episode of NSVT x4 beats yesterday.  Prolonged episodes that are being called VT on monitor appear artifact, as patient's QRS marches out through episodes  Relevant CV Studies:   Laboratory Data:  High Sensitivity Troponin:   Recent Labs  Lab 07/27/21 1108 07/27/21 1452   TROPONINIHS 6 5     Chemistry Recent Labs  Lab 07/27/21 1108 07/28/21 0900  NA 137 140  K 4.5 3.8  CL 109 109  CO2 21* 23  GLUCOSE 76 87  BUN 17 17  CREATININE 0.78 0.72  CALCIUM 8.3* 8.8*  GFRNONAA >60 >60  ANIONGAP 7 8    Recent Labs  Lab 07/28/21 0900  PROT 6.9  ALBUMIN 4.1  AST 11*  ALT <5  ALKPHOS 72  BILITOT 1.5*   Lipids No results for input(s): CHOL, TRIG, HDL, LABVLDL, LDLCALC, CHOLHDL in the last 168 hours.  Hematology Recent Labs  Lab 07/27/21 1108  WBC 10.5  RBC 4.85  HGB 13.2  HCT 41.4  MCV 85.4  MCH 27.2  MCHC 31.9  RDW 15.1  PLT 235   Thyroid No results for input(s): TSH, FREET4 in the last 168 hours.  BNPNo results for input(s): BNP, PROBNP in the last 168 hours.  DDimer No results for input(s): DDIMER in the last 168 hours.   Radiology/Studies:  CT Head Wo Contrast  Result Date: 07/27/2021 CLINICAL DATA:  Altered mental status EXAM: CT HEAD WITHOUT CONTRAST TECHNIQUE: Contiguous axial images were obtained from the base of the skull through the vertex without intravenous contrast. COMPARISON:  05/15/2021 FINDINGS: Brain: There is atrophy and chronic small vessel disease changes. No acute intracranial abnormality. Specifically, no hemorrhage, hydrocephalus, mass lesion, acute infarction, or significant intracranial injury. Vascular: No hyperdense vessel or unexpected calcification. Skull: No acute calvarial abnormality. Sinuses/Orbits: No acute findings Other: None IMPRESSION: Atrophy, chronic microvascular disease. No acute intracranial abnormality. Electronically Signed   By: Rolm Baptise M.D.   On: 07/27/2021 11:42   DG Chest Portable 1 View  Result Date: 07/27/2021 CLINICAL DATA:  Evaluate for pneumonia EXAM: PORTABLE CHEST 1 VIEW COMPARISON:  July 29, 2020 FINDINGS: Right costophrenic angles excluded from the field of view. The heart size and mediastinal contours are within normal limits. Bibasilar linear opacities. Lungs otherwise  clear. The visualized skeletal structures are unremarkable. IMPRESSION: Bibasilar linear opacities, likely due to scarring or atelectasis. Electronically Signed   By: Yetta Glassman M.D.   On: 07/27/2021 12:36     Assessment and Plan:    Syncope: As episode occurred after standing from the couch, and patient typically does not stand without assistance, suspect likely orthostasis in setting of Parkinson's disease.  Agree with echocardiogram to rule out structural heart disease.  Continue to monitor on telemetry.  Reportedly was having runs of VT, but appears artifactual, as his QRS marches out through episodes.  Would continue to monitor on telemetry and likely plan monitor on discharge.  NSVT: Prolonged episodes that are being called VT on monitor appear artifact, as patient's QRS marches out through episodes.  Did have short 4 beat run of NSVT on monitor yesterday in ED. Maintain K greater than 4, mag greater than 2.  We will follow-up echocardiogram.  Continue to monitor on telemetry.  For questions or updates, please contact [place] Please consult [email or web address] for contact info under    Signed, Donato Heinz, MD  07/28/2021 10:37 AM

## 2021-07-29 ENCOUNTER — Inpatient Hospital Stay (HOSPITAL_COMMUNITY): Payer: PPO

## 2021-07-29 DIAGNOSIS — R55 Syncope and collapse: Secondary | ICD-10-CM | POA: Diagnosis not present

## 2021-07-29 DIAGNOSIS — F0280 Dementia in other diseases classified elsewhere without behavioral disturbance: Secondary | ICD-10-CM

## 2021-07-29 DIAGNOSIS — N138 Other obstructive and reflux uropathy: Secondary | ICD-10-CM

## 2021-07-29 DIAGNOSIS — N401 Enlarged prostate with lower urinary tract symptoms: Secondary | ICD-10-CM

## 2021-07-29 DIAGNOSIS — D72829 Elevated white blood cell count, unspecified: Secondary | ICD-10-CM

## 2021-07-29 DIAGNOSIS — I4729 Other ventricular tachycardia: Secondary | ICD-10-CM | POA: Diagnosis not present

## 2021-07-29 LAB — BASIC METABOLIC PANEL
Anion gap: 6 (ref 5–15)
BUN: 16 mg/dL (ref 8–23)
CO2: 23 mmol/L (ref 22–32)
Calcium: 8.4 mg/dL — ABNORMAL LOW (ref 8.9–10.3)
Chloride: 109 mmol/L (ref 98–111)
Creatinine, Ser: 0.69 mg/dL (ref 0.61–1.24)
GFR, Estimated: >60 mL/min (ref >60)
Glucose, Bld: 77 mg/dL (ref 70–99)
Potassium: 4.4 mmol/L (ref 3.5–5.1)
Sodium: 138 mmol/L (ref 135–145)

## 2021-07-29 LAB — MAGNESIUM: Magnesium: 2.2 mg/dL (ref 1.7–2.4)

## 2021-07-29 LAB — GLUCOSE, CAPILLARY
Glucose-Capillary: 88 mg/dL (ref 70–99)
Glucose-Capillary: 100 mg/dL — ABNORMAL HIGH (ref 70–99)

## 2021-07-29 MED ORDER — SODIUM CHLORIDE 0.9 % IV SOLN: INTRAVENOUS | Status: DC

## 2021-07-29 NOTE — NC FL2 (Deleted)
[place] LEVEL OF CARE SCREENING TOOL     IDENTIFICATION  Patient Name: Adam Cordova Birthdate: May 03, 1956 Sex: male Admission Date (Current Location): 07/27/2021  [facility] and [place] Number:  Herbalist and Address:  [facility],  [address] [place], [place]      Provider Number: [id number]  Attending Physician Name and Address:  Eugenie Filler, MD  Relative Name and Phone Number:  Wife - Dequavion Follette - # [phone]    Current Level of Care: Hospital Recommended Level of Care: [place] Prior Approval Number:    Date Approved/Denied:   PASRR Number:    Discharge Plan: SNF    Current Diagnoses: Patient Active Problem List   Diagnosis Date Noted   Essential hypertension 07/28/2021   NSVT (nonsustained ventricular tachycardia)    Syncope 07/27/2021   Complex laceration of ear 03/25/2019   Closed displaced fracture of shaft of fourth metacarpal bone of left hand 09/16/2018   Parkinson's disease ([place]) 10/17/2017   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[place]) 10/17/2017   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   BPH with obstruction/lower urinary tract symptoms 07/09/2017   Gait disturbance 09/18/2016   Poor balance 09/18/2016   Depression 06/11/2016   Excessive sleepiness 06/11/2016   Auditory hallucination 06/11/2016   Hallucination, visual 06/11/2016   Metatarsal fracture 03/27/2015   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   Acute urinary retention 11/23/2012   BPH (benign prostatic hyperplasia) 11/23/2012   Enlarged prostate without lower urinary tract symptoms (luts) 11/23/2012   Parkinson disease ([place]) 05/18/2012    Orientation RESPIRATION BLADDER Height & Weight     Self  Normal Continent Weight: 166 lb 7.2 oz (75.5 kg) Height:  5\' 10"  (177.8 cm)  BEHAVIORAL SYMPTOMS/MOOD NEUROLOGICAL BOWEL NUTRITION STATUS      Continent Diet  AMBULATORY  STATUS COMMUNICATION OF NEEDS Skin   Limited Assist Verbally Skin abrasions (Anus;Elbow;Knee;Leg - Right and Left)                       Personal Care Assistance Level of Assistance  Bathing, Dressing Bathing Assistance: Limited assistance   Dressing Assistance: Limited assistance     Functional Limitations Info             SPECIAL CARE FACTORS FREQUENCY  PT (By licensed PT), OT (By licensed OT)     PT Frequency: Minimum of 5 X's per week OT Frequency: Minimum of 5 X's per week            Contractures Contractures Info: Not present    Additional Factors Info  Code Status Code Status Info: DNR             Current Medications (07/29/2021):  This is the current hospital active medication list Current Facility-Administered Medications  Medication Dose Route Frequency Provider Last Rate Last Admin   0.9 %  sodium chloride infusion   Intravenous Continuous Eugenie Filler, MD 100 mL/hr at 07/29/21 1444 New Bag at 07/29/21 1444   carbidopa-levodopa (SINEMET CR) 50-200 MG per tablet controlled release 1 tablet  1 tablet Oral QHS Gwynne Edinger, MD   1 tablet at 07/28/21 2035   carbidopa-levodopa (SINEMET IR) 25-100 MG per tablet immediate release 2 tablet  2 tablet Oral Q24H Kyle, Tyrone A, DO   2 tablet at 07/29/21 0945   carbidopa-levodopa (SINEMET IR) 25-100 MG per tablet immediate release 2.5 tablet  2.5 tablet Oral  2 times per day Cherylann Ratel A, DO   2.5 tablet at 07/29/21 1744   carbidopa-levodopa (SINEMET IR) 25-100 MG per tablet immediate release 3 tablet  3 tablet Oral Q24H Marylyn Ishihara, Tyrone A, DO   3 tablet at 07/29/21 0800   donepezil (ARICEPT) tablet 10 mg  10 mg Oral QHS Kyle, Tyrone A, DO   10 mg at 07/28/21 2036   enoxaparin (LOVENOX) injection 40 mg  40 mg Subcutaneous Daily Gwynne Edinger, MD   40 mg at 07/29/21 0948   influenza vaccine adjuvanted (FLUAD) injection 0.5 mL  0.5 mL Intramuscular Tomorrow-1000 Marylyn Ishihara, Tyrone A, DO       mirabegron ER  (MYRBETRIQ) tablet 25 mg  25 mg Oral Daily Kyle, Tyrone A, DO   25 mg at 07/29/21 0948   QUEtiapine (SEROQUEL) tablet 25 mg  25 mg Oral Daily Kyle, Tyrone A, DO   25 mg at 07/29/21 0948   And   QUEtiapine (SEROQUEL) tablet 50 mg  50 mg Oral QHS Kyle, Tyrone A, DO   50 mg at 07/28/21 2035   sodium chloride flush (NS) 0.9 % injection 3 mL  3 mL Intravenous Q12H Kyle, Tyrone A, DO   3 mL at 07/29/21 0949   tamsulosin (FLOMAX) capsule 0.4 mg  0.4 mg Oral BID Marylyn Ishihara, Tyrone A, DO   0.4 mg at 07/29/21 1747   vitamin B-12 (CYANOCOBALAMIN) tablet 1,000 mcg  1,000 mcg Oral Daily Marylyn Ishihara, Tyrone A, DO   1,000 mcg at 07/29/21 4388     Discharge Medications: Please see discharge summary for a list of discharge medications.  Relevant Imaging Results:  Relevant Lab Results:   Additional Information [employer]  Cordel Drewes, Marta Lamas, LCSW

## 2021-07-29 NOTE — Progress Notes (Signed)
Progress Note  Patient Name: Adam Cordova Date of Encounter: 07/29/2021  [facility] Cardiologist: None   Subjective   Oriented to person  Inpatient Medications    Scheduled Meds:  carbidopa-levodopa  1 tablet Oral QHS   carbidopa-levodopa  2 tablet Oral Q24H   carbidopa-levodopa  2.5 tablet Oral 2 times per day   carbidopa-levodopa  3 tablet Oral Q24H   donepezil  10 mg Oral QHS   enoxaparin (LOVENOX) injection  40 mg Subcutaneous Daily   influenza vaccine adjuvanted  0.5 mL Intramuscular Tomorrow-1000   mirabegron ER  25 mg Oral Daily   QUEtiapine  25 mg Oral Daily   And   QUEtiapine  50 mg Oral QHS   sodium chloride flush  3 mL Intravenous Q12H   tamsulosin  0.4 mg Oral BID   vitamin B-12  1,000 mcg Oral Daily   Continuous Infusions:  PRN Meds:    Vital Signs    Vitals:   07/28/21 1435 07/28/21 2052 07/29/21 0407 07/29/21 0500  BP: (!) 163/97 (!) 163/98 (!) 152/99   Pulse: 80 78 75   Resp: 20 14 16    Temp: 98.5 F (36.9 C) 98.2 F (36.8 C) (!) 97.3 F (36.3 C)   TempSrc: Oral Oral Oral   SpO2: 98% 98% 95%   Weight:    75.5 kg  Height:        Intake/Output Summary (Last 24 hours) at 07/29/2021 2956 Last data filed at 07/29/2021 0408 Gross per 24 hour  Intake 480 ml  Output 900 ml  Net -420 ml   Last 3 Weights 07/29/2021 07/28/2021 07/27/2021  Weight (lbs) 166 lb 7.2 oz 167 lb 1.7 oz 166 lb 14.2 oz  Weight (kg) 75.5 kg 75.8 kg 75.7 kg      Telemetry    NSR, no NSVT - Personally Reviewed  ECG    No new ECG - Personally Reviewed  Physical Exam   GEN: No acute distress.   Neck: No JVD Cardiac: RRR, no murmurs, rubs, or gallops.  Respiratory: Clear to auscultation bilaterally. GI: Soft, nontender MS: No edema Neuro:  Oriented to person Psych: Unable to assess  Labs    High Sensitivity Troponin:   Recent Labs  Lab 07/27/21 1108 07/27/21 1452  TROPONINIHS 6 5     Chemistry Recent Labs  Lab 07/27/21 1108 07/28/21 0900  07/28/21 0913 07/29/21 0529  NA 137 140  --  138  K 4.5 3.8  --  4.4  CL 109 109  --  109  CO2 21* 23  --  23  GLUCOSE 76 87  --  77  BUN 17 17  --  16  CREATININE 0.78 0.72  --  0.69  CALCIUM 8.3* 8.8*  --  8.4*  MG  --   --  2.2 2.2  PROT  --  6.9  --   --   ALBUMIN  --  4.1  --   --   AST  --  11*  --   --   ALT  --  <5  --   --   ALKPHOS  --  72  --   --   BILITOT  --  1.5*  --   --   GFRNONAA >60 >60  --  >60  ANIONGAP 7 8  --  6    Lipids No results for input(s): CHOL, TRIG, HDL, LABVLDL, LDLCALC, CHOLHDL in the last 168 hours.  Hematology Recent Labs  Lab 07/27/21  1108 07/28/21 0900  WBC 10.5 14.6*  RBC 4.85 5.21  HGB 13.2 13.9  HCT 41.4 44.0  MCV 85.4 84.5  MCH 27.2 26.7  MCHC 31.9 31.6  RDW 15.1 14.9  PLT 235 273   Thyroid No results for input(s): TSH, FREET4 in the last 168 hours.  BNPNo results for input(s): BNP, PROBNP in the last 168 hours.  DDimer No results for input(s): DDIMER in the last 168 hours.   Radiology    CT Head Wo Contrast  Result Date: 07/27/2021 CLINICAL DATA:  Altered mental status EXAM: CT HEAD WITHOUT CONTRAST TECHNIQUE: Contiguous axial images were obtained from the base of the skull through the vertex without intravenous contrast. COMPARISON:  05/15/2021 FINDINGS: Brain: There is atrophy and chronic small vessel disease changes. No acute intracranial abnormality. Specifically, no hemorrhage, hydrocephalus, mass lesion, acute infarction, or significant intracranial injury. Vascular: No hyperdense vessel or unexpected calcification. Skull: No acute calvarial abnormality. Sinuses/Orbits: No acute findings Other: None IMPRESSION: Atrophy, chronic microvascular disease. No acute intracranial abnormality. Electronically Signed   By: Rolm Baptise M.D.   On: 07/27/2021 11:42   DG Chest Portable 1 View  Result Date: 07/27/2021 CLINICAL DATA:  Evaluate for pneumonia EXAM: PORTABLE CHEST 1 VIEW COMPARISON:  July 29, 2020 FINDINGS: Right  costophrenic angles excluded from the field of view. The heart size and mediastinal contours are within normal limits. Bibasilar linear opacities. Lungs otherwise clear. The visualized skeletal structures are unremarkable. IMPRESSION: Bibasilar linear opacities, likely due to scarring or atelectasis. Electronically Signed   By: Yetta Glassman M.D.   On: 07/27/2021 12:36   ECHOCARDIOGRAM COMPLETE  Result Date: 07/28/2021    ECHOCARDIOGRAM REPORT   Patient Name:   Adam Cordova Date of Exam: 07/28/2021 Medical Rec #:  [id number]     Height:       70.0 in Accession #:    [id number]    Weight:       167.1 lb Date of Birth:  1956/05/17     BSA:          1.934 m Patient Age:    65 years      BP:           105/75 mmHg Patient Gender: M             HR:           74 bpm. Exam Location:  Inpatient Procedure: 2D Echo, Cardiac Doppler and Color Doppler Indications:    Syncope  History:        Patient has no prior history of Echocardiogram examinations.  Sonographer:    Glo Herring Referring Phys: [id number] [place]  1. Left ventricular ejection fraction, by estimation, is 60 to 65%. The left ventricle has normal function. Left ventricular endocardial border not optimally defined to evaluate regional wall motion. There is mild left ventricular hypertrophy. Left ventricular diastolic parameters are consistent with Grade I diastolic dysfunction (impaired relaxation).  2. Right ventricular systolic function is normal. The right ventricular size is mildly enlarged.  3. Left atrial size was mildly dilated.  4. The mitral valve is grossly normal. No evidence of mitral valve regurgitation. No evidence of mitral stenosis.  5. The aortic valve is tricuspid. Aortic valve regurgitation is not visualized. No aortic stenosis is present.  6. The inferior vena cava is normal in size with greater than 50% respiratory variability, suggesting right atrial pressure of 3 mmHg. FINDINGS  Left Ventricle: Left ventricular  ejection fraction,  by estimation, is 60 to 65%. The left ventricle has normal function. Left ventricular endocardial border not optimally defined to evaluate regional wall motion. The left ventricular internal cavity size was normal in size. There is mild left ventricular hypertrophy. Left ventricular diastolic parameters are consistent with Grade I diastolic dysfunction (impaired relaxation). Right Ventricle: The right ventricular size is mildly enlarged. No increase in right ventricular wall thickness. Right ventricular systolic function is normal. Left Atrium: Left atrial size was mildly dilated. Right Atrium: Right atrial size was not well visualized. Pericardium: There is no evidence of pericardial effusion. Mitral Valve: The mitral valve is grossly normal. Mild mitral annular calcification. No evidence of mitral valve regurgitation. No evidence of mitral valve stenosis. Tricuspid Valve: The tricuspid valve is normal in structure. Tricuspid valve regurgitation is trivial. No evidence of tricuspid stenosis. Aortic Valve: The aortic valve is tricuspid. Aortic valve regurgitation is not visualized. No aortic stenosis is present. Aortic valve mean gradient measures 3.0 mmHg. Aortic valve peak gradient measures 5.7 mmHg. Aortic valve area, by VTI measures 2.43 cm. Pulmonic Valve: The pulmonic valve was normal in structure. Pulmonic valve regurgitation is trivial. No evidence of pulmonic stenosis. Aorta: The aortic root is normal in size and structure. Venous: The inferior vena cava is normal in size with greater than 50% respiratory variability, suggesting right atrial pressure of 3 mmHg. IAS/Shunts: No atrial level shunt detected by color flow Doppler.  LEFT VENTRICLE PLAX 2D LVIDd:         5.00 cm      Diastology LVIDs:         3.20 cm      LV e' medial:    7.62 cm/s LV PW:         1.20 cm      LV E/e' medial:  6.9 LV IVS:        1.20 cm      LV e' lateral:   11.20 cm/s LVOT diam:     2.15 cm      LV E/e'  lateral: 4.7 LV SV:         53 LV SV Index:   28 LVOT Area:     3.63 cm  LV Volumes (MOD) LV vol d, MOD A2C: 115.0 ml LV vol d, MOD A4C: 125.0 ml LV vol s, MOD A2C: 51.8 ml LV vol s, MOD A4C: 56.9 ml LV SV MOD A2C:     63.2 ml LV SV MOD A4C:     125.0 ml LV SV MOD BP:      66.7 ml IVC IVC diam: 1.30 cm LEFT ATRIUM             Index LA diam:        3.80 cm 1.96 cm/m LA Vol (A2C):   63.9 ml 33.04 ml/m LA Vol (A4C):   83.8 ml 43.33 ml/m LA Biplane Vol: 77.3 ml 39.97 ml/m  AORTIC VALVE                    PULMONIC VALVE AV Area (Vmax):    2.29 cm     PV Vmax:       0.71 m/s AV Area (Vmean):   2.39 cm     PV Peak grad:  2.0 mmHg AV Area (VTI):     2.43 cm AV Vmax:           119.00 cm/s AV Vmean:          81.700 cm/s AV VTI:  0.220 m AV Peak Grad:      5.7 mmHg AV Mean Grad:      3.0 mmHg LVOT Vmax:         75.10 cm/s LVOT Vmean:        53.700 cm/s LVOT VTI:          0.147 m LVOT/AV VTI ratio: 0.67  AORTA Ao Root diam: 3.70 cm MITRAL VALVE MV Area (PHT): 4.39 cm    SHUNTS MV Decel Time: 173 msec    Systemic VTI:  0.15 m MV E velocity: 52.30 cm/s  Systemic Diam: 2.15 cm MV A velocity: 68.60 cm/s MV E/A ratio:  0.76 Cherlynn Kaiser MD Electronically signed by Cherlynn Kaiser MD Signature Date/Time: 07/28/2021/1:18:22 PM    Final     Cardiac Studies   Echo 07/28/21:  1. Left ventricular ejection fraction, by estimation, is 60 to 65%. The  left ventricle has normal function. Left ventricular endocardial border  not optimally defined to evaluate regional wall motion. There is mild left  ventricular hypertrophy. Left  ventricular diastolic parameters are consistent with Grade I diastolic  dysfunction (impaired relaxation).   2. Right ventricular systolic function is normal. The right ventricular  size is mildly enlarged.   3. Left atrial size was mildly dilated.   4. The mitral valve is grossly normal. No evidence of mitral valve  regurgitation. No evidence of mitral stenosis.   5. The aortic  valve is tricuspid. Aortic valve regurgitation is not  visualized. No aortic stenosis is present.   6. The inferior vena cava is normal in size with greater than 50%  respiratory variability, suggesting right atrial pressure of 3 mmHg.   Patient Profile     65 y.o. male with a hx of Parkinson's disease, dementia who is being seen for the evaluation of syncope   Assessment & Plan    Syncope: As episode occurred after standing from the couch, and patient typically does not stand without assistance, suspect likely orthostasis in setting of Parkinson's disease.  No structural heart disease on echocardiogram -Brief NSVT in the ED.  Will plan cardiac monitor on discharge  NSVT: Prolonged episodes that are being called VT on monitor appear artifact, as patient's QRS marches out through episodes.  Did have short episode of NSVT lasting few beats on monitor in ED. Maintain K greater than 4, mag greater than 2.  No structural heart disease on echocardiogram.  We will plan monitor on discharge  For questions or updates, please contact [place] Please consult [email or web address] for contact info under        Signed, Donato Heinz, MD  07/29/2021, 9:22 AM

## 2021-07-29 NOTE — TOC Transition Note (Signed)
Transition of Care [facility]) - CM/SW Discharge Note   Patient Details  Name: Adam Cordova MRN: [id number] Date of Birth: [birth year]  Transition of Care [facility]) CM/SW Contact:  Buel Molder, Marta Lamas, LCSW Phone Number: 07/29/2021, 6:30 PM   Clinical Narrative:     PT is recommending SNF placement.  CSW attempted to contact patient's wife, Marquez Ceesay to discuss discharge plan of care, without success.  HIPAA compliant message left on voicemail.  Patient not oriented to appropriately discuss discharge plans with CSW.  FL-2 Form completed and faxed via [place].  Awaiting bed offers.    Barriers to Discharge: Continued Medical Work up, SNF Pending bed offer   Patient Goals and CMS Choice      Short-Term SNF for rehab.  Discharge Placement                 To be determined.      Discharge Plan and Services In-house Referral: Clinical Social Work   Post Acute Care Choice: [facility]                               Social Determinants of Health (SDOH) Interventions    SNF  Readmission Risk Interventions No flowsheet data found.  Nat Christen, BSW, MSW, [employer]  Licensed Holiday representative  [employer]  Mailing Address-[address], [place], [place] [address] Physical Address-[address] [place], [place] [address] Toll Free Main # [phone] Fax # [phone] Cell # [phone]  [email or web address]

## 2021-07-29 NOTE — Progress Notes (Signed)
PROGRESS NOTE    HARLES EVETTS  ZJQ:[id number] DOB: 05/23/1956 DOA: 07/27/2021 PCP: [name], MD    Chief Complaint  Patient presents with   Loss of Consciousness    Brief Narrative:  From admission h and p ERASTO SLEIGHT is a 65 y.o. male with medical history significant of Parkinson's disease, dementia, narcolepsy, BPH. Presenting with syncopal episode. History from wife at bedside. He was on his couch this morning. He got up and moved to the kitchen. As he entered the kitchen, he collapsed onto his wife. She was able to catch him and rest him on a stool. He laid his head on her shoulder and "was out" for at least 5 minutes. There was no tonic-clonic movement, tongue swallowing, foaming at the mouth, or incontinence. When he came to, he was a little groggy. EMS was called.    His wife reports that last night he had a couple of runs of severe full body tremors. He's never had these events before. She denies any other aggravating or alleviating factors.     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 -Syncope was noted to be witnessed, no signs of seizures noted.  No trauma noted. -CT head negative. -Telemetry did show some nonsustained V. tach being followed by cardiology. -Flomax and anti-Parkinson's agents and Aricept could likely be contributing however wife does not feel that patient can go without his medications at this time. -Place TED hose. -IV fluids. -Patient seen in consultation by cardiology who feel syncope likely in the setting of Parkinson's disease, no structural heart  disease noted on echocardiogram. -Supportive care.  2.  Nonsustained V. tach -Noted on telemetry. -Patient seen in consultation by cardiology who noted prolonged episodes being called V. tach on monitor that appears at effect as patient's QRS marches through episodes per cardiology. -Cardiology did note that patient had a short episode of NSVT lasting a few beats on the monitor in the ED, 2D echo with no structural heart disease noted. -Cardiology recommending to keep potassium greater than 4, magnesium greater than 2 and plan for monitor on discharge. -Appreciate cardiology input and recommendations.  3.  Parkinson's disease/Parkinson's dementia -Per wife has been progressive and worsening. -Being followed by neurology in the outpatient setting -Per wife palliative care was followed in the outpatient setting however has not been seen by palliative care in several weeks and wife interested in palliative care consultation during this hospitalization. -Wife does not feel she can care for patient safely at home at this time as she feels Parkinson's disease has progressed. -Continue current regimen of Sinemet, Aricept, Seroquel. -TOC consulted for placement. -Consulted palliative care for goals of care and further recommendations as to whether patient has progressed to the point whereby may need a monitored unit versus transitioning to hospice.  4.  BPH -Continue Myrbetriq, Flomax. -It is noted per Dr.Wouk, that wife use a Flomax is discontinued patient will retain urine. -Follow-up.  5.  Falls -Secondary to problem #3. -PT/OT.  6.  Leukocytosis -Likely reactive leukocytosis. -Urinalysis done negative for UTI. -Chest x-ray done negative for any acute infiltrate. -Patient currently afebrile. -Follow-up.    DVT prophylaxis: Lovenox  Code Status: DNR Family Communication: Updated wife at bedside. Disposition:   Status is: Inpatient  Remains inpatient appropriate because: Severity  of illness/unsafe disposition.       Consultants:  Cardiology: Dr. Gardiner Rhyme 07/28/2021  Procedures:  CT head 07/27/2021 Chest x-ray 07/29/2021, 07/27/2021 2D echo 07/28/2021  Antimicrobials:  None   Subjective: Patient noted to have had a fall earlier on per RN with no pain noted after fall and no bony prominences, it is noted that patient did not hit his head.  Patient currently asleep and resting.  Wife at bedside.  Wife stating unable to care for patient and feels Parkinson's has progressively worsened.  Wife interested in palliative care consultation.  Objective: Vitals:   07/29/21 0957 07/29/21 1003 07/29/21 1243 07/29/21 1413  BP: (!) 148/125 (!) 143/124 102/80 (!) 144/73  Pulse: 83 86 60 72  Resp:    15  Temp:   98.3 F (36.8 C)   TempSrc:   Oral   SpO2: 95% 97% 97% 98%  Weight:      Height:        Intake/Output Summary (Last 24 hours) at 07/29/2021 1950 Last data filed at 07/29/2021 1700 Gross per 24 hour  Intake 460 ml  Output 450 ml  Net 10 ml   Filed Weights   07/27/21 1811 07/28/21 0611 07/29/21 0500  Weight: 75.7 kg 75.8 kg 75.5 kg    Examination:  General exam: Sleeping comfortably Respiratory system: Clear to auscultation anterior lung fields with. Respiratory effort normal. Cardiovascular system: S1 & S2 heard, RRR. No JVD, murmurs, rubs, gallops or clicks. No pedal edema. Gastrointestinal system: Abdomen is nondistended, soft and nontender. No organomegaly or masses felt. Normal bowel sounds heard. Central nervous system: Asleep.  Moving extremity spontaneously. No focal neurological deficits. Extremities: Symmetric 5 x 5 power. Skin: No rashes, lesions or ulcers Psychiatry: Judgement and insight unable to assess. Mood & affect appropriate.     Data Reviewed: I have personally reviewed following labs and imaging studies  CBC: Recent Labs  Lab 07/27/21 1108 07/28/21 0900  WBC 10.5 14.6*  NEUTROABS 8.1*  --   HGB 13.2 13.9  HCT 41.4  44.0  MCV 85.4 84.5  PLT 235 433    Basic Metabolic Panel: Recent Labs  Lab 07/27/21 1108 07/28/21 0900 07/28/21 0913 07/29/21 0529  NA 137 140  --  138  K 4.5 3.8  --  4.4  CL 109 109  --  109  CO2 21* 23  --  23  GLUCOSE 76 87  --  77  BUN 17 17  --  16  CREATININE 0.78 0.72  --  0.69  CALCIUM 8.3* 8.8*  --  8.4*  MG  --   --  2.2 2.2    GFR: Estimated Creatinine Clearance: 92.5 mL/min (by C-G formula based on SCr of 0.69 mg/dL).  Liver Function Tests: Recent Labs  Lab 07/28/21 0900  AST 11*  ALT <5  ALKPHOS 72  BILITOT 1.5*  PROT 6.9  ALBUMIN 4.1    CBG: Recent Labs  Lab 07/28/21 0526 07/29/21 0405 07/29/21 1245  GLUCAP 86 88 100*     Recent Results (from the past 240 hour(s))  Resp Panel by RT-PCR (Flu A&B, Covid) Nasopharyngeal Swab     Status: None   Collection Time: 07/27/21 11:31 AM   Specimen: Nasopharyngeal Swab; Nasopharyngeal(NP) swabs in vial transport medium  Result Value Ref Range Status   SARS Coronavirus 2 by RT PCR NEGATIVE NEGATIVE Final  Comment: (NOTE) SARS-CoV-2 target nucleic acids are NOT DETECTED.  The SARS-CoV-2 RNA is generally detectable in upper respiratory specimens during the acute phase of infection. The lowest concentration of SARS-CoV-2 viral copies this assay can detect is 138 copies/mL. A negative result does not preclude SARS-Cov-2 infection and should not be used as the sole basis for treatment or other patient management decisions. A negative result may occur with  improper specimen collection/handling, submission of specimen other than nasopharyngeal swab, presence of viral mutation(s) within the areas targeted by this assay, and inadequate number of viral copies(<138 copies/mL). A negative result must be combined with clinical observations, patient history, and epidemiological information. The expected result is Negative.  Fact Sheet for Patients:  EntrepreneurPulse.com.au  Fact Sheet  for Healthcare Providers:  IncredibleEmployment.be  This test is no t yet approved or cleared by the [place] FDA and  has been authorized for detection and/or diagnosis of SARS-CoV-2 by FDA under an Emergency Use Authorization (EUA). This EUA will remain  in effect (meaning this test can be used) for the duration of the COVID-19 declaration under Section 564(b)(1) of the Act, 21 U.S.C.section 360bbb-3(b)(1), unless the authorization is terminated  or revoked sooner.       Influenza A by PCR NEGATIVE NEGATIVE Final   Influenza B by PCR NEGATIVE NEGATIVE Final    Comment: (NOTE) The Xpert Xpress SARS-CoV-2/FLU/RSV plus assay is intended as an aid in the diagnosis of influenza from Nasopharyngeal swab specimens and should not be used as a sole basis for treatment. Nasal washings and aspirates are unacceptable for Xpert Xpress SARS-CoV-2/FLU/RSV testing.  Fact Sheet for Patients: EntrepreneurPulse.com.au  Fact Sheet for Healthcare Providers: IncredibleEmployment.be  This test is not yet approved or cleared by the [place] FDA and has been authorized for detection and/or diagnosis of SARS-CoV-2 by FDA under an Emergency Use Authorization (EUA). This EUA will remain in effect (meaning this test can be used) for the duration of the COVID-19 declaration under Section 564(b)(1) of the Act, 21 U.S.C. section 360bbb-3(b)(1), unless the authorization is terminated or revoked.  Performed at [facility], [place] [address] [place], [place] [address]          Radiology Studies: DG CHEST PORT 1 VIEW  Result Date: 07/29/2021 CLINICAL DATA:  Leukocytosis, confusion, Parkinson's EXAM: PORTABLE CHEST 1 VIEW COMPARISON:  Portable exam 1107 hours compared to 07/27/2021 FINDINGS: Upper normal size of cardiac silhouette, likely accentuated by kyphosis. Mediastinal contours and pulmonary vascularity normal.  Bibasilar atelectasis. No infiltrate, pleural effusion, or pneumothorax. IMPRESSION: Mild bibasilar atelectasis. Electronically Signed   By: Lavonia Dana M.D.   On: 07/29/2021 13:34   ECHOCARDIOGRAM COMPLETE  Result Date: 07/28/2021    ECHOCARDIOGRAM REPORT   Patient Name:   PAUL TRETTIN Date of Exam: 07/28/2021 Medical Rec #:  [id number]     Height:       70.0 in Accession #:    [id number]    Weight:       167.1 lb Date of Birth:  08-29-1956     BSA:          1.934 m Patient Age:    42 years      BP:           105/75 mmHg Patient Gender: M             HR:           74 bpm. Exam Location:  Inpatient Procedure: 2D Echo, Cardiac Doppler and  Color Doppler Indications:    Syncope  History:        Patient has no prior history of Echocardiogram examinations.  Sonographer:    Glo Herring Referring Phys: [id number] [place]  1. Left ventricular ejection fraction, by estimation, is 60 to 65%. The left ventricle has normal function. Left ventricular endocardial border not optimally defined to evaluate regional wall motion. There is mild left ventricular hypertrophy. Left ventricular diastolic parameters are consistent with Grade I diastolic dysfunction (impaired relaxation).  2. Right ventricular systolic function is normal. The right ventricular size is mildly enlarged.  3. Left atrial size was mildly dilated.  4. The mitral valve is grossly normal. No evidence of mitral valve regurgitation. No evidence of mitral stenosis.  5. The aortic valve is tricuspid. Aortic valve regurgitation is not visualized. No aortic stenosis is present.  6. The inferior vena cava is normal in size with greater than 50% respiratory variability, suggesting right atrial pressure of 3 mmHg. FINDINGS  Left Ventricle: Left ventricular ejection fraction, by estimation, is 60 to 65%. The left ventricle has normal function. Left ventricular endocardial border not optimally defined to evaluate regional wall motion. The left  ventricular internal cavity size was normal in size. There is mild left ventricular hypertrophy. Left ventricular diastolic parameters are consistent with Grade I diastolic dysfunction (impaired relaxation). Right Ventricle: The right ventricular size is mildly enlarged. No increase in right ventricular wall thickness. Right ventricular systolic function is normal. Left Atrium: Left atrial size was mildly dilated. Right Atrium: Right atrial size was not well visualized. Pericardium: There is no evidence of pericardial effusion. Mitral Valve: The mitral valve is grossly normal. Mild mitral annular calcification. No evidence of mitral valve regurgitation. No evidence of mitral valve stenosis. Tricuspid Valve: The tricuspid valve is normal in structure. Tricuspid valve regurgitation is trivial. No evidence of tricuspid stenosis. Aortic Valve: The aortic valve is tricuspid. Aortic valve regurgitation is not visualized. No aortic stenosis is present. Aortic valve mean gradient measures 3.0 mmHg. Aortic valve peak gradient measures 5.7 mmHg. Aortic valve area, by VTI measures 2.43 cm. Pulmonic Valve: The pulmonic valve was normal in structure. Pulmonic valve regurgitation is trivial. No evidence of pulmonic stenosis. Aorta: The aortic root is normal in size and structure. Venous: The inferior vena cava is normal in size with greater than 50% respiratory variability, suggesting right atrial pressure of 3 mmHg. IAS/Shunts: No atrial level shunt detected by color flow Doppler.  LEFT VENTRICLE PLAX 2D LVIDd:         5.00 cm      Diastology LVIDs:         3.20 cm      LV e' medial:    7.62 cm/s LV PW:         1.20 cm      LV E/e' medial:  6.9 LV IVS:        1.20 cm      LV e' lateral:   11.20 cm/s LVOT diam:     2.15 cm      LV E/e' lateral: 4.7 LV SV:         53 LV SV Index:   28 LVOT Area:     3.63 cm  LV Volumes (MOD) LV vol d, MOD A2C: 115.0 ml LV vol d, MOD A4C: 125.0 ml LV vol s, MOD A2C: 51.8 ml LV vol s, MOD A4C:  56.9 ml LV SV MOD A2C:     63.2 ml LV  SV MOD A4C:     125.0 ml LV SV MOD BP:      66.7 ml IVC IVC diam: 1.30 cm LEFT ATRIUM             Index LA diam:        3.80 cm 1.96 cm/m LA Vol (A2C):   63.9 ml 33.04 ml/m LA Vol (A4C):   83.8 ml 43.33 ml/m LA Biplane Vol: 77.3 ml 39.97 ml/m  AORTIC VALVE                    PULMONIC VALVE AV Area (Vmax):    2.29 cm     PV Vmax:       0.71 m/s AV Area (Vmean):   2.39 cm     PV Peak grad:  2.0 mmHg AV Area (VTI):     2.43 cm AV Vmax:           119.00 cm/s AV Vmean:          81.700 cm/s AV VTI:            0.220 m AV Peak Grad:      5.7 mmHg AV Mean Grad:      3.0 mmHg LVOT Vmax:         75.10 cm/s LVOT Vmean:        53.700 cm/s LVOT VTI:          0.147 m LVOT/AV VTI ratio: 0.67  AORTA Ao Root diam: 3.70 cm MITRAL VALVE MV Area (PHT): 4.39 cm    SHUNTS MV Decel Time: 173 msec    Systemic VTI:  0.15 m MV E velocity: 52.30 cm/s  Systemic Diam: 2.15 cm MV A velocity: 68.60 cm/s MV E/A ratio:  0.76 Cherlynn Kaiser MD Electronically signed by Cherlynn Kaiser MD Signature Date/Time: 07/28/2021/1:18:22 PM    Final         Scheduled Meds:  carbidopa-levodopa  1 tablet Oral QHS   carbidopa-levodopa  2 tablet Oral Q24H   carbidopa-levodopa  2.5 tablet Oral 2 times per day   carbidopa-levodopa  3 tablet Oral Q24H   donepezil  10 mg Oral QHS   enoxaparin (LOVENOX) injection  40 mg Subcutaneous Daily   influenza vaccine adjuvanted  0.5 mL Intramuscular Tomorrow-1000   mirabegron ER  25 mg Oral Daily   QUEtiapine  25 mg Oral Daily   And   QUEtiapine  50 mg Oral QHS   sodium chloride flush  3 mL Intravenous Q12H   tamsulosin  0.4 mg Oral BID   vitamin B-12  1,000 mcg Oral Daily   Continuous Infusions:  sodium chloride 100 mL/hr at 07/29/21 1444     LOS: 1 day    Time spent: 40 minutes    Irine Seal, MD [facility]   To contact the attending provider between 7A-7P or the covering provider during after hours 7P-7A, please log into the web  site [email or web address] and access using universal [place] password for that web site. If you do not have the password, please call the hospital operator.  07/29/2021, 7:50 PM

## 2021-07-29 NOTE — Progress Notes (Signed)
Patient found sitting on floor by charge nurse. This nurse alert to bed chair alarm went to patients' room. Charge nurse in room, patient assessed, pt verbalized no pain to any bony prominences, patient did not hit head. No, open areas to bottom, no bruises, general redness noted before fall. Pt had large bowel movement. MD made aware via AMION. Wife updated by nurse at bedside.

## 2021-07-29 NOTE — Evaluation (Signed)
Physical Therapy Evaluation Patient Details Name: Adam Cordova MRN: [id number] DOB: [birth year] Today's Date: 07/29/2021  History of Present Illness  65 y.o. male with medical history significant of Parkinson's disease, dementia, narcolepsy, BPH. Presenting with syncopal episode.  Clinical Impression  Pt admitted with above diagnosis.  Pt oriented only to self. Follows basic one step commands, verbalizes minimally.  Requires +2 assist to stand and transfer to chair d/t UE tremors, decr coordination UEs/LEs and posterior LOB. Per chart wife endorses pt having multiple falls  Pt currently with functional limitations due to the deficits listed below (see PT Problem List). Pt will benefit from skilled PT to increase their independence and safety with mobility to allow discharge to the venue listed below.          Recommendations for follow up therapy are one component of a multi-disciplinary discharge planning process, led by the attending physician.  Recommendations may be updated based on patient status, additional functional criteria and insurance authorization.  Follow Up Recommendations Skilled nursing-short term rehab (<3 hours/day)    Assistance Recommended at Discharge Frequent or constant Supervision/Assistance  Functional Status Assessment Patient has had a recent decline in their functional status and demonstrates the ability to make significant improvements in function in a reasonable and predictable amount of time.  Equipment Recommendations  None recommended by PT    Recommendations for Other Services       Precautions / Restrictions Precautions Precautions: Fall Restrictions Weight Bearing Restrictions: No      Mobility  Bed Mobility Overal bed mobility: Needs Assistance Bed Mobility: Supine to Sit     Supine to sit: Min guard     General bed mobility comments: incr time, min/guard for safety    Transfers Overall transfer level: Needs assistance Equipment  used: Rolling [place] (2 wheels) Transfers: Sit to/from Stand;Bed to chair/wheelchair/BSC Sit to Stand: Mod assist;+2 physical assistance;+2 safety/equipment   Step pivot transfers: Mod assist;+2 safety/equipment;+2 physical assistance       General transfer comment: assist to rise and prevent posterior LOB, to bring COG over BOS. multimodal cues and +2 assist to balance and pivot to chair    Ambulation/Gait                  Stairs            Wheelchair Mobility    Modified Rankin (Stroke Patients Only)       Balance Overall balance assessment: Needs assistance;History of Falls Sitting-balance support: Feet supported;Feet unsupported;No upper extremity supported Sitting balance-Leahy Scale: Fair Sitting balance - Comments: initial posterior bias, pt able to correct with incr time     Standing balance-Leahy Scale: Poor Standing balance comment: reliant on external support                             Pertinent Vitals/Pain Breathing: normal Negative Vocalization: none Facial Expression: smiling or inexpressive Body Language: relaxed Consolability: no need to console PAINAD Score: 0    Home Living Family/patient expects to be discharged to:: Private residence Living Arrangements: Spouse/significant other                      Prior Function Prior Level of Function : Patient poor historian/Family not available                     Hand Dominance        Extremity/Trunk Assessment  Lower Extremity Assessment Lower Extremity Assessment: RLE deficits/detail;LLE deficits/detail RLE Deficits / Details: AAROM grossly WFL, incr flexor tone RLE Coordination: decreased fine motor;decreased gross motor LLE Deficits / Details: AAROM grossly WFL, incr flexor tone, L ankle inverted until WBing LLE Coordination: decreased fine motor;decreased gross motor       Communication   Communication:  (verbalizes very little)   Cognition Arousal/Alertness: Awake/alert Behavior During Therapy: Flat affect Overall Cognitive Status: No family/caregiver present to determine baseline cognitive functioning Area of Impairment: Orientation;Attention;Following commands;Problem solving                 Orientation Level: Disoriented to;Place;Time;Situation Current Attention Level: Focused   Following Commands: Follows one step commands with increased time;Follows multi-step commands inconsistently     Problem Solving: Slow processing;Decreased initiation;Requires verbal cues;Requires tactile cues          General Comments      Exercises     Assessment/Plan    PT Assessment Patient needs continued PT services  PT Problem List Decreased strength;Decreased activity tolerance;Decreased balance;Decreased knowledge of use of DME;Decreased cognition;Decreased mobility       PT Treatment Interventions DME instruction;Therapeutic activities;Gait training;Functional mobility training;Therapeutic exercise;Patient/family education;Balance training    PT Goals (Current goals can be found in the Care Plan section)  Acute Rehab PT Goals PT Goal Formulation: Patient unable to participate in goal setting Time For Goal Achievement: 08/12/21 Potential to Achieve Goals: Fair    Frequency Min 2X/week   Barriers to discharge        Co-evaluation               AM-PAC PT "6 Clicks" Mobility  Outcome Measure Help needed turning from your back to your side while in a flat bed without using bedrails?: A Little Help needed moving from lying on your back to sitting on the side of a flat bed without using bedrails?: A Little Help needed moving to and from a bed to a chair (including a wheelchair)?: Total Help needed standing up from a chair using your arms (e.g., wheelchair or bedside chair)?: Total Help needed to walk in hospital room?: Total Help needed climbing 3-5 steps with a railing? : Total 6 Click Score:  10    End of Session Equipment Utilized During Treatment: Gait belt Activity Tolerance: Patient tolerated treatment well Patient left: in chair;with call bell/phone within reach;with chair alarm set   PT Visit Diagnosis: Other abnormalities of gait and mobility (R26.89);Difficulty in walking, not elsewhere classified (R26.2);Other symptoms and signs involving the nervous system (R29.898)    Time: 7544-9201 PT Time Calculation (min) (ACUTE ONLY): 10 min   Charges:   PT Evaluation $PT Eval Low Complexity: [place], PT  Acute Rehab Dept ([place]) [phone] Pager [phone]  07/29/2021   [facility] 07/29/2021, 2:00 PM

## 2021-07-29 NOTE — NC FL2 (Signed)
[employer] LEVEL OF CARE SCREENING TOOL     IDENTIFICATION  Patient Name: Adam Cordova Birthdate: [birth year] Sex: male Admission Date (Current Location): 07/27/2021  [facility] and [place] Number:  Herbalist and Address:  [facility],  [place] [place], [place]      Provider Number: [id number]  Attending Physician Name and Address:  Eugenie Filler, MD  Relative Name and Phone Number:  Wife - Oluwafemi Villella - # [phone]    Current Level of Care: Hospital Recommended Level of Care: [place] Prior Approval Number:    Date Approved/Denied:   PASRR Number:    Discharge Plan: SNF    Current Diagnoses: Patient Active Problem List   Diagnosis Date Noted   Essential hypertension 07/28/2021   NSVT (nonsustained ventricular tachycardia)    Syncope 07/27/2021   Complex laceration of ear 03/25/2019   Closed displaced fracture of shaft of fourth metacarpal bone of left hand 09/16/2018   Parkinson's disease ([place]) 10/17/2017   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[place]) 10/17/2017   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   BPH with obstruction/lower urinary tract symptoms 07/09/2017   Gait disturbance 09/18/2016   Poor balance 09/18/2016   Depression 06/11/2016   Excessive sleepiness 06/11/2016   Auditory hallucination 06/11/2016   Hallucination, visual 06/11/2016   Metatarsal fracture 03/27/2015   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   Acute urinary retention 11/23/2012   BPH (benign prostatic hyperplasia) 11/23/2012   Enlarged prostate without lower urinary tract symptoms (luts) 11/23/2012   Parkinson disease ([place]) 05/18/2012    Orientation RESPIRATION BLADDER Height & Weight     Self  Normal Continent Weight: 166 lb 7.2 oz (75.5 kg) Height:  5\' 10"  (177.8 cm)  BEHAVIORAL SYMPTOMS/MOOD NEUROLOGICAL BOWEL NUTRITION STATUS      Continent Diet  AMBULATORY  STATUS COMMUNICATION OF NEEDS Skin   Limited Assist Verbally Skin abrasions (Anus;Elbow;Knee;Leg - Right and Left)                       Personal Care Assistance Level of Assistance  Bathing, Dressing Bathing Assistance: Limited assistance   Dressing Assistance: Limited assistance     Functional Limitations Info             SPECIAL CARE FACTORS FREQUENCY  PT (By licensed PT), OT (By licensed OT)     PT Frequency: Minimum of 5 X's per week OT Frequency: Minimum of 5 X's per week            Contractures Contractures Info: Not present    Additional Factors Info  Code Status Code Status Info: DNR             Current Medications (07/29/2021):  This is the current hospital active medication list Current Facility-Administered Medications  Medication Dose Route Frequency Provider Last Rate Last Admin   0.9 %  sodium chloride infusion   Intravenous Continuous Eugenie Filler, MD 100 mL/hr at 07/29/21 1444 New Bag at 07/29/21 1444   carbidopa-levodopa (SINEMET CR) 50-200 MG per tablet controlled release 1 tablet  1 tablet Oral QHS Gwynne Edinger, MD   1 tablet at 07/28/21 2035   carbidopa-levodopa (SINEMET IR) 25-100 MG per tablet immediate release 2 tablet  2 tablet Oral Q24H Kyle, Tyrone A, DO   2 tablet at 07/29/21 0945   carbidopa-levodopa (SINEMET IR) 25-100 MG per tablet immediate release 2.5 tablet  2.5 tablet Oral  2 times per day Cherylann Ratel A, DO   2.5 tablet at 07/29/21 1744   carbidopa-levodopa (SINEMET IR) 25-100 MG per tablet immediate release 3 tablet  3 tablet Oral Q24H Marylyn Ishihara, Tyrone A, DO   3 tablet at 07/29/21 0800   donepezil (ARICEPT) tablet 10 mg  10 mg Oral QHS Kyle, Tyrone A, DO   10 mg at 07/28/21 2036   enoxaparin (LOVENOX) injection 40 mg  40 mg Subcutaneous Daily Gwynne Edinger, MD   40 mg at 07/29/21 0948   influenza vaccine adjuvanted (FLUAD) injection 0.5 mL  0.5 mL Intramuscular Tomorrow-1000 Marylyn Ishihara, Tyrone A, DO       mirabegron ER  (MYRBETRIQ) tablet 25 mg  25 mg Oral Daily Kyle, Tyrone A, DO   25 mg at 07/29/21 0948   QUEtiapine (SEROQUEL) tablet 25 mg  25 mg Oral Daily Kyle, Tyrone A, DO   25 mg at 07/29/21 0948   And   QUEtiapine (SEROQUEL) tablet 50 mg  50 mg Oral QHS Kyle, Tyrone A, DO   50 mg at 07/28/21 2035   sodium chloride flush (NS) 0.9 % injection 3 mL  3 mL Intravenous Q12H Kyle, Tyrone A, DO   3 mL at 07/29/21 0949   tamsulosin (FLOMAX) capsule 0.4 mg  0.4 mg Oral BID Marylyn Ishihara, Tyrone A, DO   0.4 mg at 07/29/21 1747   vitamin B-12 (CYANOCOBALAMIN) tablet 1,000 mcg  1,000 mcg Oral Daily Marylyn Ishihara, Tyrone A, DO   1,000 mcg at 07/29/21 4628     Discharge Medications: Please see discharge summary for a list of discharge medications.  Relevant Imaging Results:  Relevant Lab Results:   Additional Information [employer]  Tenelle Andreason, Marta Lamas, LCSW

## 2021-07-30 ENCOUNTER — Other Ambulatory Visit (HOSPITAL_BASED_OUTPATIENT_CLINIC_OR_DEPARTMENT_OTHER): Payer: Self-pay | Admitting: Cardiology

## 2021-07-30 DIAGNOSIS — G20 Parkinson's disease: Secondary | ICD-10-CM | POA: Diagnosis not present

## 2021-07-30 DIAGNOSIS — I4729 Other ventricular tachycardia: Secondary | ICD-10-CM | POA: Diagnosis not present

## 2021-07-30 DIAGNOSIS — R55 Syncope and collapse: Secondary | ICD-10-CM | POA: Diagnosis not present

## 2021-07-30 DIAGNOSIS — Z515 Encounter for palliative care: Secondary | ICD-10-CM

## 2021-07-30 DIAGNOSIS — Z7189 Other specified counseling: Secondary | ICD-10-CM

## 2021-07-30 LAB — CBC WITH DIFFERENTIAL/PLATELET
Abs Immature Granulocytes: 0.02 10*3/uL (ref 0.00–0.07)
Basophils Absolute: 0 10*3/uL (ref 0.0–0.1)
Basophils Relative: 0 %
Eosinophils Absolute: 0.1 10*3/uL (ref 0.0–0.5)
Eosinophils Relative: 2 %
HCT: 39.8 % (ref 39.0–52.0)
Hemoglobin: 13.5 g/dL (ref 13.0–17.0)
Immature Granulocytes: 0 %
Lymphocytes Relative: 18 %
Lymphs Abs: 1.3 10*3/uL (ref 0.7–4.0)
MCH: 29.1 pg (ref 26.0–34.0)
MCHC: 33.9 g/dL (ref 30.0–36.0)
MCV: 85.8 fL (ref 80.0–100.0)
Monocytes Absolute: 0.8 10*3/uL (ref 0.1–1.0)
Monocytes Relative: 11 %
Neutro Abs: 5.1 10*3/uL (ref 1.7–7.7)
Neutrophils Relative %: 69 %
Platelets: 230 10*3/uL (ref 150–400)
RBC: 4.64 MIL/uL (ref 4.22–5.81)
RDW: 16.1 % — ABNORMAL HIGH (ref 11.5–15.5)
WBC: 7.3 10*3/uL (ref 4.0–10.5)
nRBC: 0 % (ref 0.0–0.2)

## 2021-07-30 LAB — MAGNESIUM: Magnesium: 2 mg/dL (ref 1.7–2.4)

## 2021-07-30 LAB — BASIC METABOLIC PANEL
Anion gap: 5 (ref 5–15)
BUN: 16 mg/dL (ref 8–23)
CO2: 24 mmol/L (ref 22–32)
Calcium: 8.3 mg/dL — ABNORMAL LOW (ref 8.9–10.3)
Chloride: 112 mmol/L — ABNORMAL HIGH (ref 98–111)
Creatinine, Ser: 0.61 mg/dL (ref 0.61–1.24)
GFR, Estimated: >60 mL/min (ref >60)
Glucose, Bld: 89 mg/dL (ref 70–99)
Potassium: 3.7 mmol/L (ref 3.5–5.1)
Sodium: 141 mmol/L (ref 135–145)

## 2021-07-30 LAB — GLUCOSE, CAPILLARY: Glucose-Capillary: 81 mg/dL (ref 70–99)

## 2021-07-30 MED ORDER — POLYETHYLENE GLYCOL 3350 17 G PO PACK: 17.0000 g | PACK | Freq: Every day | ORAL | Status: DC | PRN

## 2021-07-30 MED ORDER — SENNOSIDES-DOCUSATE SODIUM 8.6-50 MG PO TABS
1.0000 | ORAL_TABLET | Freq: Two times a day (BID) | ORAL | Status: DC
  Administered 2021-07-30 – 2021-08-06 (×14): 1 via ORAL
  Filled 2021-07-30 (×14): qty 1

## 2021-07-30 NOTE — Progress Notes (Signed)
Progress Note  Patient Name: Adam Cordova Date of Encounter: 07/30/2021  [facility] Cardiologist: Donato Heinz, MD   Subjective   No acute events overnight. Patient resting comfortably in bed. Awaiting word from social work as to whether he will be able to go to a rehab facility. Discussed event monitor with patient and his wife.  Inpatient Medications    Scheduled Meds:  carbidopa-levodopa  1 tablet Oral QHS   carbidopa-levodopa  2 tablet Oral Q24H   carbidopa-levodopa  2.5 tablet Oral 2 times per day   carbidopa-levodopa  3 tablet Oral Q24H   donepezil  10 mg Oral QHS   enoxaparin (LOVENOX) injection  40 mg Subcutaneous Daily   influenza vaccine adjuvanted  0.5 mL Intramuscular Tomorrow-1000   mirabegron ER  25 mg Oral Daily   QUEtiapine  25 mg Oral Daily   And   QUEtiapine  50 mg Oral QHS   sodium chloride flush  3 mL Intravenous Q12H   tamsulosin  0.4 mg Oral BID   vitamin B-12  1,000 mcg Oral Daily   Continuous Infusions:  sodium chloride 100 mL/hr at 07/29/21 2300   PRN Meds:    Vital Signs    Vitals:   07/29/21 1413 07/29/21 2018 07/30/21 0335 07/30/21 0439  BP: (!) 144/73 (!) 136/91 (!) 146/109   Pulse: 72 66 75   Resp: 15 16 14    Temp:  98.3 F (36.8 C) (!) 97.4 F (36.3 C)   TempSrc:  Oral Oral   SpO2: 98% 96% 96%   Weight:    73.8 kg  Height:        Intake/Output Summary (Last 24 hours) at 07/30/2021 1325 Last data filed at 07/30/2021 0900 Gross per 24 hour  Intake 1166.51 ml  Output --  Net 1166.51 ml   Last 3 Weights 07/30/2021 07/29/2021 07/28/2021  Weight (lbs) 162 lb 11.2 oz 166 lb 7.2 oz 167 lb 1.7 oz  Weight (kg) 73.8 kg 75.5 kg 75.8 kg      Telemetry    NSR - Personally Reviewed  ECG    No new since 11/25 - Personally Reviewed  Physical Exam   GEN: No acute distress.   Neck: No JVD Cardiac: RRR, no murmurs, rubs, or gallops.  Respiratory: Clear to auscultation bilaterally. GI: Soft, nontender,  non-distended  MS: No edema; No deformity. Neuro:  Nonfocal  Psych: Normal affect   Labs    High Sensitivity Troponin:   Recent Labs  Lab 07/27/21 1108 07/27/21 1452  TROPONINIHS 6 5     Chemistry Recent Labs  Lab 07/28/21 0900 07/28/21 0913 07/29/21 0529 07/30/21 0508  NA 140  --  138 141  K 3.8  --  4.4 3.7  CL 109  --  109 112*  CO2 23  --  23 24  GLUCOSE 87  --  77 89  BUN 17  --  16 16  CREATININE 0.72  --  0.69 0.61  CALCIUM 8.8*  --  8.4* 8.3*  MG  --  2.2 2.2 2.0  PROT 6.9  --   --   --   ALBUMIN 4.1  --   --   --   AST 11*  --   --   --   ALT <5  --   --   --   ALKPHOS 72  --   --   --   BILITOT 1.5*  --   --   --   GFRNONAA >  60  --  >60 >60  ANIONGAP 8  --  6 5    Lipids No results for input(s): CHOL, TRIG, HDL, LABVLDL, LDLCALC, CHOLHDL in the last 168 hours.  Hematology Recent Labs  Lab 07/27/21 1108 07/28/21 0900 07/30/21 0601  WBC 10.5 14.6* 7.3  RBC 4.85 5.21 4.64  HGB 13.2 13.9 13.5  HCT 41.4 44.0 39.8  MCV 85.4 84.5 85.8  MCH 27.2 26.7 29.1  MCHC 31.9 31.6 33.9  RDW 15.1 14.9 16.1*  PLT 235 273 230   Thyroid No results for input(s): TSH, FREET4 in the last 168 hours.  BNPNo results for input(s): BNP, PROBNP in the last 168 hours.  DDimer No results for input(s): DDIMER in the last 168 hours.   Radiology    DG CHEST PORT 1 VIEW  Result Date: 07/29/2021 CLINICAL DATA:  Leukocytosis, confusion, Parkinson's EXAM: PORTABLE CHEST 1 VIEW COMPARISON:  Portable exam 1107 hours compared to 07/27/2021 FINDINGS: Upper normal size of cardiac silhouette, likely accentuated by kyphosis. Mediastinal contours and pulmonary vascularity normal. Bibasilar atelectasis. No infiltrate, pleural effusion, or pneumothorax. IMPRESSION: Mild bibasilar atelectasis. Electronically Signed   By: Lavonia Dana M.D.   On: 07/29/2021 13:34    Cardiac Studies   Echo 07/28/21:  1. Left ventricular ejection fraction, by estimation, is 60 to 65%. The  left ventricle has  normal function. Left ventricular endocardial border  not optimally defined to evaluate regional wall motion. There is mild left  ventricular hypertrophy. Left  ventricular diastolic parameters are consistent with Grade I diastolic  dysfunction (impaired relaxation).   2. Right ventricular systolic function is normal. The right ventricular  size is mildly enlarged.   3. Left atrial size was mildly dilated.   4. The mitral valve is grossly normal. No evidence of mitral valve  regurgitation. No evidence of mitral stenosis.   5. The aortic valve is tricuspid. Aortic valve regurgitation is not  visualized. No aortic stenosis is present.   6. The inferior vena cava is normal in size with greater than 50%  respiratory variability, suggesting right atrial pressure of 3 mmHg.   Patient Profile     65 y.o. male with PMH Parkinson's disease, dementia whom we are seeing for syncope  Assessment & Plan    Syncope -suspected 2/2 orthostasis from Parkinson's disease -echo unremarkable  NSVT -echo unremarkable -will arrange for outpatient event monitor, orders sent for 30 day  [facility] will sign off.   Medication Recommendations:  on no cardiac medications Other recommendations (labs, testing, etc):  we will arrange for outpatient event monitor Follow up as an outpatient:  We will arrange for follow up with Dr. Gardiner Rhyme or a member of his team in 6-8 weeks so we will have results of the monitor.  For questions or updates, please contact [place] Please consult [email or web address] for contact info under        Signed, Buford Dresser, MD  07/30/2021, 1:25 PM

## 2021-07-30 NOTE — Consult Note (Signed)
Consultation Note Date: 07/30/2021   Patient Name: Adam Cordova  DOB: 10/08/1955  MRN: [id number]  Age / Sex: 65 y.o., male  PCP: [name], MD Referring Physician: Eugenie Filler, MD  Reason for Consultation: Establishing goals of care  HPI/Patient Profile: 65 y.o. male  with past medical history of Parkinson's disease, dementia  admitted on 07/27/2021 with  syncope.   Clinical Assessment and Goals of Care:  Patient lives at home with his wife who is his primary caregiver, he has been admitted with syncope, cardiology colleagues are following. I met with the patient's wife at bedside, patient resting comfortably, not in any acute distress, discussed with wife outside the room. I introduced myself and palliative care as follows: Palliative medicine is specialized medical care for people living with serious illness. It focuses on providing relief from the symptoms and stress of a serious illness. The goal is to improve quality of life for both the patient and the family. Goals of care: Broad aims of medical therapy in relation to the patient's values and preferences. Our aim is to provide medical care aimed at enabling patients to achieve the goals that matter most to them, given the circumstances of their particular medical situation and their constraints.  Brief life review performed, goals wishes and values important to the patient and family attempted to be explored. Patient has been connected with [facility] for the past 2 and a half years, once monthly visits from palliative staff and sometimes virtual/phone check ins have also taken place.   In today's initial consultation, attempted to discuss further with patient's wife about differences between hospice and palliative, we also talked about her hopes and wishes for her husband's care.encouraged self care, offered opportunity for  reflection on the patient's current condition and his disease process thus far. Offered active listening and supportive presence, see below.   NEXT OF KIN Wife.    SUMMARY OF RECOMMENDATIONS    Agree with DNR Goals of care discussion: SNF rehab with palliative, patient has been followed by [facility] services at home for the past 2 and a half years.  Thank you for the consult.   Code Status/Advance Care Planning: DNR   Symptom Management:     Palliative Prophylaxis:  Delirium Protocol    Psycho-social/Spiritual:  Desire for further Chaplaincy support:yes Additional Recommendations: Caregiving  Support/Resources  Prognosis:  Unable to determine  Discharge Planning:  SNF with palliative.        Primary Diagnoses: Present on Admission:  Syncope  NSVT (nonsustained ventricular tachycardia)  Parkinson's disease ([place])  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HCC)  BPH with obstruction/lower urinary tract symptoms   I have reviewed the medical record, interviewed the patient and family, and examined the patient. The following aspects are pertinent.  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 Wells Guiles Tat   Parkinson's disease dementia [facility])    UTI (urinary tract infection)    Social History   Socioeconomic History   Marital status: Married    Spouse name: Not on file   Number of children: 2   Years of education: Not on file   Highest education level: Not on file  Occupational History   Occupation: retired    Comment: Visual merchandiser  Tobacco Use   Smoking status: Never   Smokeless tobacco: Never  Vaping Use   Vaping Use: Never used  Substance and Sexual Activity   Alcohol use: Yes    Comment: approx 3 glasses  wine per week   Drug use: No   Sexual activity: Not on file  Other Topics Concern   Not on file  Social History Narrative   Right handed   Lives in a two story home with wife and dog   Drinks Caffeine. One cup of coffee in the mornings   Social Determinants of Health   Financial Resource Strain: Not on file  Food Insecurity: Not on file  Transportation Needs: Not on file  Physical Activity: Not on file  Stress: Not on file  Social Connections: Not on file   Family History  Problem Relation Age of Onset   Dementia Mother    Neuropathy Mother    Heart disease Father    Heart disease Brother    Scheduled Meds:  carbidopa-levodopa  1 tablet Oral QHS   carbidopa-levodopa  2 tablet Oral Q24H   carbidopa-levodopa  2.5 tablet Oral 2 times per day   carbidopa-levodopa  3 tablet Oral Q24H   donepezil  10 mg Oral QHS   enoxaparin (LOVENOX) injection  40 mg Subcutaneous Daily   influenza vaccine adjuvanted  0.5 mL Intramuscular Tomorrow-1000   mirabegron ER  25 mg Oral Daily   QUEtiapine  25 mg Oral Daily   And   QUEtiapine  50 mg Oral QHS   sodium chloride flush  3 mL Intravenous Q12H   tamsulosin  0.4 mg Oral BID   vitamin B-12  1,000 mcg Oral Daily   Continuous Infusions:  sodium chloride 100 mL/hr at 07/29/21 2300   PRN Meds:. Medications Prior to Admission:  Prior to Admission medications   Medication Sig Start Date End Date Taking? Authorizing Provider  carbidopa-levodopa (SINEMET CR) 50-200 MG tablet Take 1 tablet by mouth at bedtime. Patient taking differently: Take 1 tablet by mouth at bedtime. Scheduled for 9pm each night 02/27/21  Yes Tat, Eustace Quail, DO  carbidopa-levodopa (SINEMET IR) 25-100 MG tablet 3 in the AM, 2 at 10am, 2 at 2pm, 2 at 6pm Patient taking differently: Take 2-3 tablets by mouth See admin instructions. Takes 3 tablets at 7am, 2 tablets at 10am, 2.5 tablets at 2pm, and 2.5 tablets at 6pm. 02/05/21  Yes Tat, Rebecca S, DO  donepezil (ARICEPT) 10 MG  tablet Take 1 tablet (10 mg total) by mouth at bedtime. 06/22/20  Yes Tat, Eustace Quail, DO  EPINEPHrine 0.3 mg/0.3 mL IJ SOAJ injection Inject 0.3 mg into the muscle as needed for anaphylaxis.   Yes [provider]  MYRBETRIQ 25 MG TB24 tablet Take 25 mg by mouth daily. 12/24/19  Yes [provider]  QUEtiapine (SEROQUEL) 25 MG tablet 1 in the AM, 2 at bed for a week and then 2 po bid as directed Patient taking differently: Take 25-50 mg by  mouth 2 (two) times daily. 20m qam, and 563mqhs 02/27/21  Yes Tat, ReEustace QuailDO  Tamsulosin HCl (FLOMAX) 0.4 MG CAPS Take 0.4 mg by mouth 2 (two) times daily.    Yes [provider]  vitamin B-12 (CYANOCOBALAMIN) 1000 MCG tablet Take 1,000 mcg by mouth daily.   Yes [provider]   Allergies  Allergen Reactions   Bee Venom Anaphylaxis   Levaquin [Levofloxacin Hemihydrate] Anaphylaxis and Swelling   Sertraline Diarrhea and Nausea Only   Review of Systems No pain.   Physical Exam Resting in bed Appears in no distress Appears to have regular work of breathing No edema noted.   Vital Signs: BP (!) 146/109 (BP Location: Left Arm) Comment: patient has tremors  Pulse 75   Temp (!) 97.4 F (36.3 C) (Oral)   Resp 14   Ht '5\' 10"'  (1.778 m)   Wt 73.8 kg   SpO2 96%   BMI 23.34 kg/m  Pain Scale: 0-10 POSS *See Group Information*: S-Acceptable,Sleep, easy to arouse Pain Score: 0-No pain   SpO2: SpO2: 96 % O2 Device:SpO2: 96 % O2 Flow Rate: .   IO: Intake/output summary:  Intake/Output Summary (Last 24 hours) at 07/30/2021 1126 Last data filed at 07/30/2021 0900 Gross per 24 hour  Intake 1166.51 ml  Output --  Net 1166.51 ml    LBM: Last BM Date: 07/29/21 Baseline Weight: Weight: 75.7 kg Most recent weight: Weight: 73.8 kg     Palliative Assessment/Data:   PPS 50%  Time In:  12 Time Out:  1300 Time Total:  60  Greater than 50%  of this time was spent counseling and coordinating care related to the  above assessment and plan.  Signed by: ZeLoistine ChanceMD   Please contact Palliative Medicine Team phone at [phone] questions and concerns.  For individual provider: See AmShea Evans

## 2021-07-30 NOTE — Progress Notes (Signed)
PROGRESS NOTE    Adam Cordova  BHA:[id number] DOB: May 28, 1956 DOA: 07/27/2021 PCP: Adam Arabian, MD    Chief Complaint  Patient presents with   Loss of Consciousness    Brief Narrative:  From admission h and p Adam Cordova is a 65 y.o. male with medical history significant of Parkinson's disease, dementia, narcolepsy, BPH. Presenting with syncopal episode. History from wife at bedside. He was on his couch this morning. He got up and moved to the kitchen. As he entered the kitchen, he collapsed onto his wife. She was able to catch him and rest him on a stool. He laid his head on her shoulder and "was out" for at least 5 minutes. There was no tonic-clonic movement, tongue swallowing, foaming at the mouth, or incontinence. When he came to, he was a little groggy. EMS was called.    His wife reports that last night he had a couple of runs of severe full body tremors. He's never had these events before. She denies any other aggravating or alleviating factors.     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 -Syncope was noted to be witnessed, no signs of seizures noted.  No trauma noted. -CT head negative. -Telemetry did show some nonsustained V. tach being followed by cardiology. -Flomax and anti-Parkinson's agents and Aricept could likely be contributing however wife does not feel that patient can go without his medications at this time. -DC EEG. -TED hose have been ordered however not placed yet, RN notified. -Continue IV fluids. -Patient seen in consultation by cardiology who feel syncope  likely in the setting of Parkinson's disease, no structural heart disease noted on echocardiogram. -Repeat orthostatics in the morning. -Supportive care.  2.  Nonsustained V. tach -Noted on telemetry. -Patient seen in consultation by cardiology who noted prolonged episodes being called V. tach on monitor that appears at effect as patient's QRS marches through episodes per cardiology. -Cardiology did note that patient had a short episode of NSVT lasting a few beats on the monitor in the ED, 2D echo with no structural heart disease noted. -Cardiology recommending to keep potassium greater than 4, magnesium greater than 2 and plan for monitor on discharge. -Appreciate cardiology input and recommendations.  3.  Parkinson's disease/Parkinson's dementia -Per wife has been progressive and worsening. -Being followed by neurology in the outpatient setting -Per wife palliative care was followed in the outpatient setting however has not been seen by palliative care in several weeks and wife interested in palliative care consultation during this hospitalization. -Wife does not feel she can care for patient safely at home at this time as she feels Parkinson's disease has progressed. -Continue current regimen of Sinemet, Aricept, Seroquel. -TOC consulted for placement. -Patient seen by palliative care and I recommended SNF with palliative care following.   4.  BPH -Continue Myrbetriq, Flomax. -It is noted per Dr.Wouk, that wife use a Flomax is discontinued patient will retain urine. -Continue Flomax. -Follow-up.  5.  Falls -Secondary to problem #3. -PT/OT.  6.  Leukocytosis -Likely reactive leukocytosis. -Urinalysis done negative for UTI. -Chest x-ray done negative for any acute infiltrate. -Patient afebrile.   -Leukocytosis trended down.   -  Follow-up.     DVT prophylaxis: Lovenox Code Status: DNR Family Communication: Updated wife at bedside. Disposition:   Status is:  Inpatient  Remains inpatient appropriate because: Severity of illness/unsafe disposition.       Consultants:  Cardiology: Dr. Gardiner Adam Cordova 07/28/2021 Palliative care: Dr. Rowe Cordova 07/30/2021  Procedures:  CT head 07/27/2021 Chest x-ray 07/29/2021, 07/27/2021 2D echo 07/28/2021  Antimicrobials:  None   Subjective: Laying in bed sleeping opens eyes to verbal stimuli.  Denies any chest pain.  No shortness of breath.  No abdominal pain.  Wife at bedside.    Objective: Vitals:   07/29/21 1413 07/29/21 2018 07/30/21 0335 07/30/21 0439  BP: (!) 144/73 (!) 136/91 (!) 146/109   Pulse: 72 66 75   Resp: 15 16 14    Temp:  98.3 F (36.8 C) (!) 97.4 F (36.3 C)   TempSrc:  Oral Oral   SpO2: 98% 96% 96%   Weight:    73.8 kg  Height:        Intake/Output Summary (Last 24 hours) at 07/30/2021 1340 Last data filed at 07/30/2021 0900 Gross per 24 hour  Intake 1166.51 ml  Output --  Net 1166.51 ml    Filed Weights   07/28/21 0611 07/29/21 0500 07/30/21 0439  Weight: 75.8 kg 75.5 kg 73.8 kg    Examination:  General exam: NAD. Respiratory system: CTA B anterior lung fields.  No wheezes, no crackles, no rhonchi.  Normal respiratory effort. Cardiovascular system: Regular rate and rhythm no murmurs rubs or gallops.  No JVD.  No lower extremity edema. Gastrointestinal system: Abdomen is soft, nontender, nondistended, positive bowel sounds.  No rebound.  No guarding.  Central nervous system: Opens eyes to verbal stimuli.  Moving extremities spontaneously.  No focal neurological deficits. Extremities: Symmetric 5 x 5 power. Skin: No rashes, lesions or ulcers Psychiatry: Judgement and insight unable to assess. Mood & affect appropriate.     Data Reviewed: I have personally reviewed following labs and imaging studies  CBC: Recent Labs  Lab 07/27/21 1108 07/28/21 0900 07/30/21 0601  WBC 10.5 14.6* 7.3  NEUTROABS 8.1*  --  5.1  HGB 13.2 13.9 13.5  HCT 41.4 44.0 39.8  MCV 85.4  84.5 85.8  PLT 235 273 230     Basic Metabolic Panel: Recent Labs  Lab 07/27/21 1108 07/28/21 0900 07/28/21 0913 07/29/21 0529 07/30/21 0508  NA 137 140  --  138 141  K 4.5 3.8  --  4.4 3.7  CL 109 109  --  109 112*  CO2 21* 23  --  23 24  GLUCOSE 76 87  --  77 89  BUN 17 17  --  16 16  CREATININE 0.78 0.72  --  0.69 0.61  CALCIUM 8.3* 8.8*  --  8.4* 8.3*  MG  --   --  2.2 2.2 2.0     GFR: Estimated Creatinine Clearance: 92.5 mL/min (by C-G formula based on SCr of 0.61 mg/dL).  Liver Function Tests: Recent Labs  Lab 07/28/21 0900  AST 11*  ALT <5  ALKPHOS 72  BILITOT 1.5*  PROT 6.9  ALBUMIN 4.1     CBG: Recent Labs  Lab 07/28/21 0526 07/29/21 0405 07/29/21 1245 07/30/21 0438  GLUCAP 86 88 100* 81      Recent Results (from the past 240 hour(s))  Resp Panel by RT-PCR (Flu A&B, Covid) Nasopharyngeal Swab     Status: None   Collection Time: 07/27/21 11:31 AM   Specimen: Nasopharyngeal Swab;  Nasopharyngeal(NP) swabs in vial transport medium  Result Value Ref Range Status   SARS Coronavirus 2 by RT PCR NEGATIVE NEGATIVE Final    Comment: (NOTE) SARS-CoV-2 target nucleic acids are NOT DETECTED.  The SARS-CoV-2 RNA is generally detectable in upper respiratory specimens during the acute phase of infection. The lowest concentration of SARS-CoV-2 viral copies this assay can detect is 138 copies/mL. A negative result does not preclude SARS-Cov-2 infection and should not be used as the sole basis for treatment or other patient management decisions. A negative result may occur with  improper specimen collection/handling, submission of specimen other than nasopharyngeal swab, presence of viral mutation(s) within the areas targeted by this assay, and inadequate number of viral copies(<138 copies/mL). A negative result must be combined with clinical observations, patient history, and epidemiological information. The expected result is Negative.  Fact Sheet  for Patients:  EntrepreneurPulse.com.au  Fact Sheet for Healthcare Providers:  IncredibleEmployment.be  This test is no t yet approved or cleared by the [place] FDA and  has been authorized for detection and/or diagnosis of SARS-CoV-2 by FDA under an Emergency Use Authorization (EUA). This EUA will remain  in effect (meaning this test can be used) for the duration of the COVID-19 declaration under Section 564(b)(1) of the Act, 21 U.S.C.section 360bbb-3(b)(1), unless the authorization is terminated  or revoked sooner.       Influenza A by PCR NEGATIVE NEGATIVE Final   Influenza B by PCR NEGATIVE NEGATIVE Final    Comment: (NOTE) The Xpert Xpress SARS-CoV-2/FLU/RSV plus assay is intended as an aid in the diagnosis of influenza from Nasopharyngeal swab specimens and should not be used as a sole basis for treatment. Nasal washings and aspirates are unacceptable for Xpert Xpress SARS-CoV-2/FLU/RSV testing.  Fact Sheet for Patients: EntrepreneurPulse.com.au  Fact Sheet for Healthcare Providers: IncredibleEmployment.be  This test is not yet approved or cleared by the [place] FDA and has been authorized for detection and/or diagnosis of SARS-CoV-2 by FDA under an Emergency Use Authorization (EUA). This EUA will remain in effect (meaning this test can be used) for the duration of the COVID-19 declaration under Section 564(b)(1) of the Act, 21 U.S.C. section 360bbb-3(b)(1), unless the authorization is terminated or revoked.  Performed at [facility], [place] [address], [place], [place] [address]           Radiology Studies: DG CHEST PORT 1 VIEW  Result Date: 07/29/2021 CLINICAL DATA:  Leukocytosis, confusion, Parkinson's EXAM: PORTABLE CHEST 1 VIEW COMPARISON:  Portable exam 1107 hours compared to 07/27/2021 FINDINGS: Upper normal size of cardiac silhouette, likely accentuated  by kyphosis. Mediastinal contours and pulmonary vascularity normal. Bibasilar atelectasis. No infiltrate, pleural effusion, or pneumothorax. IMPRESSION: Mild bibasilar atelectasis. Electronically Signed   By: Lavonia Dana M.D.   On: 07/29/2021 13:34        Scheduled Meds:  carbidopa-levodopa  1 tablet Oral QHS   carbidopa-levodopa  2 tablet Oral Q24H   carbidopa-levodopa  2.5 tablet Oral 2 times per day   carbidopa-levodopa  3 tablet Oral Q24H   donepezil  10 mg Oral QHS   enoxaparin (LOVENOX) injection  40 mg Subcutaneous Daily   influenza vaccine adjuvanted  0.5 mL Intramuscular Tomorrow-1000   mirabegron ER  25 mg Oral Daily   QUEtiapine  25 mg Oral Daily   And   QUEtiapine  50 mg Oral QHS   sodium chloride flush  3 mL Intravenous Q12H   tamsulosin  0.4 mg Oral BID  vitamin B-12  1,000 mcg Oral Daily   Continuous Infusions:  sodium chloride 100 mL/hr at 07/30/21 1331     LOS: 2 days    Time spent: 40 minutes    Irine Seal, MD [facility]   To contact the attending provider between 7A-7P or the covering provider during after hours 7P-7A, please log into the web site [email or web address] and access using universal [place] password for that web site. If you do not have the password, please call the hospital operator.  07/30/2021, 1:40 PM

## 2021-07-31 LAB — CBC
HCT: 42.6 % (ref 39.0–52.0)
Hemoglobin: 13.8 g/dL (ref 13.0–17.0)
MCH: 27.2 pg (ref 26.0–34.0)
MCHC: 32.4 g/dL (ref 30.0–36.0)
MCV: 84 fL (ref 80.0–100.0)
Platelets: 242 10*3/uL (ref 150–400)
RBC: 5.07 MIL/uL (ref 4.22–5.81)
RDW: 14.6 % (ref 11.5–15.5)
WBC: 11.9 10*3/uL — ABNORMAL HIGH (ref 4.0–10.5)
nRBC: 0 % (ref 0.0–0.2)

## 2021-07-31 LAB — BASIC METABOLIC PANEL
Anion gap: 7 (ref 5–15)
BUN: 12 mg/dL (ref 8–23)
CO2: 20 mmol/L — ABNORMAL LOW (ref 22–32)
Calcium: 8.6 mg/dL — ABNORMAL LOW (ref 8.9–10.3)
Chloride: 111 mmol/L (ref 98–111)
Creatinine, Ser: 0.79 mg/dL (ref 0.61–1.24)
GFR, Estimated: >60 mL/min (ref >60)
Glucose, Bld: 91 mg/dL (ref 70–99)
Potassium: 4.1 mmol/L (ref 3.5–5.1)
Sodium: 138 mmol/L (ref 135–145)

## 2021-07-31 LAB — GLUCOSE, CAPILLARY: Glucose-Capillary: 90 mg/dL (ref 70–99)

## 2021-07-31 LAB — MAGNESIUM: Magnesium: 2 mg/dL (ref 1.7–2.4)

## 2021-07-31 NOTE — TOC Progression Note (Signed)
Transition of Care [facility]) - Progression Note    Patient Details  Name: ALP GOLDWATER MRN: [id number] Date of Birth: [birth year]  Transition of Care [facility]) CM/SW [place], [place] Phone Number: 07/31/2021, 3:04 PM  Clinical Narrative:   Spoke with wife, confirmed plan of LTC at skilled facility.  No MCD nor LTC insurance.  She plans to pay out of pocket for care. TOC will continue to follow during the course of hospitalization.     Expected Discharge Plan: [place] Barriers to Discharge: Continued Medical Work up, SNF Pending bed offer  Expected Discharge Plan and Services Expected Discharge Plan: [place] In-house Referral: Clinical Social Work   Post Acute Care Choice: [place] Living arrangements for the past 2 months: Single Family Home                                       Social Determinants of Health (SDOH) Interventions    Readmission Risk Interventions No flowsheet data found.

## 2021-07-31 NOTE — Progress Notes (Signed)
Palliative care brief progress note  Discussed with TOC and they are exploring options for placement when he discharges from the hospital.  I met briefly today with Adam Cordova and his wife at the bedside.  OT was working with them at time of my encounter.  I briefly introduced myself and his wife did not have any further questions or specific needs for our team at this time.  He is followed by [facility] as an outpatient.  I do recommend he continue to be followed by outpatient palliative care at time of discharge.  Please call if there are other palliative specific needs with which we can be of assistance.  Micheline Rough, MD [place] Team [phone]  No charge note

## 2021-07-31 NOTE — Plan of Care (Signed)

## 2021-07-31 NOTE — Progress Notes (Signed)
PROGRESS NOTE    Adam Cordova  VOZ:[id number] DOB: 02/16/1956 DOA: 07/27/2021 PCP: [name], MD    Chief Complaint  Patient presents with   Loss of Consciousness    Brief Narrative:  From admission h and p Adam Cordova is a 65 y.o. male with medical history significant of Parkinson's disease, dementia, narcolepsy, BPH. Presenting with syncopal episode. History from wife at bedside. He was on his couch this morning. He got up and moved to the kitchen. As he entered the kitchen, he collapsed onto his wife. She was able to catch him and rest him on a stool. He laid his head on her shoulder and "was out" for at least 5 minutes. There was no tonic-clonic movement, tongue swallowing, foaming at the mouth, or incontinence. When he came to, he was a little groggy. EMS was called.    His wife reports that last night he had a couple of runs of severe full body tremors. He's never had these events before. She denies any other aggravating or alleviating factors.     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 -Syncope was noted to be witnessed, no signs of seizures noted.  No trauma noted. -CT head negative. -Telemetry did show some nonsustained V. tach being followed by cardiology. -Flomax and anti-Parkinson's agents and Aricept could likely be contributing however wife does not feel that patient can go without his medications at this time. -DC EEG. -TED hose placed.   -Patient hydrated with IV fluids.   -Discontinue IV fluids.   -Orthostasis with some improvement. -Patient seen in consultation  by cardiology who feel syncope likely in the setting of Parkinson's disease, no structural heart disease noted on echocardiogram. -Supportive care.  2.  Nonsustained V. tach -Noted on telemetry. -Patient seen in consultation by cardiology who noted prolonged episodes being called V. tach on monitor that appears at effect as patient's QRS marches through episodes per cardiology. -Cardiology did note that patient had a short episode of NSVT lasting a few beats on the monitor in the ED, 2D echo with no structural heart disease noted. -Cardiology recommending to keep potassium greater than 4, magnesium greater than 2 and plan for monitor on discharge. -Appreciate cardiology input and recommendations.  3.  Parkinson's disease/Parkinson's dementia -Per wife has been progressive and worsening. -Being followed by neurology in the outpatient setting -Per wife palliative care was followed in the outpatient setting however has not been seen by palliative care in several weeks and wife interested in palliative care consultation during this hospitalization. -Wife does not feel she can care for patient safely at home at this time as she feels Parkinson's disease has progressed. -Continue current regimen of Sinemet, Aricept, Seroquel. -TOC consulted for placement. -Patient seen by palliative care and I recommended SNF with palliative care following.   4.  BPH -Continue Myrbetriq, Flomax. -It is noted per Dr.Wouk, that wife stated if Flomax is discontinued patient will retain urine. -Continue Flomax. -Follow-up.  5.  Falls -Secondary to problem #3. -PT/OT. -We will need placement with palliative care following.  6.  Leukocytosis -Likely reactive leukocytosis. -Urinalysis done negative for UTI. -Chest x-ray done negative for  any acute infiltrate. -Patient afebrile.   -Leukocytosis trended down.      DVT prophylaxis: Lovenox Code Status: DNR Family Communication: No family at  bedside Disposition:   Status is: Inpatient  Remains inpatient appropriate because: Severity of illness/unsafe disposition.       Consultants:  Cardiology: Dr. Gardiner Rhyme 07/28/2021 Palliative care: Dr. Rowe Pavy 07/30/2021  Procedures:  CT head 07/27/2021 Chest x-ray 07/29/2021, 07/27/2021 2D echo 07/28/2021  Antimicrobials:  None   Subjective: Laying in bed.  Awake.  Not answering questions.  Nurse tech at bedside attempting to feed patient.    Objective: Vitals:   07/31/21 0817 07/31/21 0821 07/31/21 0825 07/31/21 1312  BP: (!) 142/98 (!) 126/96 (!) 141/89 136/89  Pulse: 79 83 83 62  Resp:      Temp:    98.1 F (36.7 C)  TempSrc:    Oral  SpO2:   100% 100%  Weight:      Height:        Intake/Output Summary (Last 24 hours) at 07/31/2021 1757 Last data filed at 07/31/2021 1300 Gross per 24 hour  Intake 450 ml  Output 1225 ml  Net -775 ml    Filed Weights   07/29/21 0500 07/30/21 0439 07/31/21 0500  Weight: 75.5 kg 73.8 kg 75.8 kg    Examination:  General exam: NAD. Respiratory system: Lungs clear to auscultation bilaterally anterior lung fields.  No wheezes, no crackles, no rhonchi.  Normal respiratory effort Cardiovascular system: RRR no murmurs rubs or gallops.  No JVD.  No lower extremity edema.  Gastrointestinal system: Abdomen is soft, nontender, nondistended, positive bowel sounds.  No rebound.  No guarding.  Central nervous system: Awake.  Alert.  Moving extremities spontaneously. Extremities: Symmetric 5 x 5 power. Skin: No rashes, lesions or ulcers Psychiatry: Judgement and insight poor. Mood & affect appropriate.     Data Reviewed: I have personally reviewed following labs and imaging studies  CBC: Recent Labs  Lab 07/27/21 1108 07/28/21 0900 07/30/21 0601 07/31/21 0500  WBC 10.5 14.6* 7.3 11.9*  NEUTROABS 8.1*  --  5.1  --   HGB 13.2 13.9 13.5 13.8  HCT 41.4 44.0 39.8 42.6  MCV 85.4 84.5 85.8 84.0  PLT 235 273 230 242      Basic Metabolic Panel: Recent Labs  Lab 07/27/21 1108 07/28/21 0900 07/28/21 0913 07/29/21 0529 07/30/21 0508 07/31/21 0500  NA 137 140  --  138 141 138  K 4.5 3.8  --  4.4 3.7 4.1  CL 109 109  --  109 112* 111  CO2 21* 23  --  23 24 20*  GLUCOSE 76 87  --  77 89 91  BUN 17 17  --  16 16 12   CREATININE 0.78 0.72  --  0.69 0.61 0.79  CALCIUM 8.3* 8.8*  --  8.4* 8.3* 8.6*  MG  --   --  2.2 2.2 2.0 2.0     GFR: Estimated Creatinine Clearance: 92.5 mL/min (by C-G formula based on SCr of 0.79 mg/dL).  Liver Function Tests: Recent Labs  Lab 07/28/21 0900  AST 11*  ALT <5  ALKPHOS 72  BILITOT 1.5*  PROT 6.9  ALBUMIN 4.1     CBG: Recent Labs  Lab 07/28/21 0526 07/29/21 0405 07/29/21 1245 07/30/21 0438 07/31/21 0642  GLUCAP 86 88 100* 81 90      Recent Results (from the past 240 hour(s))  Resp Panel by RT-PCR (Flu A&B, Covid) Nasopharyngeal Swab     Status:  None   Collection Time: 07/27/21 11:31 AM   Specimen: Nasopharyngeal Swab; Nasopharyngeal(NP) swabs in vial transport medium  Result Value Ref Range Status   SARS Coronavirus 2 by RT PCR NEGATIVE NEGATIVE Final    Comment: (NOTE) SARS-CoV-2 target nucleic acids are NOT DETECTED.  The SARS-CoV-2 RNA is generally detectable in upper respiratory specimens during the acute phase of infection. The lowest concentration of SARS-CoV-2 viral copies this assay can detect is 138 copies/mL. A negative result does not preclude SARS-Cov-2 infection and should not be used as the sole basis for treatment or other patient management decisions. A negative result may occur with  improper specimen collection/handling, submission of specimen other than nasopharyngeal swab, presence of viral mutation(s) within the areas targeted by this assay, and inadequate number of viral copies(<138 copies/mL). A negative result must be combined with clinical observations, patient history, and epidemiological information. The  expected result is Negative.  Fact Sheet for Patients:  EntrepreneurPulse.com.au  Fact Sheet for Healthcare Providers:  IncredibleEmployment.be  This test is no t yet approved or cleared by the [place] FDA and  has been authorized for detection and/or diagnosis of SARS-CoV-2 by FDA under an Emergency Use Authorization (EUA). This EUA will remain  in effect (meaning this test can be used) for the duration of the COVID-19 declaration under Section 564(b)(1) of the Act, 21 U.S.C.section 360bbb-3(b)(1), unless the authorization is terminated  or revoked sooner.       Influenza A by PCR NEGATIVE NEGATIVE Final   Influenza B by PCR NEGATIVE NEGATIVE Final    Comment: (NOTE) The Xpert Xpress SARS-CoV-2/FLU/RSV plus assay is intended as an aid in the diagnosis of influenza from Nasopharyngeal swab specimens and should not be used as a sole basis for treatment. Nasal washings and aspirates are unacceptable for Xpert Xpress SARS-CoV-2/FLU/RSV testing.  Fact Sheet for Patients: EntrepreneurPulse.com.au  Fact Sheet for Healthcare Providers: IncredibleEmployment.be  This test is not yet approved or cleared by the [place] FDA and has been authorized for detection and/or diagnosis of SARS-CoV-2 by FDA under an Emergency Use Authorization (EUA). This EUA will remain in effect (meaning this test can be used) for the duration of the COVID-19 declaration under Section 564(b)(1) of the Act, 21 U.S.C. section 360bbb-3(b)(1), unless the authorization is terminated or revoked.  Performed at [facility], [place] [address] [place], [place] [address]           Radiology Studies: No results found.      Scheduled Meds:  carbidopa-levodopa  1 tablet Oral QHS   carbidopa-levodopa  2 tablet Oral Q24H   carbidopa-levodopa  2.5 tablet Oral 2 times per day   carbidopa-levodopa  3 tablet  Oral Q24H   donepezil  10 mg Oral QHS   enoxaparin (LOVENOX) injection  40 mg Subcutaneous Daily   influenza vaccine adjuvanted  0.5 mL Intramuscular Tomorrow-1000   mirabegron ER  25 mg Oral Daily   QUEtiapine  25 mg Oral Daily   And   QUEtiapine  50 mg Oral QHS   senna-docusate  1 tablet Oral BID   sodium chloride flush  3 mL Intravenous Q12H   tamsulosin  0.4 mg Oral BID   vitamin B-12  1,000 mcg Oral Daily   Continuous Infusions:     LOS: 3 days    Time spent: 40 minutes    Irine Seal, MD [facility]   To contact the attending provider between 7A-7P or the covering provider during after hours 7P-7A,  please log into the web site [email or web address] and access using universal [place] password for that web site. If you do not have the password, please call the hospital operator.  07/31/2021, 5:57 PM

## 2021-07-31 NOTE — NC FL2 (Signed)
[place] LEVEL OF CARE SCREENING TOOL     IDENTIFICATION  Patient Name: Adam Cordova Birthdate: 29-Jan-1956 Sex: male Admission Date (Current Location): 07/27/2021  [facility] and [place] Number:  Herbalist and Address:  [facility],  [address] [place], [place]      Provider Number: [id number]  Attending Physician Name and Address:  Eugenie Filler, MD  Relative Name and Phone Number:  Wife - Tadan Shill - # [phone]    Current Level of Care: Hospital Recommended Level of Care: [place] Prior Approval Number:    Date Approved/Denied:   PASRR Number: pending  Discharge Plan: SNF    Current Diagnoses: Patient Active Problem List   Diagnosis Date Noted   Leukocytosis    Essential hypertension 07/28/2021   NSVT (nonsustained ventricular tachycardia)    Syncope 07/27/2021   Complex laceration of ear 03/25/2019   Closed displaced fracture of shaft of fourth metacarpal bone of left hand 09/16/2018   Parkinson's disease ([place]) 10/17/2017   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[place]) 10/17/2017   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   BPH with obstruction/lower urinary tract symptoms 07/09/2017   Gait disturbance 09/18/2016   Poor balance 09/18/2016   Depression 06/11/2016   Excessive sleepiness 06/11/2016   Auditory hallucination 06/11/2016   Hallucination, visual 06/11/2016   Metatarsal fracture 03/27/2015   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   Acute urinary retention 11/23/2012   BPH (benign prostatic hyperplasia) 11/23/2012   Enlarged prostate without lower urinary tract symptoms (luts) 11/23/2012   Parkinson disease ([place]) 05/18/2012    Orientation RESPIRATION BLADDER Height & Weight     Self  Normal Incontinent Weight: 75.8 kg Height:  5\' 10"  (177.8 cm)  BEHAVIORAL SYMPTOMS/MOOD NEUROLOGICAL BOWEL NUTRITION STATUS   (none)   Incontinent   (see d/c summary)  AMBULATORY STATUS COMMUNICATION OF NEEDS Skin   Total Care  (Generally one word responses) Skin abrasions (Anus;Elbow;Knee;Leg - Right and Left)                       Personal Care Assistance Level of Assistance  Bathing, Total care, Feeding Bathing Assistance: Maximum assistance Feeding assistance: Limited assistance Dressing Assistance: Maximum assistance Total Care Assistance: Maximum assistance   Functional Limitations Info  Sight, Hearing, Speech Sight Info: Adequate Hearing Info: Adequate Speech Info: Impaired    SPECIAL CARE FACTORS FREQUENCY                    Contractures Contractures Info: Not present    Additional Factors Info  Allergies Code Status Info: DNR Allergies Info: Bee venom, Levaquin, Sertraline           Current Medications (07/31/2021):  This is the current hospital active medication list Current Facility-Administered Medications  Medication Dose Route Frequency Provider Last Rate Last Admin   0.9 %  sodium chloride infusion   Intravenous Continuous Eugenie Filler, MD 100 mL/hr at 07/30/21 2300 New Bag at 07/30/21 2300   carbidopa-levodopa (SINEMET CR) 50-200 MG per tablet controlled release 1 tablet  1 tablet Oral QHS Gwynne Edinger, MD   1 tablet at 07/30/21 2108   carbidopa-levodopa (SINEMET IR) 25-100 MG per tablet immediate release 2 tablet  2 tablet Oral Q24H Kyle, Tyrone A, DO   2 tablet at 07/31/21 1053   carbidopa-levodopa (SINEMET IR) 25-100 MG per tablet immediate release 2.5 tablet  2.5 tablet Oral 2  times per day Cherylann Ratel A, DO   2.5 tablet at 07/30/21 1729   carbidopa-levodopa (SINEMET IR) 25-100 MG per tablet immediate release 3 tablet  3 tablet Oral Q24H Marylyn Ishihara, Tyrone A, DO   3 tablet at 07/31/21 0643   donepezil (ARICEPT) tablet 10 mg  10 mg Oral QHS Kyle, Tyrone A, DO   10 mg at 07/30/21 2108   enoxaparin (LOVENOX) injection 40 mg  40 mg Subcutaneous Daily Gwynne Edinger, MD   40 mg at  07/31/21 1057   influenza vaccine adjuvanted (FLUAD) injection 0.5 mL  0.5 mL Intramuscular Tomorrow-1000 Marylyn Ishihara, Tyrone A, DO       mirabegron ER (MYRBETRIQ) tablet 25 mg  25 mg Oral Daily Kyle, Tyrone A, DO   25 mg at 07/30/21 1037   polyethylene glycol (MIRALAX / GLYCOLAX) packet 17 g  17 g Oral Daily PRN Eugenie Filler, MD       QUEtiapine (SEROQUEL) tablet 25 mg  25 mg Oral Daily Kyle, Tyrone A, DO   25 mg at 07/31/21 1053   And   QUEtiapine (SEROQUEL) tablet 50 mg  50 mg Oral QHS Kyle, Tyrone A, DO   50 mg at 07/30/21 2108   senna-docusate (Senokot-S) tablet 1 tablet  1 tablet Oral BID Eugenie Filler, MD   1 tablet at 07/31/21 1053   sodium chloride flush (NS) 0.9 % injection 3 mL  3 mL Intravenous Q12H Kyle, Tyrone A, DO   3 mL at 07/31/21 1056   tamsulosin (FLOMAX) capsule 0.4 mg  0.4 mg Oral BID Marylyn Ishihara, Tyrone A, DO   0.4 mg at 07/31/21 1054   vitamin B-12 (CYANOCOBALAMIN) tablet 1,000 mcg  1,000 mcg Oral Daily Kyle, Tyrone A, DO   1,000 mcg at 07/31/21 1054     Discharge Medications: Please see discharge summary for a list of discharge medications.  Relevant Imaging Results:  Relevant Lab Results:   Additional Information [place], [place]

## 2021-07-31 NOTE — Evaluation (Signed)
Occupational Therapy Evaluation Patient Details Name: Adam Cordova MRN: [id number] DOB: June 21, 1956 Today's Date: 07/31/2021   History of Present Illness 65 y.o. male with medical history significant of Parkinson's disease, dementia, narcolepsy, BPH. Presenting with syncopal episode.   Clinical Impression   Patient 65 year old male who was noted to have had a functional decline in participation in ADLs. Patient was previously living at home with wife support. Patient currently is mod A for bed mobility with min A to maintain sitting balance on edge of bed. Patient is min A for oral care, max A for bathing/dressing and TD for transfers. Patient could benefit from skilled OT services to increased independence in ADLs and increase safety. Patient would continue to benefit from skilled OT services at this time while admitted and after d/c to address noted deficits in order to improve overall safety and independence in ADLs.       Recommendations for follow up therapy are one component of a multi-disciplinary discharge planning process, led by the attending physician.  Recommendations may be updated based on patient status, additional functional criteria and insurance authorization.   Follow Up Recommendations  Skilled nursing-short term rehab (<3 hours/day)    Assistance Recommended at Discharge Frequent or constant Supervision/Assistance  Functional Status Assessment  Patient has had a recent decline in their functional status and demonstrates the ability to make significant improvements in function in a reasonable and predictable amount of time.  Equipment Recommendations  None recommended by OT    Recommendations for Other Services       Precautions / Restrictions Precautions Precautions: Fall Precaution Comments: tremours in RUE Restrictions Weight Bearing Restrictions: No      Mobility Bed Mobility Overal bed mobility: Needs Assistance Bed Mobility: Supine to Sit;Sit to  Supine     Supine to sit: Mod assist Sit to supine: Max assist   General bed mobility comments: increased time for supine to sit on edge of bed with education for proper hand and foot placement. physical assistance for bringing BLE to edge of bed. patient needed physical assistance for sit to supine for BLE and trunk postioning    Transfers                          Balance Overall balance assessment: Needs assistance;History of Falls Sitting-balance support: Feet supported;Feet unsupported;No upper extremity supported Sitting balance-Leahy Scale: Fair Sitting balance - Comments: patient was noted to have posterior leaning during session with cues verbal and tactile to come forwards to maintain sitting balance.       Standing balance comment: deferred at this time                           ADL either performed or assessed with clinical judgement   ADL Overall ADL's : Needs assistance/impaired Eating/Feeding: Maximal assistance;Bed level   Grooming: Minimal assistance;Sitting;Oral care Grooming Details (indicate cue type and reason): EOB. patient needed sequencing cues for task but able to participate. Upper Body Bathing: Moderate assistance;Bed level   Lower Body Bathing: Bed level;Maximal assistance   Upper Body Dressing : Moderate assistance;Bed level   Lower Body Dressing: Bed level;Maximal assistance   Toilet Transfer: +2 for physical assistance;+2 for safety/equipment;Total assistance   Toileting- Clothing Manipulation and Hygiene: Total assistance;Bed level       Functional mobility during ADLs: +2 for safety/equipment;+2 for physical assistance       Vision Baseline Vision/History:  1 Wears glasses Ability to See in Adequate Light: 1 Impaired Patient Visual Report: No change from baseline Additional Comments: hard to assess with cong impairments.     Perception     Praxis      Pertinent Vitals/Pain Pain Assessment: No/denies pain      Hand Dominance Right   Extremity/Trunk Assessment Upper Extremity Assessment Upper Extremity Assessment: RUE deficits/detail RUE Deficits / Details: patient noted to have ROM WFL. patient noted to have resting tremors especially when parkinsons medication wears off. difficult to fully assess MMT formally with patient having difficulty processing cues.       Cervical / Trunk Assessment Cervical / Trunk Assessment: Normal   Communication Communication Communication: Other (comment) (verbalizes some during session. most nonsensical.)   Cognition Arousal/Alertness: Awake/alert Behavior During Therapy: Flat affect Overall Cognitive Status: Difficult to assess Area of Impairment: Orientation;Attention;Following commands;Safety/judgement;Problem solving                 Orientation Level: Disoriented to;Place;Time;Situation Current Attention Level: Focused   Following Commands: Follows one step commands with increased time;Follows multi-step commands inconsistently     Problem Solving: Slow processing;Decreased initiation;Requires verbal cues;Requires tactile cues       General Comments       Exercises     Shoulder Instructions      Home Living Family/patient expects to be discharged to:: Skilled nursing facility Living Arrangements: Spouse/significant other Available Help at Discharge: Family                                    Prior Functioning/Environment Prior Level of Function : Needs assist  Cognitive Assist : Mobility (cognitive);ADLs (cognitive) Mobility (Cognitive): Step by step cues ADLs (Cognitive): Step by step cues Physical Assist : ADLs (physical)   ADLs (physical): Bathing;Dressing;Toileting;IADLs   ADLs Comments: patient had support from wife for bathing and dressing tasks. patient was able to transfer with min A and cues from one spot to another.        OT Problem List: Decreased strength;Decreased activity  tolerance;Impaired balance (sitting and/or standing);Decreased safety awareness;Decreased cognition;Decreased knowledge of use of DME or AE      OT Treatment/Interventions: Self-care/ADL training;Therapeutic exercise;Neuromuscular education;Energy conservation;DME and/or AE instruction;Therapeutic activities;Balance training;Patient/family education    OT Goals(Current goals can be found in the care plan section) Acute Rehab OT Goals Patient Stated Goal: none stated OT Goal Formulation: With patient/family Time For Goal Achievement: 08/14/21 Potential to Achieve Goals: Good  OT Frequency: Min 2X/week   Barriers to D/C: Decreased caregiver support  patients wife is unable to provide current level of care needed.       Co-evaluation              AM-PAC OT "6 Clicks" Daily Activity     Outcome Measure Help from another person eating meals?: A Lot Help from another person taking care of personal grooming?: A Lot Help from another person toileting, which includes using toliet, bedpan, or urinal?: A Lot Help from another person bathing (including washing, rinsing, drying)?: A Lot Help from another person to put on and taking off regular upper body clothing?: A Lot Help from another person to put on and taking off regular lower body clothing?: A Lot 6 Click Score: 12   End of Session Nurse Communication: Mobility status  Activity Tolerance: Patient tolerated treatment well Patient left: in bed;with call bell/phone within reach;with bed alarm set;with family/visitor  present  OT Visit Diagnosis: Unsteadiness on feet (R26.81);Other abnormalities of gait and mobility (R26.89);Muscle weakness (generalized) (M62.81);Repeated falls (R29.6)                Time: 4237-0230 OT Time Calculation (min): 23 min Charges:  OT General Charges $OT Visit: 1 Visit OT Evaluation $OT Eval Low Complexity: 1 Low OT Treatments $Self Care/Home Management : 8-22 mins  Jackelyn Poling OTR/L, MS Acute  Rehabilitation Department Office# [phone] Pager# [phone]   Marcellina Millin 07/31/2021, 3:26 PM

## 2021-08-01 LAB — BASIC METABOLIC PANEL
Anion gap: 8 (ref 5–15)
BUN: 13 mg/dL (ref 8–23)
CO2: 22 mmol/L (ref 22–32)
Calcium: 8.6 mg/dL — ABNORMAL LOW (ref 8.9–10.3)
Chloride: 109 mmol/L (ref 98–111)
Creatinine, Ser: 0.65 mg/dL (ref 0.61–1.24)
GFR, Estimated: >60 mL/min (ref >60)
Glucose, Bld: 84 mg/dL (ref 70–99)
Potassium: 4.5 mmol/L (ref 3.5–5.1)
Sodium: 139 mmol/L (ref 135–145)

## 2021-08-01 LAB — GLUCOSE, CAPILLARY: Glucose-Capillary: 82 mg/dL (ref 70–99)

## 2021-08-01 LAB — MAGNESIUM: Magnesium: 2 mg/dL (ref 1.7–2.4)

## 2021-08-01 NOTE — Progress Notes (Addendum)
Physical Therapy Treatment Patient Details Name: Adam Cordova MRN: [id number] DOB: [birth year] Today's Date: 08/01/2021   History of Present Illness 65 y.o. male with medical history significant of Parkinson's disease, dementia, narcolepsy, BPH. Presenting with syncopal episode.    PT Comments    Patient is resting in bed, sister is in room. Patient required mod assistance to  sit up onto bed edge, tends to list to the  right, increased tone on RUE and LE.  Patient stood with mod assistance at [employer]. Patient ambulated x 58' with EVA RW and min to mod assistance, variable balance. Patient will benefit from SNF for rehab to return to  ambulatory status as PTA.  Recommendations for follow up therapy are one component of a multi-disciplinary discharge planning process, led by the attending physician.  Recommendations may be updated based on patient status, additional functional criteria and insurance authorization.  Follow Up Recommendations  Skilled nursing-short term rehab (<3 hours/day)     Assistance Recommended at Discharge Frequent or constant Supervision/Assistance  Equipment Recommendations  None recommended by PT    Recommendations for Other Services       Precautions / Restrictions Precautions Precaution Comments: tremours in RUE     Mobility  Bed Mobility   Bed Mobility: Supine to Sit     Supine to sit: Mod assist     General bed mobility comments: multimodal cues to initiate mobility to move legs and sit upright.    Transfers Overall transfer level: Needs assistance Equipment used: Bilateral platform [place] Transfers: Sit to/from Stand Sit to Stand: Mod assist;+2 physical assistance;+2 safety/equipment           General transfer comment: mod assist to power up to stand at EVA RW, Assistance to place hands on grips    Ambulation/Gait Ambulation/Gait assistance: Mod then  assist;+2 safety/equipment Gait Distance (Feet): 60 Feet Assistive  device: Ethelene Hal Gait Pattern/deviations: Ataxic;Narrow base of support Gait velocity: decr     General Gait Details: improved stepping, RLE demonstates more spasticity but patient able to advance each foot, variable support required for balance.   Stairs             Wheelchair Mobility    Modified Rankin (Stroke Patients Only)       Balance Overall balance assessment: Needs assistance;History of Falls Sitting-balance support: Feet supported;Feet unsupported;No upper extremity supported Sitting balance-Leahy Scale: Fair Sitting balance - Comments: patient was noted to have posterior leaning during session with cues verbal and tactile to come forwards to maintain sitting balance.   Standing balance support: Bilateral upper extremity supported;During functional activity;Reliant on assistive device for balance Standing balance-Leahy Scale: Poor                              Cognition Arousal/Alertness: Awake/alert Behavior During Therapy: Flat affect Overall Cognitive Status: History of cognitive impairments - at baseline                         Following Commands: Follows one step commands with increased time     Problem Solving: Slow processing;Decreased initiation;Requires verbal cues;Requires tactile cues          Exercises      General Comments        Pertinent Vitals/Pain Breathing: normal Negative Vocalization: none Facial Expression: smiling or inexpressive Body Language: relaxed Consolability: no need to console PAINAD Score: 0    Home  Living                          Prior Function            PT Goals (current goals can now be found in the care plan section) Progress towards PT goals: Progressing toward goals    Frequency    Min 2X/week      PT Plan Current plan remains appropriate    Co-evaluation              AM-PAC PT "6 Clicks" Mobility   Outcome Measure  Help needed turning from  your back to your side while in a flat bed without using bedrails?: A Lot Help needed moving from lying on your back to sitting on the side of a flat bed without using bedrails?: A Lot Help needed moving to and from a bed to a chair (including a wheelchair)?: A Lot Help needed standing up from a chair using your arms (e.g., wheelchair or bedside chair)?: A Lot Help needed to walk in hospital room?: A Lot Help needed climbing 3-5 steps with a railing? : Total 6 Click Score: 11    End of Session Equipment Utilized During Treatment: Gait belt Activity Tolerance: Patient tolerated treatment well Patient left: in chair;with call bell/phone within reach;with chair alarm set;with family/visitor present Nurse Communication: Mobility status PT Visit Diagnosis: Other abnormalities of gait and mobility (R26.89);Difficulty in walking, not elsewhere classified (R26.2);Other symptoms and signs involving the nervous system (M38.177)     Time: 1165-7903 PT Time Calculation (min) (ACUTE ONLY): 17 min  Charges:  $Gait Training: 8-22 mins                     Adam Cordova PT Acute Rehabilitation Services Pager [phone] Office [phone]    Adam Cordova 08/01/2021, 12:54 PM

## 2021-08-01 NOTE — Progress Notes (Signed)
PROGRESS NOTE    JAKYLAN Cordova  YCX:[id number] DOB: 09-Sep-1955 DOA: 07/27/2021 PCP: [name], MD    Chief Complaint  Patient presents with   Loss of Consciousness    Brief Narrative:  DELVONTE Cordova is a 65 y.o. male with medical history significant of Parkinson's disease, dementia, narcolepsy, BPH. Presenting with syncopal episode. History from wife at bedside. He was on his couch this morning. He got up and moved to the kitchen. As he entered the kitchen, he collapsed onto his wife. She was able to catch him and rest him on a stool. He laid his head on her shoulder and "was out" for at least 5 minutes. There was no tonic-clonic movement, tongue swallowing, foaming at the mouth, or incontinence. When he came to, he was a little groggy. EMS was called.     Assessment & Plan:   Principal Problem:   Syncope Active Problems:   BPH with obstruction/lower urinary tract symptoms   Parkinson's disease (HCC)   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HCC)   Gait disturbance   NSVT (nonsustained ventricular tachycardia)   Leukocytosis   Syncope:  - sec to neurogenic orthostatic hypotension/ autonomic dysfunction from progressive Parkinson's disease.  -CT negative.  - d/c EEG.  - pt seen in consultation by cardiology who feel syncope likely in the setting of parkinson's disease. No structural heart disease noted on echo.  -Flomax and anti-Parkinson's agents and Aricept could likely be contributing however wife does not feel that patient can go without his medications at this time.  Non sustained V TACH: - cardiology on board and recommended event monitor on discharge.    Parkinson's diease/ dementia:  - progressive and worsening.  - recommend outpatient follow up with neurology and palliative care.  - deconditioned, therapy eval recommending SNF.    Bph:  Resume flomax.   Falls sec to Progressive parkinson's disease.    Leukocytosis.   Improving,  Unclear etiology.        DVT prophylaxis: (Lovenox) Code Status: DNR Family Communication: none at bedside.  Disposition:   Status is: Inpatient  Remains inpatient appropriate because: unsafe d/c plan.      Consultants:  Palliative care.   Procedures: none.   Antimicrobials: none.   Subjective: No new complaints.   Objective: Vitals:   07/31/21 1312 07/31/21 1932 08/01/21 0405 08/01/21 1458  BP: 136/89 (!) 121/107 135/81 (!) 134/94  Pulse: 62 67 75 79  Resp:  20 18 (!) 22  Temp: 98.1 F (36.7 C) 97.6 F (36.4 C) 98.3 F (36.8 C) 98.3 F (36.8 C)  TempSrc: Oral Oral Oral Oral  SpO2: 100% 99% 95% 99%  Weight:   73.6 kg   Height:        Intake/Output Summary (Last 24 hours) at 08/01/2021 1650 Last data filed at 08/01/2021 1459 Gross per 24 hour  Intake 950 ml  Output 1350 ml  Net -400 ml   Filed Weights   07/30/21 0439 07/31/21 0500 08/01/21 0405  Weight: 73.8 kg 75.8 kg 73.6 kg    Examination:  General exam: Appears calm and comfortable  Respiratory system: Clear to auscultation. Respiratory effort normal. Cardiovascular system: S1 & S2 heard, RRR. No JVD, No pedal edema. Gastrointestinal system: Abdomen is nondistended, soft and nontender. Normal bowel sounds heard. Central nervous system: Alert and oriented. No focal neurological deficits. Extremities: Symmetric 5 x 5 power. Skin: No rashes, lesions or ulcers Psychiatry:  Mood & affect appropriate.  Data Reviewed: I have personally reviewed following labs and imaging studies  CBC: Recent Labs  Lab 07/27/21 1108 07/28/21 0900 07/30/21 0601 07/31/21 0500  WBC 10.5 14.6* 7.3 11.9*  NEUTROABS 8.1*  --  5.1  --   HGB 13.2 13.9 13.5 13.8  HCT 41.4 44.0 39.8 42.6  MCV 85.4 84.5 85.8 84.0  PLT 235 273 230 756    Basic Metabolic Panel: Recent Labs  Lab 07/28/21 0900 07/28/21 0913 07/29/21 0529 07/30/21 0508 07/31/21 0500 08/01/21 0503  NA 140  --  138 141 138 139   K 3.8  --  4.4 3.7 4.1 4.5  CL 109  --  109 112* 111 109  CO2 23  --  23 24 20* 22  GLUCOSE 87  --  77 89 91 84  BUN 17  --  16 16 12 13   CREATININE 0.72  --  0.69 0.61 0.79 0.65  CALCIUM 8.8*  --  8.4* 8.3* 8.6* 8.6*  MG  --  2.2 2.2 2.0 2.0 2.0    GFR: Estimated Creatinine Clearance: 92.5 mL/min (by C-G formula based on SCr of 0.65 mg/dL).  Liver Function Tests: Recent Labs  Lab 07/28/21 0900  AST 11*  ALT <5  ALKPHOS 72  BILITOT 1.5*  PROT 6.9  ALBUMIN 4.1    CBG: Recent Labs  Lab 07/29/21 0405 07/29/21 1245 07/30/21 0438 07/31/21 0642 08/01/21 0408  GLUCAP 88 100* 81 90 82     Recent Results (from the past 240 hour(s))  Resp Panel by RT-PCR (Flu A&B, Covid) Nasopharyngeal Swab     Status: None   Collection Time: 07/27/21 11:31 AM   Specimen: Nasopharyngeal Swab; Nasopharyngeal(NP) swabs in vial transport medium  Result Value Ref Range Status   SARS Coronavirus 2 by RT PCR NEGATIVE NEGATIVE Final    Comment: (NOTE) SARS-CoV-2 target nucleic acids are NOT DETECTED.  The SARS-CoV-2 RNA is generally detectable in upper respiratory specimens during the acute phase of infection. The lowest concentration of SARS-CoV-2 viral copies this assay can detect is 138 copies/mL. A negative result does not preclude SARS-Cov-2 infection and should not be used as the sole basis for treatment or other patient management decisions. A negative result may occur with  improper specimen collection/handling, submission of specimen other than nasopharyngeal swab, presence of viral mutation(s) within the areas targeted by this assay, and inadequate number of viral copies(<138 copies/mL). A negative result must be combined with clinical observations, patient history, and epidemiological information. The expected result is Negative.  Fact Sheet for Patients:  EntrepreneurPulse.com.au  Fact Sheet for Healthcare Providers:   IncredibleEmployment.be  This test is no t yet approved or cleared by the [place] FDA and  has been authorized for detection and/or diagnosis of SARS-CoV-2 by FDA under an Emergency Use Authorization (EUA). This EUA will remain  in effect (meaning this test can be used) for the duration of the COVID-19 declaration under Section 564(b)(1) of the Act, 21 U.S.C.section 360bbb-3(b)(1), unless the authorization is terminated  or revoked sooner.       Influenza A by PCR NEGATIVE NEGATIVE Final   Influenza B by PCR NEGATIVE NEGATIVE Final    Comment: (NOTE) The Xpert Xpress SARS-CoV-2/FLU/RSV plus assay is intended as an aid in the diagnosis of influenza from Nasopharyngeal swab specimens and should not be used as a sole basis for treatment. Nasal washings and aspirates are unacceptable for Xpert Xpress SARS-CoV-2/FLU/RSV testing.  Fact Sheet for Patients: EntrepreneurPulse.com.au  Fact Sheet for Healthcare  Providers: IncredibleEmployment.be  This test is not yet approved or cleared by the [place] and has been authorized for detection and/or diagnosis of SARS-CoV-2 by FDA under an Emergency Use Authorization (EUA). This EUA will remain in effect (meaning this test can be used) for the duration of the COVID-19 declaration under Section 564(b)(1) of the Act, 21 U.S.C. section 360bbb-3(b)(1), unless the authorization is terminated or revoked.  Performed at [facility], [place] [address], [place], [place] [address]          Radiology Studies: No results found.      Scheduled Meds:  carbidopa-levodopa  1 tablet Oral QHS   carbidopa-levodopa  2 tablet Oral Q24H   carbidopa-levodopa  2.5 tablet Oral 2 times per day   carbidopa-levodopa  3 tablet Oral Q24H   donepezil  10 mg Oral QHS   enoxaparin (LOVENOX) injection  40 mg Subcutaneous Daily   influenza vaccine adjuvanted  0.5 mL  Intramuscular Tomorrow-1000   mirabegron ER  25 mg Oral Daily   QUEtiapine  25 mg Oral Daily   And   QUEtiapine  50 mg Oral QHS   senna-docusate  1 tablet Oral BID   sodium chloride flush  3 mL Intravenous Q12H   tamsulosin  0.4 mg Oral BID   vitamin B-12  1,000 mcg Oral Daily   Continuous Infusions:   LOS: 4 days        Hosie Poisson, MD [facility]   To contact the attending provider between 7A-7P or the covering provider during after hours 7P-7A, please log into the web site [email or web address] and access using universal [place] password for that web site. If you do not have the password, please call the hospital operator.  08/01/2021, 4:50 PM

## 2021-08-01 NOTE — Care Management Important Message (Signed)
Important Message  Patient Details IM Letter given to the Patient. Name: Adam Cordova MRN: [id number] Date of Birth: 08-20-1956   Medicare Important Message Given:  Yes     Kerin Salen 08/01/2021, 1:43 PM

## 2021-08-01 NOTE — Discharge Summary (Incomplete)
Physician Discharge Summary  [name] VQM:[id number] DOB: 10/10/1955 DOA: 07/27/2021  PCP: [name], MD  Admit date: 07/27/2021 Discharge date: 08/01/2021  Admitted From: ***(Home, ALF, ILF, SNF) Disposition:  ***(Home /***facility name / Residential Hospice)  Recommendations for Outpatient Follow-up:  Follow up with PCP in 1-2 weeks Please obtain BMP/CBC in one week Please follow up on the following pending results:  Home Health:*** (YES/NO) Equipment/Devices:*** (oxygen ?L, foley, PICC line--date placed, wound care, Pleurx)  Discharge Condition:*** (Stable, guarded, hospice) CODE STATUS:*** (FULL, DNR, Comfort Care) Diet recommendation: Heart Healthy / Carb Modified / Regular / Dysphagia   Brief/Interim Summary: You may copy/paste interim summary or write brief hospital course depending on length of stay  Discharge Diagnoses:  Principal Problem:   Syncope Active Problems:   BPH with obstruction/lower urinary tract symptoms   Parkinson's disease ([place])   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HCC)   Gait disturbance   NSVT (nonsustained ventricular tachycardia)   Leukocytosis    Discharge Instructions   Allergies as of 08/01/2021       Reactions   Bee Venom Anaphylaxis   Levaquin [levofloxacin Hemihydrate] Anaphylaxis, Swelling   Sertraline Diarrhea, Nausea Only     Med Rec must be completed prior to using this [facility]***       Follow-up Information     Donato Heinz, MD. Go on 09/13/2021.   Specialties: Cardiology, Radiology Why: Appt with Dr. Gardiner Rhyme 09/13/21 at 2 PM Contact information: [place] [address] [phone]                Allergies  Allergen Reactions   Bee Venom Anaphylaxis   Levaquin [Levofloxacin Hemihydrate] Anaphylaxis and Swelling   Sertraline Diarrhea and Nausea Only    Consultations: ***Specify  Physician/Group   Procedures/Studies: CT Head Wo Contrast  Result Date: 07/27/2021 CLINICAL DATA:  Altered mental status EXAM: CT HEAD WITHOUT CONTRAST TECHNIQUE: Contiguous axial images were obtained from the base of the skull through the vertex without intravenous contrast. COMPARISON:  05/15/2021 FINDINGS: Brain: There is atrophy and chronic small vessel disease changes. No acute intracranial abnormality. Specifically, no hemorrhage, hydrocephalus, mass lesion, acute infarction, or significant intracranial injury. Vascular: No hyperdense vessel or unexpected calcification. Skull: No acute calvarial abnormality. Sinuses/Orbits: No acute findings Other: None IMPRESSION: Atrophy, chronic microvascular disease. No acute intracranial abnormality. Electronically Signed   By: Rolm Baptise M.D.   On: 07/27/2021 11:42   DG CHEST PORT 1 VIEW  Result Date: 07/29/2021 CLINICAL DATA:  Leukocytosis, confusion, Parkinson's EXAM: PORTABLE CHEST 1 VIEW COMPARISON:  Portable exam 1107 hours compared to 07/27/2021 FINDINGS: Upper normal size of cardiac silhouette, likely accentuated by kyphosis. Mediastinal contours and pulmonary vascularity normal. Bibasilar atelectasis. No infiltrate, pleural effusion, or pneumothorax. IMPRESSION: Mild bibasilar atelectasis. Electronically Signed   By: Lavonia Dana M.D.   On: 07/29/2021 13:34   DG Chest Portable 1 View  Result Date: 07/27/2021 CLINICAL DATA:  Evaluate for pneumonia EXAM: PORTABLE CHEST 1 VIEW COMPARISON:  July 29, 2020 FINDINGS: Right costophrenic angles excluded from the field of view. The heart size and mediastinal contours are within normal limits. Bibasilar linear opacities. Lungs otherwise clear. The visualized skeletal structures are unremarkable. IMPRESSION: Bibasilar linear opacities, likely due to scarring or atelectasis. Electronically Signed   By: Yetta Glassman M.D.   On: 07/27/2021 12:36   ECHOCARDIOGRAM COMPLETE  Result Date: 07/28/2021     ECHOCARDIOGRAM REPORT   Patient Name:   Adam L  Cordova Date of Exam: 07/28/2021 Medical Rec #:  [id number]     Height:       70.0 in Accession #:    [id number]    Weight:       167.1 lb Date of Birth:  1956-04-03     BSA:          1.934 m Patient Age:    65 years      BP:           105/75 mmHg Patient Gender: M             HR:           74 bpm. Exam Location:  Inpatient Procedure: 2D Echo, Cardiac Doppler and Color Doppler Indications:    Syncope  History:        Patient has no prior history of Echocardiogram examinations.  Sonographer:    Glo Herring Referring Phys: [id number] [place]  1. Left ventricular ejection fraction, by estimation, is 60 to 65%. The left ventricle has normal function. Left ventricular endocardial border not optimally defined to evaluate regional wall motion. There is mild left ventricular hypertrophy. Left ventricular diastolic parameters are consistent with Grade I diastolic dysfunction (impaired relaxation).  2. Right ventricular systolic function is normal. The right ventricular size is mildly enlarged.  3. Left atrial size was mildly dilated.  4. The mitral valve is grossly normal. No evidence of mitral valve regurgitation. No evidence of mitral stenosis.  5. The aortic valve is tricuspid. Aortic valve regurgitation is not visualized. No aortic stenosis is present.  6. The inferior vena cava is normal in size with greater than 50% respiratory variability, suggesting right atrial pressure of 3 mmHg. FINDINGS  Left Ventricle: Left ventricular ejection fraction, by estimation, is 60 to 65%. The left ventricle has normal function. Left ventricular endocardial border not optimally defined to evaluate regional wall motion. The left ventricular internal cavity size was normal in size. There is mild left ventricular hypertrophy. Left ventricular diastolic parameters are consistent with Grade I diastolic dysfunction (impaired relaxation). Right Ventricle: The right ventricular  size is mildly enlarged. No increase in right ventricular wall thickness. Right ventricular systolic function is normal. Left Atrium: Left atrial size was mildly dilated. Right Atrium: Right atrial size was not well visualized. Pericardium: There is no evidence of pericardial effusion. Mitral Valve: The mitral valve is grossly normal. Mild mitral annular calcification. No evidence of mitral valve regurgitation. No evidence of mitral valve stenosis. Tricuspid Valve: The tricuspid valve is normal in structure. Tricuspid valve regurgitation is trivial. No evidence of tricuspid stenosis. Aortic Valve: The aortic valve is tricuspid. Aortic valve regurgitation is not visualized. No aortic stenosis is present. Aortic valve mean gradient measures 3.0 mmHg. Aortic valve peak gradient measures 5.7 mmHg. Aortic valve area, by VTI measures 2.43 cm. Pulmonic Valve: The pulmonic valve was normal in structure. Pulmonic valve regurgitation is trivial. No evidence of pulmonic stenosis. Aorta: The aortic root is normal in size and structure. Venous: The inferior vena cava is normal in size with greater than 50% respiratory variability, suggesting right atrial pressure of 3 mmHg. IAS/Shunts: No atrial level shunt detected by color flow Doppler.  LEFT VENTRICLE PLAX 2D LVIDd:         5.00 cm      Diastology LVIDs:         3.20 cm      LV e' medial:    7.62 cm/s LV PW:  1.20 cm      LV E/e' medial:  6.9 LV IVS:        1.20 cm      LV e' lateral:   11.20 cm/s LVOT diam:     2.15 cm      LV E/e' lateral: 4.7 LV SV:         53 LV SV Index:   28 LVOT Area:     3.63 cm  LV Volumes (MOD) LV vol d, MOD A2C: 115.0 ml LV vol d, MOD A4C: 125.0 ml LV vol s, MOD A2C: 51.8 ml LV vol s, MOD A4C: 56.9 ml LV SV MOD A2C:     63.2 ml LV SV MOD A4C:     125.0 ml LV SV MOD BP:      66.7 ml IVC IVC diam: 1.30 cm LEFT ATRIUM             Index LA diam:        3.80 cm 1.96 cm/m LA Vol (A2C):   63.9 ml 33.04 ml/m LA Vol (A4C):   83.8 ml 43.33 ml/m  LA Biplane Vol: 77.3 ml 39.97 ml/m  AORTIC VALVE                    PULMONIC VALVE AV Area (Vmax):    2.29 cm     PV Vmax:       0.71 m/s AV Area (Vmean):   2.39 cm     PV Peak grad:  2.0 mmHg AV Area (VTI):     2.43 cm AV Vmax:           119.00 cm/s AV Vmean:          81.700 cm/s AV VTI:            0.220 m AV Peak Grad:      5.7 mmHg AV Mean Grad:      3.0 mmHg LVOT Vmax:         75.10 cm/s LVOT Vmean:        53.700 cm/s LVOT VTI:          0.147 m LVOT/AV VTI ratio: 0.67  AORTA Ao Root diam: 3.70 cm MITRAL VALVE MV Area (PHT): 4.39 cm    SHUNTS MV Decel Time: 173 msec    Systemic VTI:  0.15 m MV E velocity: 52.30 cm/s  Systemic Diam: 2.15 cm MV A velocity: 68.60 cm/s MV E/A ratio:  0.76 Cherlynn Kaiser MD Electronically signed by Cherlynn Kaiser MD Signature Date/Time: 07/28/2021/1:18:22 PM    Final    (Echo, Carotid, EGD, Colonoscopy, ERCP)    Subjective:   Discharge Exam: Vitals:   08/01/21 0405 08/01/21 1458  BP: 135/81 (!) 134/94  Pulse: 75 79  Resp: 18 (!) 22  Temp: 98.3 F (36.8 C) 98.3 F (36.8 C)  SpO2: 95% 99%   Vitals:   07/31/21 1312 07/31/21 1932 08/01/21 0405 08/01/21 1458  BP: 136/89 (!) 121/107 135/81 (!) 134/94  Pulse: 62 67 75 79  Resp:  20 18 (!) 22  Temp: 98.1 F (36.7 C) 97.6 F (36.4 C) 98.3 F (36.8 C) 98.3 F (36.8 C)  TempSrc: Oral Oral Oral Oral  SpO2: 100% 99% 95% 99%  Weight:   73.6 kg   Height:        General: Pt is alert, awake, not in acute distress Cardiovascular: RRR, S1/S2 +, no rubs, no gallops Respiratory: CTA bilaterally, no wheezing, no rhonchi Abdominal: Soft, NT, ND,  bowel sounds + Extremities: no edema, no cyanosis    The results of significant diagnostics from this hospitalization (including imaging, microbiology, ancillary and laboratory) are listed below for reference.     Microbiology: Recent Results (from the past 240 hour(s))  Resp Panel by RT-PCR (Flu A&B, Covid) Nasopharyngeal Swab     Status: None   Collection  Time: 07/27/21 11:31 AM   Specimen: Nasopharyngeal Swab; Nasopharyngeal(NP) swabs in vial transport medium  Result Value Ref Range Status   SARS Coronavirus 2 by RT PCR NEGATIVE NEGATIVE Final    Comment: (NOTE) SARS-CoV-2 target nucleic acids are NOT DETECTED.  The SARS-CoV-2 RNA is generally detectable in upper respiratory specimens during the acute phase of infection. The lowest concentration of SARS-CoV-2 viral copies this assay can detect is 138 copies/mL. A negative result does not preclude SARS-Cov-2 infection and should not be used as the sole basis for treatment or other patient management decisions. A negative result may occur with  improper specimen collection/handling, submission of specimen other than nasopharyngeal swab, presence of viral mutation(s) within the areas targeted by this assay, and inadequate number of viral copies(<138 copies/mL). A negative result must be combined with clinical observations, patient history, and epidemiological information. The expected result is Negative.  Fact Sheet for Patients:  EntrepreneurPulse.com.au  Fact Sheet for Healthcare Providers:  IncredibleEmployment.be  This test is no t yet approved or cleared by the [place] FDA and  has been authorized for detection and/or diagnosis of SARS-CoV-2 by FDA under an Emergency Use Authorization (EUA). This EUA will remain  in effect (meaning this test can be used) for the duration of the COVID-19 declaration under Section 564(b)(1) of the Act, 21 U.S.C.section 360bbb-3(b)(1), unless the authorization is terminated  or revoked sooner.       Influenza A by PCR NEGATIVE NEGATIVE Final   Influenza B by PCR NEGATIVE NEGATIVE Final    Comment: (NOTE) The Xpert Xpress SARS-CoV-2/FLU/RSV plus assay is intended as an aid in the diagnosis of influenza from Nasopharyngeal swab specimens and should not be used as a sole basis for treatment. Nasal washings  and aspirates are unacceptable for Xpert Xpress SARS-CoV-2/FLU/RSV testing.  Fact Sheet for Patients: EntrepreneurPulse.com.au  Fact Sheet for Healthcare Providers: IncredibleEmployment.be  This test is not yet approved or cleared by the [place] FDA and has been authorized for detection and/or diagnosis of SARS-CoV-2 by FDA under an Emergency Use Authorization (EUA). This EUA will remain in effect (meaning this test can be used) for the duration of the COVID-19 declaration under Section 564(b)(1) of the Act, 21 U.S.C. section 360bbb-3(b)(1), unless the authorization is terminated or revoked.  Performed at [facility], [place] [address] [place], [place] [address]      Labs: BNP (last 3 results) No results for input(s): BNP in the last 8760 hours. Basic Metabolic Panel: Recent Labs  Lab 07/28/21 0900 07/28/21 0913 07/29/21 0529 07/30/21 0508 07/31/21 0500 08/01/21 0503  NA 140  --  138 141 138 139  K 3.8  --  4.4 3.7 4.1 4.5  CL 109  --  109 112* 111 109  CO2 23  --  23 24 20* 22  GLUCOSE 87  --  77 89 91 84  BUN 17  --  16 16 12 13   CREATININE 0.72  --  0.69 0.61 0.79 0.65  CALCIUM 8.8*  --  8.4* 8.3* 8.6* 8.6*  MG  --  2.2 2.2 2.0 2.0 2.0   Liver Function  Tests: Recent Labs  Lab 07/28/21 0900  AST 11*  ALT <5  ALKPHOS 72  BILITOT 1.5*  PROT 6.9  ALBUMIN 4.1   No results for input(s): LIPASE, AMYLASE in the last 168 hours. No results for input(s): AMMONIA in the last 168 hours. CBC: Recent Labs  Lab 07/27/21 1108 07/28/21 0900 07/30/21 0601 07/31/21 0500  WBC 10.5 14.6* 7.3 11.9*  NEUTROABS 8.1*  --  5.1  --   HGB 13.2 13.9 13.5 13.8  HCT 41.4 44.0 39.8 42.6  MCV 85.4 84.5 85.8 84.0  PLT 235 273 230 242   Cardiac Enzymes: No results for input(s): CKTOTAL, CKMB, CKMBINDEX, TROPONINI in the last 168 hours. BNP: Invalid input(s): POCBNP CBG: Recent Labs  Lab 07/29/21 0405  07/29/21 1245 07/30/21 0438 07/31/21 0642 08/01/21 0408  GLUCAP 88 100* 81 90 82   D-Dimer No results for input(s): DDIMER in the last 72 hours. Hgb A1c No results for input(s): HGBA1C in the last 72 hours. Lipid Profile No results for input(s): CHOL, HDL, LDLCALC, TRIG, CHOLHDL, LDLDIRECT in the last 72 hours. Thyroid function studies No results for input(s): TSH, T4TOTAL, T3FREE, THYROIDAB in the last 72 hours.  Invalid input(s): FREET3 Anemia work up No results for input(s): VITAMINB12, FOLATE, FERRITIN, TIBC, IRON, RETICCTPCT in the last 72 hours. Urinalysis    Component Value Date/Time   COLORURINE YELLOW 07/27/2021 1529   APPEARANCEUR CLEAR 07/27/2021 1529   APPEARANCEUR Clear 12/16/2018 1029   LABSPEC 1.023 07/27/2021 1529   PHURINE 6.0 07/27/2021 1529   GLUCOSEU NEGATIVE 07/27/2021 1529   HGBUR NEGATIVE 07/27/2021 1529   BILIRUBINUR NEGATIVE 07/27/2021 1529   BILIRUBINUR Negative 12/16/2018 1029   KETONESUR 5 (A) 07/27/2021 1529   PROTEINUR NEGATIVE 07/27/2021 1529   UROBILINOGEN 0.2 09/18/2011 1943   NITRITE NEGATIVE 07/27/2021 1529   LEUKOCYTESUR NEGATIVE 07/27/2021 1529   Sepsis Labs Invalid input(s): PROCALCITONIN,  WBC,  LACTICIDVEN Microbiology Recent Results (from the past 240 hour(s))  Resp Panel by RT-PCR (Flu A&B, Covid) Nasopharyngeal Swab     Status: None   Collection Time: 07/27/21 11:31 AM   Specimen: Nasopharyngeal Swab; Nasopharyngeal(NP) swabs in vial transport medium  Result Value Ref Range Status   SARS Coronavirus 2 by RT PCR NEGATIVE NEGATIVE Final    Comment: (NOTE) SARS-CoV-2 target nucleic acids are NOT DETECTED.  The SARS-CoV-2 RNA is generally detectable in upper respiratory specimens during the acute phase of infection. The lowest concentration of SARS-CoV-2 viral copies this assay can detect is 138 copies/mL. A negative result does not preclude SARS-Cov-2 infection and should not be used as the sole basis for treatment or other  patient management decisions. A negative result may occur with  improper specimen collection/handling, submission of specimen other than nasopharyngeal swab, presence of viral mutation(s) within the areas targeted by this assay, and inadequate number of viral copies(<138 copies/mL). A negative result must be combined with clinical observations, patient history, and epidemiological information. The expected result is Negative.  Fact Sheet for Patients:  EntrepreneurPulse.com.au  Fact Sheet for Healthcare Providers:  IncredibleEmployment.be  This test is no t yet approved or cleared by the [place] FDA and  has been authorized for detection and/or diagnosis of SARS-CoV-2 by FDA under an Emergency Use Authorization (EUA). This EUA will remain  in effect (meaning this test can be used) for the duration of the COVID-19 declaration under Section 564(b)(1) of the Act, 21 U.S.C.section 360bbb-3(b)(1), unless the authorization is terminated  or revoked sooner.  Influenza A by PCR NEGATIVE NEGATIVE Final   Influenza B by PCR NEGATIVE NEGATIVE Final    Comment: (NOTE) The Xpert Xpress SARS-CoV-2/FLU/RSV plus assay is intended as an aid in the diagnosis of influenza from Nasopharyngeal swab specimens and should not be used as a sole basis for treatment. Nasal washings and aspirates are unacceptable for Xpert Xpress SARS-CoV-2/FLU/RSV testing.  Fact Sheet for Patients: EntrepreneurPulse.com.au  Fact Sheet for Healthcare Providers: IncredibleEmployment.be  This test is not yet approved or cleared by the [place] FDA and has been authorized for detection and/or diagnosis of SARS-CoV-2 by FDA under an Emergency Use Authorization (EUA). This EUA will remain in effect (meaning this test can be used) for the duration of the COVID-19 declaration under Section 564(b)(1) of the Act, 21 U.S.C. section  360bbb-3(b)(1), unless the authorization is terminated or revoked.  Performed at [facility], [place] [address] [place], [place] [address]      Time coordinating discharge:33 minutes.   SIGNED:   Hosie Poisson, MD  [facility] 08/01/2021, 4:50 PM

## 2021-08-02 ENCOUNTER — Encounter (HOSPITAL_COMMUNITY): Payer: Self-pay | Admitting: Obstetrics and Gynecology

## 2021-08-02 LAB — GLUCOSE, CAPILLARY
Glucose-Capillary: 93 mg/dL (ref 70–99)
Glucose-Capillary: 80 mg/dL (ref 70–99)

## 2021-08-02 MED ORDER — BISACODYL 5 MG PO TBEC
10.0000 mg | DELAYED_RELEASE_TABLET | Freq: Every day | ORAL | Status: DC | PRN
  Administered 2021-08-04: 10 mg via ORAL
  Filled 2021-08-02: qty 2

## 2021-08-02 MED ORDER — POLYETHYLENE GLYCOL 3350 17 G PO PACK
17.0000 g | PACK | Freq: Two times a day (BID) | ORAL | Status: DC
  Administered 2021-08-02 – 2021-08-06 (×6): 17 g via ORAL
  Filled 2021-08-02 (×6): qty 1

## 2021-08-02 NOTE — Plan of Care (Signed)

## 2021-08-02 NOTE — Progress Notes (Signed)
PROGRESS NOTE    Adam Cordova  OIB:[id number] DOB: April 24, 1956 DOA: 07/27/2021 PCP: [name], MD    Chief Complaint  Patient presents with   Loss of Consciousness    Brief Narrative:  Adam Cordova is a 65 y.o. male with medical history significant of Parkinson's disease, dementia, narcolepsy, BPH. Presenting with syncopal episode. History from wife at bedside. He was on his couch this morning. He got up and moved to the kitchen. As he entered the kitchen, he collapsed onto his wife. She was able to catch him and rest him on a stool. He laid his head on her shoulder and "was out" for at least 5 minutes. There was no tonic-clonic movement, tongue swallowing, foaming at the mouth, or incontinence. When he came to, he was a little groggy. EMS was called.     Assessment & Plan:   Principal Problem:   Syncope Active Problems:   BPH with obstruction/lower urinary tract symptoms   Parkinson's disease (HCC)   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HCC)   Gait disturbance   NSVT (nonsustained ventricular tachycardia)   Leukocytosis   Syncope:  - sec to neurogenic orthostatic hypotension/ autonomic dysfunction from progressive Parkinson's disease.  -CT negative.  - d/c EEG.  - pt seen in consultation by cardiology who feel syncope likely in the setting of parkinson's disease. No structural heart disease noted on echo.  -Flomax and anti-Parkinson's agents and Aricept could likely be contributing however wife does not feel that patient can go without his medications at this time.  Non sustained V TACH: - cardiology on board and recommended event monitor on discharge.    Parkinson's diease/ dementia:  - progressive and worsening.  - recommend outpatient follow up with neurology and palliative care.  - deconditioned, therapy eval recommending SNF.    Bph:  Resume flomax.   Falls sec to Progressive parkinson's disease.  None this admission.     Leukocytosis.  Improving,  Unclear etiology.      DVT prophylaxis: (Lovenox) Code Status: DNR Family Communication: none at bedside.  Disposition:   Status is: Inpatient  Remains inpatient appropriate because: unsafe d/c plan.      Consultants:  Palliative care.   Procedures: none.   Antimicrobials: none.   Subjective: No complaints.   Objective: Vitals:   08/01/21 1458 08/01/21 2037 08/02/21 0500 08/02/21 1349  BP: (!) 134/94 119/87  138/75  Pulse: 79 78  83  Resp: (!) 22 19  17   Temp: 98.3 F (36.8 C) 98.1 F (36.7 C)  (!) 97.5 F (36.4 C)  TempSrc: Oral   Oral  SpO2: 99% 95%  95%  Weight:   72.7 kg   Height:        Intake/Output Summary (Last 24 hours) at 08/02/2021 1553 Last data filed at 08/02/2021 9450 Gross per 24 hour  Intake --  Output 700 ml  Net -700 ml    Filed Weights   07/31/21 0500 08/01/21 0405 08/02/21 0500  Weight: 75.8 kg 73.6 kg 72.7 kg    Examination:  General exam: Appears calm and comfortable  Respiratory system: Clear to auscultation. Respiratory effort normal. Cardiovascular system: S1 & S2 heard, RRR. No JVD,  No pedal edema. Gastrointestinal system: Abdomen is nondistended, soft and nontender. Normal bowel sounds heard. Central nervous system: Alert and oriented. No focal neurological deficits. Extremities: Symmetric 5 x 5 power. Skin: No rashes, lesions or ulcers Psychiatry: Mood & affect appropriate.     Data  Reviewed: I have personally reviewed following labs and imaging studies  CBC: Recent Labs  Lab 07/27/21 1108 07/28/21 0900 07/30/21 0601 07/31/21 0500  WBC 10.5 14.6* 7.3 11.9*  NEUTROABS 8.1*  --  5.1  --   HGB 13.2 13.9 13.5 13.8  HCT 41.4 44.0 39.8 42.6  MCV 85.4 84.5 85.8 84.0  PLT 235 273 230 242     Basic Metabolic Panel: Recent Labs  Lab 07/28/21 0900 07/28/21 0913 07/29/21 0529 07/30/21 0508 07/31/21 0500 08/01/21 0503  NA 140  --  138 141 138 139  K 3.8  --  4.4 3.7 4.1 4.5   CL 109  --  109 112* 111 109  CO2 23  --  23 24 20* 22  GLUCOSE 87  --  77 89 91 84  BUN 17  --  16 16 12 13   CREATININE 0.72  --  0.69 0.61 0.79 0.65  CALCIUM 8.8*  --  8.4* 8.3* 8.6* 8.6*  MG  --  2.2 2.2 2.0 2.0 2.0     GFR: Estimated Creatinine Clearance: 92.1 mL/min (by C-G formula based on SCr of 0.65 mg/dL).  Liver Function Tests: Recent Labs  Lab 07/28/21 0900  AST 11*  ALT <5  ALKPHOS 72  BILITOT 1.5*  PROT 6.9  ALBUMIN 4.1     CBG: Recent Labs  Lab 07/29/21 1245 07/30/21 0438 07/31/21 0642 08/01/21 0408 08/02/21 0525  GLUCAP 100* 81 90 82 93      Recent Results (from the past 240 hour(s))  Resp Panel by RT-PCR (Flu A&B, Covid) Nasopharyngeal Swab     Status: None   Collection Time: 07/27/21 11:31 AM   Specimen: Nasopharyngeal Swab; Nasopharyngeal(NP) swabs in vial transport medium  Result Value Ref Range Status   SARS Coronavirus 2 by RT PCR NEGATIVE NEGATIVE Final    Comment: (NOTE) SARS-CoV-2 target nucleic acids are NOT DETECTED.  The SARS-CoV-2 RNA is generally detectable in upper respiratory specimens during the acute phase of infection. The lowest concentration of SARS-CoV-2 viral copies this assay can detect is 138 copies/mL. A negative result does not preclude SARS-Cov-2 infection and should not be used as the sole basis for treatment or other patient management decisions. A negative result may occur with  improper specimen collection/handling, submission of specimen other than nasopharyngeal swab, presence of viral mutation(s) within the areas targeted by this assay, and inadequate number of viral copies(<138 copies/mL). A negative result must be combined with clinical observations, patient history, and epidemiological information. The expected result is Negative.  Fact Sheet for Patients:  EntrepreneurPulse.com.au  Fact Sheet for Healthcare Providers:  IncredibleEmployment.be  This test is no t  yet approved or cleared by the [place] FDA and  has been authorized for detection and/or diagnosis of SARS-CoV-2 by FDA under an Emergency Use Authorization (EUA). This EUA will remain  in effect (meaning this test can be used) for the duration of the COVID-19 declaration under Section 564(b)(1) of the Act, 21 U.S.C.section 360bbb-3(b)(1), unless the authorization is terminated  or revoked sooner.       Influenza A by PCR NEGATIVE NEGATIVE Final   Influenza B by PCR NEGATIVE NEGATIVE Final    Comment: (NOTE) The Xpert Xpress SARS-CoV-2/FLU/RSV plus assay is intended as an aid in the diagnosis of influenza from Nasopharyngeal swab specimens and should not be used as a sole basis for treatment. Nasal washings and aspirates are unacceptable for Xpert Xpress SARS-CoV-2/FLU/RSV testing.  Fact Sheet for Patients: EntrepreneurPulse.com.au  Fact  Sheet for Healthcare Providers: IncredibleEmployment.be  This test is not yet approved or cleared by the [place] and has been authorized for detection and/or diagnosis of SARS-CoV-2 by FDA under an Emergency Use Authorization (EUA). This EUA will remain in effect (meaning this test can be used) for the duration of the COVID-19 declaration under Section 564(b)(1) of the Act, 21 U.S.C. section 360bbb-3(b)(1), unless the authorization is terminated or revoked.  Performed at [facility], [place] [address], [place], [place] [address]           Radiology Studies: No results found.      Scheduled Meds:  carbidopa-levodopa  1 tablet Oral QHS   carbidopa-levodopa  2 tablet Oral Q24H   carbidopa-levodopa  2.5 tablet Oral 2 times per day   carbidopa-levodopa  3 tablet Oral Q24H   donepezil  10 mg Oral QHS   enoxaparin (LOVENOX) injection  40 mg Subcutaneous Daily   influenza vaccine adjuvanted  0.5 mL Intramuscular Tomorrow-1000   mirabegron ER  25 mg Oral Daily    QUEtiapine  25 mg Oral Daily   And   QUEtiapine  50 mg Oral QHS   senna-docusate  1 tablet Oral BID   sodium chloride flush  3 mL Intravenous Q12H   tamsulosin  0.4 mg Oral BID   vitamin B-12  1,000 mcg Oral Daily   Continuous Infusions:   LOS: 5 days        Hosie Poisson, MD [facility]   To contact the attending provider between 7A-7P or the covering provider during after hours 7P-7A, please log into the web site [email or web address] and access using universal [place] password for that web site. If you do not have the password, please call the hospital operator.  08/02/2021, 3:53 PM

## 2021-08-03 ENCOUNTER — Inpatient Hospital Stay (HOSPITAL_COMMUNITY): Payer: PPO

## 2021-08-03 LAB — GLUCOSE, CAPILLARY: Glucose-Capillary: 89 mg/dL (ref 70–99)

## 2021-08-03 MED ORDER — BISACODYL 10 MG RE SUPP: 10.0000 mg | Freq: Every day | RECTAL | Status: DC | PRN

## 2021-08-03 NOTE — Progress Notes (Signed)
PROGRESS NOTE    [name]  GGY:[id number] DOB: 12/11/1955 DOA: 07/27/2021 PCP: [name], MD    Chief Complaint  Patient presents with   Loss of Consciousness    Brief Narrative:  Adam Cordova is a 65 y.o. male with medical history significant of Parkinson's disease, dementia, narcolepsy, BPH. Presenting with syncopal episode. History from wife at bedside. He was on his couch this morning. He got up and moved to the kitchen. As he entered the kitchen, he collapsed onto his wife. She was able to catch him and rest him on a stool. He laid his head on her shoulder and "was out" for at least 5 minutes. There was no tonic-clonic movement, tongue swallowing, foaming at the mouth, or incontinence. When he came to, he was a little groggy. EMS was called.     Assessment & Plan:   Principal Problem:   Syncope Active Problems:   BPH with obstruction/lower urinary tract symptoms   Parkinson's disease (HCC)   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HCC)   Gait disturbance   NSVT (nonsustained ventricular tachycardia)   Leukocytosis   Syncope:  - sec to neurogenic orthostatic hypotension/ autonomic dysfunction from progressive Parkinson's disease.  -CT negative.  - d/c EEG.  - pt seen in consultation by cardiology who feel syncope likely in the setting of parkinson's disease. No structural heart disease noted on echo.  -Flomax and anti-Parkinson's agents and Aricept could likely be contributing however wife does not feel that patient can go without his medications at this time. - pt denies any chest pain or sob.   Non sustained V TACH: - cardiology on board and recommended event monitor on discharge.    Parkinson's diease/ dementia:  - progressive and worsening.  - recommend outpatient follow up with neurology and palliative care.  - deconditioned, therapy eval recommending SNF.  - currently waiting for bed at SNF.    BPH Resume flomax.    Falls sec to Progressive parkinson's disease.  None this admission.    Leukocytosis.  Improving,  Unclear etiology.   Constipation:  - started on stool softeners and dulcolax suppository and ordered abd x ray.    DVT prophylaxis: (Lovenox) Code Status: DNR Family Communication: none at bedside.  Disposition:   Status is: Inpatient  Remains inpatient appropriate because: unsafe d/c plan.      Consultants:  Palliative care.   Procedures: none.   Antimicrobials: none.   Subjective: No BM since Sunday.   Objective: Vitals:   08/02/21 0500 08/02/21 1349 08/02/21 2033 08/03/21 0508  BP:  138/75 (!) 120/96 (!) 143/104  Pulse:  83 64 64  Resp:  17 18 20   Temp:  (!) 97.5 F (36.4 C) 98.6 F (37 C) 98.2 F (36.8 C)  TempSrc:  Oral  Oral  SpO2:  95% 95% 96%  Weight: 72.7 kg   72.1 kg  Height:        Intake/Output Summary (Last 24 hours) at 08/03/2021 1104 Last data filed at 08/03/2021 0350 Gross per 24 hour  Intake 300 ml  Output 750 ml  Net -450 ml    Filed Weights   08/01/21 0405 08/02/21 0500 08/03/21 0508  Weight: 73.6 kg 72.7 kg 72.1 kg    Examination:  General exam: Appears calm and comfortable  Respiratory system: Clear to auscultation. Respiratory effort normal. Cardiovascular system: S1 & S2 heard, RRR. No JVD,  No pedal edema. Gastrointestinal system: Abdomen is nondistended, soft and nontender. Normal  bowel sounds heard. Central nervous system: Alert and oriented to person only. No focal deficits.  Extremities: no pedal edema.  Skin: No rashes,  Psychiatry: Mood & affect appropriate.      Data Reviewed: I have personally reviewed following labs and imaging studies  CBC: Recent Labs  Lab 07/27/21 1108 07/28/21 0900 07/30/21 0601 07/31/21 0500  WBC 10.5 14.6* 7.3 11.9*  NEUTROABS 8.1*  --  5.1  --   HGB 13.2 13.9 13.5 13.8  HCT 41.4 44.0 39.8 42.6  MCV 85.4 84.5 85.8 84.0  PLT 235 273 230 242     Basic Metabolic  Panel: Recent Labs  Lab 07/28/21 0900 07/28/21 0913 07/29/21 0529 07/30/21 0508 07/31/21 0500 08/01/21 0503  NA 140  --  138 141 138 139  K 3.8  --  4.4 3.7 4.1 4.5  CL 109  --  109 112* 111 109  CO2 23  --  23 24 20* 22  GLUCOSE 87  --  77 89 91 84  BUN 17  --  16 16 12 13   CREATININE 0.72  --  0.69 0.61 0.79 0.65  CALCIUM 8.8*  --  8.4* 8.3* 8.6* 8.6*  MG  --  2.2 2.2 2.0 2.0 2.0     GFR: Estimated Creatinine Clearance: 91.4 mL/min (by C-G formula based on SCr of 0.65 mg/dL).  Liver Function Tests: Recent Labs  Lab 07/28/21 0900  AST 11*  ALT <5  ALKPHOS 72  BILITOT 1.5*  PROT 6.9  ALBUMIN 4.1     CBG: Recent Labs  Lab 07/31/21 0642 08/01/21 0408 08/02/21 0525 08/02/21 2131 08/03/21 0508  GLUCAP 90 82 93 80 89      Recent Results (from the past 240 hour(s))  Resp Panel by RT-PCR (Flu A&B, Covid) Nasopharyngeal Swab     Status: None   Collection Time: 07/27/21 11:31 AM   Specimen: Nasopharyngeal Swab; Nasopharyngeal(NP) swabs in vial transport medium  Result Value Ref Range Status   SARS Coronavirus 2 by RT PCR NEGATIVE NEGATIVE Final    Comment: (NOTE) SARS-CoV-2 target nucleic acids are NOT DETECTED.  The SARS-CoV-2 RNA is generally detectable in upper respiratory specimens during the acute phase of infection. The lowest concentration of SARS-CoV-2 viral copies this assay can detect is 138 copies/mL. A negative result does not preclude SARS-Cov-2 infection and should not be used as the sole basis for treatment or other patient management decisions. A negative result may occur with  improper specimen collection/handling, submission of specimen other than nasopharyngeal swab, presence of viral mutation(s) within the areas targeted by this assay, and inadequate number of viral copies(<138 copies/mL). A negative result must be combined with clinical observations, patient history, and epidemiological information. The expected result is  Negative.  Fact Sheet for Patients:  EntrepreneurPulse.com.au  Fact Sheet for Healthcare Providers:  IncredibleEmployment.be  This test is no t yet approved or cleared by the [place] FDA and  has been authorized for detection and/or diagnosis of SARS-CoV-2 by FDA under an Emergency Use Authorization (EUA). This EUA will remain  in effect (meaning this test can be used) for the duration of the COVID-19 declaration under Section 564(b)(1) of the Act, 21 U.S.C.section 360bbb-3(b)(1), unless the authorization is terminated  or revoked sooner.       Influenza A by PCR NEGATIVE NEGATIVE Final   Influenza B by PCR NEGATIVE NEGATIVE Final    Comment: (NOTE) The Xpert Xpress SARS-CoV-2/FLU/RSV plus assay is intended as an aid in the diagnosis  of influenza from Nasopharyngeal swab specimens and should not be used as a sole basis for treatment. Nasal washings and aspirates are unacceptable for Xpert Xpress SARS-CoV-2/FLU/RSV testing.  Fact Sheet for Patients: EntrepreneurPulse.com.au  Fact Sheet for Healthcare Providers: IncredibleEmployment.be  This test is not yet approved or cleared by the [place] FDA and has been authorized for detection and/or diagnosis of SARS-CoV-2 by FDA under an Emergency Use Authorization (EUA). This EUA will remain in effect (meaning this test can be used) for the duration of the COVID-19 declaration under Section 564(b)(1) of the Act, 21 U.S.C. section 360bbb-3(b)(1), unless the authorization is terminated or revoked.  Performed at [facility], [place] [address], [place], [place] [address]           Radiology Studies: No results found.      Scheduled Meds:  carbidopa-levodopa  1 tablet Oral QHS   carbidopa-levodopa  2 tablet Oral Q24H   carbidopa-levodopa  2.5 tablet Oral 2 times per day   carbidopa-levodopa  3 tablet Oral Q24H   donepezil   10 mg Oral QHS   enoxaparin (LOVENOX) injection  40 mg Subcutaneous Daily   influenza vaccine adjuvanted  0.5 mL Intramuscular Tomorrow-1000   mirabegron ER  25 mg Oral Daily   polyethylene glycol  17 g Oral BID   QUEtiapine  25 mg Oral Daily   And   QUEtiapine  50 mg Oral QHS   senna-docusate  1 tablet Oral BID   sodium chloride flush  3 mL Intravenous Q12H   tamsulosin  0.4 mg Oral BID   vitamin B-12  1,000 mcg Oral Daily   Continuous Infusions:   LOS: 6 days        Hosie Poisson, MD [facility]   To contact the attending provider between 7A-7P or the covering provider during after hours 7P-7A, please log into the web site [email or web address] and access using universal [place] password for that web site. If you do not have the password, please call the hospital operator.  08/03/2021, 11:04 AM

## 2021-08-03 NOTE — TOC Progression Note (Signed)
Transition of Care [facility]) - Progression Note    Patient Details  Name: Adam Cordova MRN: [id number] Date of Birth: Aug 19, 1956  Transition of Care [facility]) CM/SW [place], [place] Phone Number: 08/03/2021, 3:22 PM  Clinical Narrative:   Ebony Hail at [facility] came by to see patient, made bed offer for rehab.  When told of cost for on-going care, wife states that is unaffordable.  Ebony Hail stated that if we get insurance authorization, she will work with wife to identify ALF level of care that can take care of patient, pending patient progression with PT goals, which wife will be able to afford. Confirmed this plan with Ms Hodkinson.  Contacted [facility] and initiated SNF authorization request. TOC will continue to follow during the course of hospitalization.     Expected Discharge Plan: [place] Barriers to Discharge: Continued Medical Work up, SNF Pending bed offer  Expected Discharge Plan and Services Expected Discharge Plan: [place] In-house Referral: Clinical Social Work   Post Acute Care Choice: [place] Living arrangements for the past 2 months: Single Family Home                                       Social Determinants of Health (SDOH) Interventions    Readmission Risk Interventions No flowsheet data found.

## 2021-08-03 NOTE — Care Management Important Message (Signed)
Important Message  Patient Details IM Letter placed in Patients room. Name: Adam Cordova MRN: [id number] Date of Birth: 10-01-1955   Medicare Important Message Given:  Yes     Kerin Salen 08/03/2021, 11:16 AM

## 2021-08-04 LAB — GLUCOSE, CAPILLARY: Glucose-Capillary: 100 mg/dL — ABNORMAL HIGH (ref 70–99)

## 2021-08-04 NOTE — Progress Notes (Signed)
PROGRESS NOTE    Adam Cordova  YQI:[id number] DOB: 1955/09/18 DOA: 07/27/2021 PCP: [name], MD    Chief Complaint  Patient presents with   Loss of Consciousness    Brief Narrative:  Adam Cordova is a 65 y.o. male with medical history significant of Parkinson's disease, dementia, narcolepsy, BPH. Presenting with syncopal episode. History from wife at bedside. He was on his couch this morning. He got up and moved to the kitchen. As he entered the kitchen, he collapsed onto his wife. She was able to catch him and rest him on a stool. He laid his head on her shoulder and "was out" for at least 5 minutes. There was no tonic-clonic movement, tongue swallowing, foaming at the mouth, or incontinence. When he came to, he was a little groggy. EMS was called. Further work up revealed its probably from progression of parkinson's disease.  Therapy eval recommending SNF.     Assessment & Plan:   Principal Problem:   Syncope Active Problems:   BPH with obstruction/lower urinary tract symptoms   Parkinson's disease (HCC)   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HCC)   Gait disturbance   NSVT (nonsustained ventricular tachycardia)   Leukocytosis   Syncope:  - sec to neurogenic orthostatic hypotension/ autonomic dysfunction from progressive Parkinson's disease.  -CT negative.  - d/c EEG.  - pt seen in consultation by cardiology who feel syncope likely in the setting of parkinson's disease. No structural heart disease noted on echo.  -Flomax and anti-Parkinson's agents and Aricept could likely be contributing however wife does not feel that patient can go without his medications at this time. - NO NEW COMPLAINTS today.   Non sustained V TACH: - cardiology on board and recommended event monitor on discharge.  - no events overnight.    Parkinson's diease/ dementia:  - progressive and worsening.  - recommend outpatient follow up with neurology and  palliative care.  - deconditioned, therapy eval recommending SNF.  - currently waiting for bed at SNF.    BPH Resume flomax.   Falls sec to Progressive parkinson's disease.  None this admission.    Leukocytosis.  Improving,  Unclear etiology. Recheck cbc in am.   Constipation:  - started on stool softeners and dulcolax suppository and ordered abd x ray. Abd x ray is nonobstructive gas pattern.    DVT prophylaxis: (Lovenox) Code Status: DNR Family Communication: none at bedside.  Disposition:   Status is: Inpatient  Remains inpatient appropriate because: unsafe d/c plan.      Consultants:  Palliative care.   Procedures: none.   Antimicrobials: none.   Subjective: No new complaints. In bed, slightly confused this am.  Objective: Vitals:   08/03/21 1200 08/03/21 2111 08/04/21 0500 08/04/21 1329  BP: (!) 140/97 (!) 156/114  (!) 141/97  Pulse: 76 87  66  Resp: 20 20  20   Temp: 98 F (36.7 C) 98.1 F (36.7 C)  97.9 F (36.6 C)  TempSrc: Oral Oral  Oral  SpO2: 95% 95%  96%  Weight:   73.9 kg   Height:        Intake/Output Summary (Last 24 hours) at 08/04/2021 1438 Last data filed at 08/04/2021 1347 Gross per 24 hour  Intake 600 ml  Output 875 ml  Net -275 ml    Filed Weights   08/02/21 0500 08/03/21 0508 08/04/21 0500  Weight: 72.7 kg 72.1 kg 73.9 kg    Examination:  General exam: Appears calm and  comfortable  Respiratory system: Clear to auscultation. Respiratory effort normal. Cardiovascular system: S1 & S2 heard, RRR. No JVD,  No pedal edema. Gastrointestinal system: Abdomen is nondistended, soft and nontender. Normal bowel sounds heard. Central nervous system: Alert , but slightly confused  Extremities: Symmetric 5 x 5 power. Skin: No rashes, lesions or ulcers Psychiatry: Mood & affect appropriate.       Data Reviewed: I have personally reviewed following labs and imaging studies  CBC: Recent Labs  Lab 07/30/21 0601 07/31/21 0500   WBC 7.3 11.9*  NEUTROABS 5.1  --   HGB 13.5 13.8  HCT 39.8 42.6  MCV 85.8 84.0  PLT 230 242     Basic Metabolic Panel: Recent Labs  Lab 07/29/21 0529 07/30/21 0508 07/31/21 0500 08/01/21 0503  NA 138 141 138 139  K 4.4 3.7 4.1 4.5  CL 109 112* 111 109  CO2 23 24 20* 22  GLUCOSE 77 89 91 84  BUN 16 16 12 13   CREATININE 0.69 0.61 0.79 0.65  CALCIUM 8.4* 8.3* 8.6* 8.6*  MG 2.2 2.0 2.0 2.0     GFR: Estimated Creatinine Clearance: 92.5 mL/min (by C-G formula based on SCr of 0.65 mg/dL).  Liver Function Tests: No results for input(s): AST, ALT, ALKPHOS, BILITOT, PROT, ALBUMIN in the last 168 hours.   CBG: Recent Labs  Lab 08/01/21 0408 08/02/21 0525 08/02/21 2131 08/03/21 0508 08/04/21 0517  GLUCAP 82 93 80 89 100*      Recent Results (from the past 240 hour(s))  Resp Panel by RT-PCR (Flu A&B, Covid) Nasopharyngeal Swab     Status: None   Collection Time: 07/27/21 11:31 AM   Specimen: Nasopharyngeal Swab; Nasopharyngeal(NP) swabs in vial transport medium  Result Value Ref Range Status   SARS Coronavirus 2 by RT PCR NEGATIVE NEGATIVE Final    Comment: (NOTE) SARS-CoV-2 target nucleic acids are NOT DETECTED.  The SARS-CoV-2 RNA is generally detectable in upper respiratory specimens during the acute phase of infection. The lowest concentration of SARS-CoV-2 viral copies this assay can detect is 138 copies/mL. A negative result does not preclude SARS-Cov-2 infection and should not be used as the sole basis for treatment or other patient management decisions. A negative result may occur with  improper specimen collection/handling, submission of specimen other than nasopharyngeal swab, presence of viral mutation(s) within the areas targeted by this assay, and inadequate number of viral copies(<138 copies/mL). A negative result must be combined with clinical observations, patient history, and epidemiological information. The expected result is  Negative.  Fact Sheet for Patients:  EntrepreneurPulse.com.au  Fact Sheet for Healthcare Providers:  IncredibleEmployment.be  This test is no t yet approved or cleared by the [place] FDA and  has been authorized for detection and/or diagnosis of SARS-CoV-2 by FDA under an Emergency Use Authorization (EUA). This EUA will remain  in effect (meaning this test can be used) for the duration of the COVID-19 declaration under Section 564(b)(1) of the Act, 21 U.S.C.section 360bbb-3(b)(1), unless the authorization is terminated  or revoked sooner.       Influenza A by PCR NEGATIVE NEGATIVE Final   Influenza B by PCR NEGATIVE NEGATIVE Final    Comment: (NOTE) The Xpert Xpress SARS-CoV-2/FLU/RSV plus assay is intended as an aid in the diagnosis of influenza from Nasopharyngeal swab specimens and should not be used as a sole basis for treatment. Nasal washings and aspirates are unacceptable for Xpert Xpress SARS-CoV-2/FLU/RSV testing.  Fact Sheet for Patients: EntrepreneurPulse.com.au  Fact Sheet  for Healthcare Providers: IncredibleEmployment.be  This test is not yet approved or cleared by the [place] and has been authorized for detection and/or diagnosis of SARS-CoV-2 by FDA under an Emergency Use Authorization (EUA). This EUA will remain in effect (meaning this test can be used) for the duration of the COVID-19 declaration under Section 564(b)(1) of the Act, 21 U.S.C. section 360bbb-3(b)(1), unless the authorization is terminated or revoked.  Performed at [facility], [place] [address] [place], [place] [address]           Radiology Studies: DG Abd Portable 1V  Result Date: 08/03/2021 CLINICAL DATA:  Constipation EXAM: PORTABLE ABDOMEN - 1 VIEW COMPARISON:  CT abdomen pelvis 09/18/2011 FINDINGS: Markedly limited evaluation due to overlapping osseous structures and  overlying soft tissues. The bowel gas pattern is normal. Stool noted within the rectosigmoid colon. Calcified right upper quadrant 8 mm nodule consistent with previously identified likely calcified lymph node within right upper quadrant. Otherwise no radio-opaque calculi or other significant radiographic abnormality are seen. Multilevel degenerative changes of the spine. IMPRESSION: Nonobstructive bowel gas pattern.  No definite constipation. Electronically Signed   By: Iven Finn M.D.   On: 08/03/2021 15:57        Scheduled Meds:  carbidopa-levodopa  1 tablet Oral QHS   carbidopa-levodopa  2 tablet Oral Q24H   carbidopa-levodopa  2.5 tablet Oral 2 times per day   carbidopa-levodopa  3 tablet Oral Q24H   donepezil  10 mg Oral QHS   enoxaparin (LOVENOX) injection  40 mg Subcutaneous Daily   influenza vaccine adjuvanted  0.5 mL Intramuscular Tomorrow-1000   mirabegron ER  25 mg Oral Daily   polyethylene glycol  17 g Oral BID   QUEtiapine  25 mg Oral Daily   And   QUEtiapine  50 mg Oral QHS   senna-docusate  1 tablet Oral BID   sodium chloride flush  3 mL Intravenous Q12H   tamsulosin  0.4 mg Oral BID   vitamin B-12  1,000 mcg Oral Daily   Continuous Infusions:   LOS: 7 days        Hosie Poisson, MD [facility]   To contact the attending provider between 7A-7P or the covering provider during after hours 7P-7A, please log into the web site [email or web address] and access using universal [place] password for that web site. If you do not have the password, please call the hospital operator.  08/04/2021, 2:38 PM

## 2021-08-04 NOTE — TOC Progression Note (Signed)
Transition of Care [facility]) - Progression Note    Patient Details  Name: Adam Cordova MRN: [id number] Date of Birth: 04-22-1956  [facility]) CM/SW [place], [place] Phone Number: 08/04/2021, 3:41 PM  Clinical Narrative:    CSW updated by HTA that pt has been approved for SNF and EMS transport. SNF auth ID is [id number] and EMS auth ID is [id number]. CSW spoke to [place] at the [facility] who states they cannot accept pt today but should be able to take him tomorrow. CSW to leave handoff for oncoming TOC member.   Expected Discharge Plan: [place] Barriers to Discharge: Continued Medical Work up, SNF Pending bed offer  Expected Discharge Plan and Services Expected Discharge Plan: [place] In-house Referral: Clinical Social Work   Post Acute Care Choice: [place] Living arrangements for the past 2 months: Single Family Home                                       Social Determinants of Health (SDOH) Interventions    Readmission Risk Interventions No flowsheet data found.

## 2021-08-05 LAB — GLUCOSE, CAPILLARY: Glucose-Capillary: 94 mg/dL (ref 70–99)

## 2021-08-05 LAB — CBC
HCT: 43 % (ref 39.0–52.0)
Hemoglobin: 13.8 g/dL (ref 13.0–17.0)
MCH: 26.6 pg (ref 26.0–34.0)
MCHC: 32.1 g/dL (ref 30.0–36.0)
MCV: 82.9 fL (ref 80.0–100.0)
Platelets: 305 10*3/uL (ref 150–400)
RBC: 5.19 MIL/uL (ref 4.22–5.81)
RDW: 14.3 % (ref 11.5–15.5)
WBC: 7.4 10*3/uL (ref 4.0–10.5)
nRBC: 0 % (ref 0.0–0.2)

## 2021-08-05 LAB — BASIC METABOLIC PANEL
Anion gap: 7 (ref 5–15)
BUN: 21 mg/dL (ref 8–23)
CO2: 24 mmol/L (ref 22–32)
Calcium: 8.6 mg/dL — ABNORMAL LOW (ref 8.9–10.3)
Chloride: 107 mmol/L (ref 98–111)
Creatinine, Ser: 0.56 mg/dL — ABNORMAL LOW (ref 0.61–1.24)
GFR, Estimated: >60 mL/min (ref >60)
Glucose, Bld: 89 mg/dL (ref 70–99)
Potassium: 3.7 mmol/L (ref 3.5–5.1)
Sodium: 138 mmol/L (ref 135–145)

## 2021-08-05 MED ORDER — ACETAMINOPHEN 325 MG PO TABS
650.0000 mg | ORAL_TABLET | Freq: Four times a day (QID) | ORAL | Status: DC | PRN
  Administered 2021-08-05: 01:00:00 650 mg via ORAL
  Filled 2021-08-05: qty 2

## 2021-08-05 NOTE — Progress Notes (Signed)
PROGRESS NOTE    Adam Cordova  EOF:[id number] DOB: 1956/01/05 DOA: 07/27/2021 PCP: [name], MD    Chief Complaint  Patient presents with   Loss of Consciousness    Brief Narrative:  Adam Cordova is a 65 y.o. male with medical history significant of Parkinson's disease, dementia, narcolepsy, BPH. Presenting with syncopal episode. History from wife at bedside. He was on his couch this morning. He got up and moved to the kitchen. As he entered the kitchen, he collapsed onto his wife. She was able to catch him and rest him on a stool. He laid his head on her shoulder and "was out" for at least 5 minutes. There was no tonic-clonic movement, tongue swallowing, foaming at the mouth, or incontinence. When he came to, he was a little groggy. EMS was called. Further work up revealed its probably from progression of parkinson's disease.  Therapy eval recommending SNF.     Assessment & Plan:   Principal Problem:   Syncope Active Problems:   BPH with obstruction/lower urinary tract symptoms   Parkinson's disease (HCC)   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HCC)   Gait disturbance   NSVT (nonsustained ventricular tachycardia)   Leukocytosis   Syncope:  - sec to neurogenic orthostatic hypotension/ autonomic dysfunction from progressive Parkinson's disease.  -CT negative.  - d/c EEG.  - pt seen in consultation by cardiology who feel syncope likely in the setting of parkinson's disease. No structural heart disease noted on echo.  -Flomax and anti-Parkinson's agents and Aricept could likely be contributing however wife does not feel that patient can go without his medications at this time. -pt comfortable , no new complaints today.   Non sustained V TACH: - cardiology on board and recommended event monitor on discharge.  - no events overnight.    Parkinson's diease/ dementia:  - progressive and worsening.  - recommend outpatient follow up with  neurology and palliative care.  - deconditioned, therapy eval recommending SNF.  - currently waiting for bed at SNF. Possible d.c tomorrow.    BPH Resume flomax.   Falls sec to Progressive parkinson's disease.  None this admission.    Leukocytosis.  Resolved. Unclear etiology.   Constipation:  Resolved.  - started on stool softeners and dulcolax suppository and ordered abd x ray. Abd x ray is nonobstructive gas pattern.    DVT prophylaxis: (Lovenox) Code Status: DNR Family Communication: none at bedside.  Disposition:   Status is: Inpatient  Remains inpatient appropriate because: unsafe d/c plan.      Consultants:  Palliative care.   Procedures: none.   Antimicrobials: none.   Subjective: BM this am.    Objective: Vitals:   08/04/21 2138 08/05/21 0458 08/05/21 0546 08/05/21 1352  BP: (!) 169/102  (!) 138/95 94/83  Pulse: 62  73 83  Resp: 20  20 19   Temp: 97.9 F (36.6 C)  97.7 F (36.5 C) 98.1 F (36.7 C)  TempSrc: Oral  Oral Oral  SpO2: 96%  92% 93%  Weight:  71.1 kg    Height:        Intake/Output Summary (Last 24 hours) at 08/05/2021 1412 Last data filed at 08/05/2021 1100 Gross per 24 hour  Intake 120 ml  Output 500 ml  Net -380 ml    Filed Weights   08/03/21 0508 08/04/21 0500 08/05/21 0458  Weight: 72.1 kg 73.9 kg 71.1 kg    Examination:  General exam: Appears calm and comfortable  Respiratory  system: Clear to auscultation. Respiratory effort normal. Cardiovascular system: S1 & S2 heard, RRR. No JVD,  No pedal edema. Gastrointestinal system: Abdomen is nondistended, soft and nontender. Normal bowel sounds heard. Central nervous system: Alert and oriented to person only.  Extremities: Symmetric 5 x 5 power. Skin: No rashes, lesions or ulcers Psychiatry: Mood & affect appropriate.        Data Reviewed: I have personally reviewed following labs and imaging studies  CBC: Recent Labs  Lab 07/30/21 0601 07/31/21 0500  08/05/21 0540  WBC 7.3 11.9* 7.4  NEUTROABS 5.1  --   --   HGB 13.5 13.8 13.8  HCT 39.8 42.6 43.0  MCV 85.8 84.0 82.9  PLT 230 242 305     Basic Metabolic Panel: Recent Labs  Lab 07/30/21 0508 07/31/21 0500 08/01/21 0503 08/05/21 0540  NA 141 138 139 138  K 3.7 4.1 4.5 3.7  CL 112* 111 109 107  CO2 24 20* 22 24  GLUCOSE 89 91 84 89  BUN 16 12 13 21   CREATININE 0.61 0.79 0.65 0.56*  CALCIUM 8.3* 8.6* 8.6* 8.6*  MG 2.0 2.0 2.0  --      GFR: Estimated Creatinine Clearance: 90.1 mL/min (A) (by C-G formula based on SCr of 0.56 mg/dL (L)).  Liver Function Tests: No results for input(s): AST, ALT, ALKPHOS, BILITOT, PROT, ALBUMIN in the last 168 hours.   CBG: Recent Labs  Lab 08/02/21 0525 08/02/21 2131 08/03/21 0508 08/04/21 0517 08/05/21 0502  GLUCAP 93 80 89 100* 94      Recent Results (from the past 240 hour(s))  Resp Panel by RT-PCR (Flu A&B, Covid) Nasopharyngeal Swab     Status: None   Collection Time: 07/27/21 11:31 AM   Specimen: Nasopharyngeal Swab; Nasopharyngeal(NP) swabs in vial transport medium  Result Value Ref Range Status   SARS Coronavirus 2 by RT PCR NEGATIVE NEGATIVE Final    Comment: (NOTE) SARS-CoV-2 target nucleic acids are NOT DETECTED.  The SARS-CoV-2 RNA is generally detectable in upper respiratory specimens during the acute phase of infection. The lowest concentration of SARS-CoV-2 viral copies this assay can detect is 138 copies/mL. A negative result does not preclude SARS-Cov-2 infection and should not be used as the sole basis for treatment or other patient management decisions. A negative result may occur with  improper specimen collection/handling, submission of specimen other than nasopharyngeal swab, presence of viral mutation(s) within the areas targeted by this assay, and inadequate number of viral copies(<138 copies/mL). A negative result must be combined with clinical observations, patient history, and  epidemiological information. The expected result is Negative.  Fact Sheet for Patients:  EntrepreneurPulse.com.au  Fact Sheet for Healthcare Providers:  IncredibleEmployment.be  This test is no t yet approved or cleared by the [place] FDA and  has been authorized for detection and/or diagnosis of SARS-CoV-2 by FDA under an Emergency Use Authorization (EUA). This EUA will remain  in effect (meaning this test can be used) for the duration of the COVID-19 declaration under Section 564(b)(1) of the Act, 21 U.S.C.section 360bbb-3(b)(1), unless the authorization is terminated  or revoked sooner.       Influenza A by PCR NEGATIVE NEGATIVE Final   Influenza B by PCR NEGATIVE NEGATIVE Final    Comment: (NOTE) The Xpert Xpress SARS-CoV-2/FLU/RSV plus assay is intended as an aid in the diagnosis of influenza from Nasopharyngeal swab specimens and should not be used as a sole basis for treatment. Nasal washings and aspirates are unacceptable for  Xpert Xpress SARS-CoV-2/FLU/RSV testing.  Fact Sheet for Patients: EntrepreneurPulse.com.au  Fact Sheet for Healthcare Providers: IncredibleEmployment.be  This test is not yet approved or cleared by the [place] FDA and has been authorized for detection and/or diagnosis of SARS-CoV-2 by FDA under an Emergency Use Authorization (EUA). This EUA will remain in effect (meaning this test can be used) for the duration of the COVID-19 declaration under Section 564(b)(1) of the Act, 21 U.S.C. section 360bbb-3(b)(1), unless the authorization is terminated or revoked.  Performed at [facility], [place] [address], [place], [place] [address]           Radiology Studies: DG Abd Portable 1V  Result Date: 08/03/2021 CLINICAL DATA:  Constipation EXAM: PORTABLE ABDOMEN - 1 VIEW COMPARISON:  CT abdomen pelvis 09/18/2011 FINDINGS: Markedly limited  evaluation due to overlapping osseous structures and overlying soft tissues. The bowel gas pattern is normal. Stool noted within the rectosigmoid colon. Calcified right upper quadrant 8 mm nodule consistent with previously identified likely calcified lymph node within right upper quadrant. Otherwise no radio-opaque calculi or other significant radiographic abnormality are seen. Multilevel degenerative changes of the spine. IMPRESSION: Nonobstructive bowel gas pattern.  No definite constipation. Electronically Signed   By: Iven Finn M.D.   On: 08/03/2021 15:57        Scheduled Meds:  carbidopa-levodopa  1 tablet Oral QHS   carbidopa-levodopa  2 tablet Oral Q24H   carbidopa-levodopa  2.5 tablet Oral 2 times per day   carbidopa-levodopa  3 tablet Oral Q24H   donepezil  10 mg Oral QHS   enoxaparin (LOVENOX) injection  40 mg Subcutaneous Daily   influenza vaccine adjuvanted  0.5 mL Intramuscular Tomorrow-1000   mirabegron ER  25 mg Oral Daily   polyethylene glycol  17 g Oral BID   QUEtiapine  25 mg Oral Daily   And   QUEtiapine  50 mg Oral QHS   senna-docusate  1 tablet Oral BID   sodium chloride flush  3 mL Intravenous Q12H   tamsulosin  0.4 mg Oral BID   vitamin B-12  1,000 mcg Oral Daily   Continuous Infusions:   LOS: 8 days        Hosie Poisson, MD [facility]   To contact the attending provider between 7A-7P or the covering provider during after hours 7P-7A, please log into the web site [email or web address] and access using universal [place] password for that web site. If you do not have the password, please call the hospital operator.  08/05/2021, 2:12 PM

## 2021-08-06 DIAGNOSIS — R55 Syncope and collapse: Secondary | ICD-10-CM | POA: Diagnosis not present

## 2021-08-06 DIAGNOSIS — R1310 Dysphagia, unspecified: Secondary | ICD-10-CM | POA: Diagnosis not present

## 2021-08-06 DIAGNOSIS — Z7401 Bed confinement status: Secondary | ICD-10-CM | POA: Diagnosis not present

## 2021-08-06 DIAGNOSIS — I4729 Other ventricular tachycardia: Secondary | ICD-10-CM | POA: Diagnosis not present

## 2021-08-06 DIAGNOSIS — G909 Disorder of the autonomic nervous system, unspecified: Secondary | ICD-10-CM | POA: Diagnosis not present

## 2021-08-06 DIAGNOSIS — N400 Enlarged prostate without lower urinary tract symptoms: Secondary | ICD-10-CM | POA: Diagnosis not present

## 2021-08-06 DIAGNOSIS — D72829 Elevated white blood cell count, unspecified: Secondary | ICD-10-CM | POA: Diagnosis not present

## 2021-08-06 DIAGNOSIS — F0280 Dementia in other diseases classified elsewhere without behavioral disturbance: Secondary | ICD-10-CM | POA: Diagnosis not present

## 2021-08-06 DIAGNOSIS — M6281 Muscle weakness (generalized): Secondary | ICD-10-CM | POA: Diagnosis not present

## 2021-08-06 DIAGNOSIS — R278 Other lack of coordination: Secondary | ICD-10-CM | POA: Diagnosis not present

## 2021-08-06 DIAGNOSIS — R269 Unspecified abnormalities of gait and mobility: Secondary | ICD-10-CM | POA: Diagnosis not present

## 2021-08-06 DIAGNOSIS — I10 Essential (primary) hypertension: Secondary | ICD-10-CM | POA: Diagnosis not present

## 2021-08-06 DIAGNOSIS — R262 Difficulty in walking, not elsewhere classified: Secondary | ICD-10-CM | POA: Diagnosis not present

## 2021-08-06 DIAGNOSIS — K5900 Constipation, unspecified: Secondary | ICD-10-CM

## 2021-08-06 DIAGNOSIS — R29898 Other symptoms and signs involving the musculoskeletal system: Secondary | ICD-10-CM | POA: Diagnosis not present

## 2021-08-06 DIAGNOSIS — M2550 Pain in unspecified joint: Secondary | ICD-10-CM | POA: Diagnosis not present

## 2021-08-06 DIAGNOSIS — R41841 Cognitive communication deficit: Secondary | ICD-10-CM | POA: Diagnosis not present

## 2021-08-06 DIAGNOSIS — E46 Unspecified protein-calorie malnutrition: Secondary | ICD-10-CM | POA: Diagnosis not present

## 2021-08-06 DIAGNOSIS — F03A18 Unspecified dementia, mild, with other behavioral disturbance: Secondary | ICD-10-CM | POA: Diagnosis not present

## 2021-08-06 DIAGNOSIS — G47419 Narcolepsy without cataplexy: Secondary | ICD-10-CM | POA: Diagnosis not present

## 2021-08-06 DIAGNOSIS — G20 Parkinson's disease: Secondary | ICD-10-CM | POA: Diagnosis not present

## 2021-08-06 DIAGNOSIS — N401 Enlarged prostate with lower urinary tract symptoms: Secondary | ICD-10-CM | POA: Diagnosis not present

## 2021-08-06 LAB — SARS CORONAVIRUS 2 (TAT 6-24 HRS): SARS Coronavirus 2: NEGATIVE

## 2021-08-06 LAB — GLUCOSE, CAPILLARY: Glucose-Capillary: 98 mg/dL (ref 70–99)

## 2021-08-06 LAB — RESP PANEL BY RT-PCR (FLU A&B, COVID) ARPGX2
Influenza A by PCR: NEGATIVE
Influenza B by PCR: NEGATIVE
SARS Coronavirus 2 by RT PCR: NEGATIVE

## 2021-08-06 MED ORDER — SENNOSIDES-DOCUSATE SODIUM 8.6-50 MG PO TABS: 1.0000 | ORAL_TABLET | Freq: Two times a day (BID) | ORAL | Status: AC

## 2021-08-06 MED ORDER — POLYETHYLENE GLYCOL 3350 17 G PO PACK: 17.0000 g | PACK | Freq: Two times a day (BID) | ORAL | 0 refills | Status: AC

## 2021-08-06 NOTE — Discharge Summary (Signed)
Physician Discharge Summary  Adam Cordova DGU:[id number] DOB: 02-Apr-1956 DOA: 07/27/2021  PCP: [name], MD  Admit date: 07/27/2021 Discharge date: 08/06/2021  Admitted From: Home.  Disposition:  SNF  Recommendations for Outpatient Follow-up:  Follow up with PCP in 1-2 weeks Please obtain BMP/CBC in one week   Discharge Condition: stable.  CODE STATUS:full code.  Diet recommendation: Heart Healthy   Brief/Interim Summary: Adam Cordova is a 65 y.o. male with medical history significant of Parkinson's disease, dementia, narcolepsy, BPH. Presenting with syncopal episode. History from wife at bedside. He was on his couch this morning. He got up and moved to the kitchen. As he entered the kitchen, he collapsed onto his wife. She was able to catch him and rest him on a stool. He laid his head on her shoulder and "was out" for at least 5 minutes. There was no tonic-clonic movement, tongue swallowing, foaming at the mouth, or incontinence. When he came to, he was a little groggy. EMS was called. Further work up revealed its probably from progression of parkinson's disease.  Therapy eval recommending SNF.     Discharge Diagnoses:  Principal Problem:   Syncope Active Problems:   BPH with obstruction/lower urinary tract symptoms   Parkinson's disease ([place])   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HCC)   Gait disturbance   NSVT (nonsustained ventricular tachycardia)   Leukocytosis    Syncope:  - sec to neurogenic orthostatic hypotension/ autonomic dysfunction from progressive Parkinson's disease.  -CT negative.  - d/c EEG.  - pt seen in consultation by cardiology who feel syncope likely in the setting of parkinson's disease. No structural heart disease noted on echo.  -Flomax and anti-Parkinson's agents and Aricept could likely be contributing however wife does not feel that patient can go without his medications at this time. -pt comfortable , no new  complaints today.    Non sustained V TACH: - cardiology on board and recommended event monitor on discharge.  - no events overnight.      Parkinson's diease/ dementia:  - progressive and worsening.  - recommend outpatient follow up with neurology and palliative care.  - deconditioned, therapy eval recommending SNF.  - currently waiting for bed at SNF.      BPH Resume flomax.    Falls sec to Progressive parkinson's disease.  None this admission.      Leukocytosis.  Resolved. Unclear etiology.    Constipation:  Resolved.  - started on stool softeners and dulcolax suppository and ordered abd x ray. Abd x ray is nonobstructive gas pattern.     Discharge Instructions  Discharge Instructions     Diet - low sodium heart healthy   Complete by: As directed    Discharge instructions   Complete by: As directed    Please follow up with PCP In 1 to 2 weeks.   Increase activity slowly   Complete by: As directed       Allergies as of 08/06/2021       Reactions   Bee Venom Anaphylaxis   Levaquin [levofloxacin Hemihydrate] Anaphylaxis, Swelling   Sertraline Diarrhea, Nausea Only        Medication List     TAKE these medications    carbidopa-levodopa 25-100 MG tablet Commonly known as: SINEMET IR 3 in the AM, 2 at 10am, 2 at 2pm, 2 at 6pm What changed:  how much to take how to take this when to take this additional instructions   carbidopa-levodopa 50-200 MG  tablet Commonly known as: SINEMET CR Take 1 tablet by mouth at bedtime. What changed: additional instructions   donepezil 10 MG tablet Commonly known as: ARICEPT Take 1 tablet (10 mg total) by mouth at bedtime.   EPINEPHrine 0.3 mg/0.3 mL Soaj injection Commonly known as: EPI-PEN Inject 0.3 mg into the muscle as needed for anaphylaxis.   Myrbetriq 25 MG Tb24 tablet Generic drug: mirabegron ER Take 25 mg by mouth daily.   polyethylene glycol 17 g packet Commonly known as: MIRALAX / GLYCOLAX Take  17 g by mouth 2 (two) times daily.   QUEtiapine 25 MG tablet Commonly known as: SEROQUEL 1 in the AM, 2 at bed for a week and then 2 po bid as directed What changed:  how much to take how to take this when to take this additional instructions   senna-docusate 8.6-50 MG tablet Commonly known as: Senokot-S Take 1 tablet by mouth 2 (two) times daily.   tamsulosin 0.4 MG Caps capsule Commonly known as: FLOMAX Take 0.4 mg by mouth 2 (two) times daily.   vitamin B-12 1000 MCG tablet Commonly known as: CYANOCOBALAMIN Take 1,000 mcg by mouth daily.        Contact information for follow-up providers     Donato Heinz, MD. Go on 09/13/2021.   Specialties: Cardiology, Radiology Why: Appt with Dr. Gardiner Rhyme 09/13/21 at 2 PM Contact information: [address] [place] [address] [phone]         [name], MD. Schedule an appointment as soon as possible for a visit in 1 week(s).   Specialty: Family Medicine Contact information: [address], [place] [address] [phone]         Donato Heinz, MD .   Specialties: Cardiology, Radiology Contact information: [address] [place] [address] [phone]              Contact information for after-discharge care     [facility] .   Service: Skilled Nursing Contact information: [address] [address] [phone]                    Allergies  Allergen Reactions   Bee Venom Anaphylaxis   Levaquin [Levofloxacin Hemihydrate] Anaphylaxis and Swelling   Sertraline Diarrhea and Nausea Only    Consultations: Cardiology.    Procedures/Studies: CT Head Wo Contrast  Result Date: 07/27/2021 CLINICAL DATA:  Altered mental status EXAM: CT HEAD WITHOUT CONTRAST TECHNIQUE: Contiguous axial images were obtained from the base of the skull  through the vertex without intravenous contrast. COMPARISON:  05/15/2021 FINDINGS: Brain: There is atrophy and chronic small vessel disease changes. No acute intracranial abnormality. Specifically, no hemorrhage, hydrocephalus, mass lesion, acute infarction, or significant intracranial injury. Vascular: No hyperdense vessel or unexpected calcification. Skull: No acute calvarial abnormality. Sinuses/Orbits: No acute findings Other: None IMPRESSION: Atrophy, chronic microvascular disease. No acute intracranial abnormality. Electronically Signed   By: Rolm Baptise M.D.   On: 07/27/2021 11:42   DG CHEST PORT 1 VIEW  Result Date: 07/29/2021 CLINICAL DATA:  Leukocytosis, confusion, Parkinson's EXAM: PORTABLE CHEST 1 VIEW COMPARISON:  Portable exam 1107 hours compared to 07/27/2021 FINDINGS: Upper normal size of cardiac silhouette, likely accentuated by kyphosis. Mediastinal contours and pulmonary vascularity normal. Bibasilar atelectasis. No infiltrate, pleural effusion, or pneumothorax. IMPRESSION: Mild bibasilar atelectasis. Electronically Signed   By: Crist Infante.D.  On: 07/29/2021 13:34   DG Chest Portable 1 View  Result Date: 07/27/2021 CLINICAL DATA:  Evaluate for pneumonia EXAM: PORTABLE CHEST 1 VIEW COMPARISON:  July 29, 2020 FINDINGS: Right costophrenic angles excluded from the field of view. The heart size and mediastinal contours are within normal limits. Bibasilar linear opacities. Lungs otherwise clear. The visualized skeletal structures are unremarkable. IMPRESSION: Bibasilar linear opacities, likely due to scarring or atelectasis. Electronically Signed   By: Yetta Glassman M.D.   On: 07/27/2021 12:36   DG Abd Portable 1V  Result Date: 08/03/2021 CLINICAL DATA:  Constipation EXAM: PORTABLE ABDOMEN - 1 VIEW COMPARISON:  CT abdomen pelvis 09/18/2011 FINDINGS: Markedly limited evaluation due to overlapping osseous structures and overlying soft tissues. The bowel gas pattern is normal.  Stool noted within the rectosigmoid colon. Calcified right upper quadrant 8 mm nodule consistent with previously identified likely calcified lymph node within right upper quadrant. Otherwise no radio-opaque calculi or other significant radiographic abnormality are seen. Multilevel degenerative changes of the spine. IMPRESSION: Nonobstructive bowel gas pattern.  No definite constipation. Electronically Signed   By: Iven Finn M.D.   On: 08/03/2021 15:57   ECHOCARDIOGRAM COMPLETE  Result Date: 07/28/2021    ECHOCARDIOGRAM REPORT   Patient Name:   Adam Cordova Date of Exam: 07/28/2021 Medical Rec #:  [id number]     Height:       70.0 in Accession #:    [id number]    Weight:       167.1 lb Date of Birth:  [birth year]     BSA:          1.934 m Patient Age:    60 years      BP:           105/75 mmHg Patient Gender: M             HR:           74 bpm. Exam Location:  Inpatient Procedure: 2D Echo, Cardiac Doppler and Color Doppler Indications:    Syncope  History:        Patient has no prior history of Echocardiogram examinations.  Sonographer:    Glo Herring Referring Phys: [id number] [place]  1. Left ventricular ejection fraction, by estimation, is 60 to 65%. The left ventricle has normal function. Left ventricular endocardial border not optimally defined to evaluate regional wall motion. There is mild left ventricular hypertrophy. Left ventricular diastolic parameters are consistent with Grade I diastolic dysfunction (impaired relaxation).  2. Right ventricular systolic function is normal. The right ventricular size is mildly enlarged.  3. Left atrial size was mildly dilated.  4. The mitral valve is grossly normal. No evidence of mitral valve regurgitation. No evidence of mitral stenosis.  5. The aortic valve is tricuspid. Aortic valve regurgitation is not visualized. No aortic stenosis is present.  6. The inferior vena cava is normal in size with greater than 50% respiratory variability,  suggesting right atrial pressure of 3 mmHg. FINDINGS  Left Ventricle: Left ventricular ejection fraction, by estimation, is 60 to 65%. The left ventricle has normal function. Left ventricular endocardial border not optimally defined to evaluate regional wall motion. The left ventricular internal cavity size was normal in size. There is mild left ventricular hypertrophy. Left ventricular diastolic parameters are consistent with Grade I diastolic dysfunction (impaired relaxation). Right Ventricle: The right ventricular size is mildly enlarged. No increase in right ventricular wall thickness. Right ventricular systolic function is normal. Left Atrium: Left  atrial size was mildly dilated. Right Atrium: Right atrial size was not well visualized. Pericardium: There is no evidence of pericardial effusion. Mitral Valve: The mitral valve is grossly normal. Mild mitral annular calcification. No evidence of mitral valve regurgitation. No evidence of mitral valve stenosis. Tricuspid Valve: The tricuspid valve is normal in structure. Tricuspid valve regurgitation is trivial. No evidence of tricuspid stenosis. Aortic Valve: The aortic valve is tricuspid. Aortic valve regurgitation is not visualized. No aortic stenosis is present. Aortic valve mean gradient measures 3.0 mmHg. Aortic valve peak gradient measures 5.7 mmHg. Aortic valve area, by VTI measures 2.43 cm. Pulmonic Valve: The pulmonic valve was normal in structure. Pulmonic valve regurgitation is trivial. No evidence of pulmonic stenosis. Aorta: The aortic root is normal in size and structure. Venous: The inferior vena cava is normal in size with greater than 50% respiratory variability, suggesting right atrial pressure of 3 mmHg. IAS/Shunts: No atrial level shunt detected by color flow Doppler.  LEFT VENTRICLE PLAX 2D LVIDd:         5.00 cm      Diastology LVIDs:         3.20 cm      LV e' medial:    7.62 cm/s LV PW:         1.20 cm      LV E/e' medial:  6.9 LV IVS:         1.20 cm      LV e' lateral:   11.20 cm/s LVOT diam:     2.15 cm      LV E/e' lateral: 4.7 LV SV:         53 LV SV Index:   28 LVOT Area:     3.63 cm  LV Volumes (MOD) LV vol d, MOD A2C: 115.0 ml LV vol d, MOD A4C: 125.0 ml LV vol s, MOD A2C: 51.8 ml LV vol s, MOD A4C: 56.9 ml LV SV MOD A2C:     63.2 ml LV SV MOD A4C:     125.0 ml LV SV MOD BP:      66.7 ml IVC IVC diam: 1.30 cm LEFT ATRIUM             Index LA diam:        3.80 cm 1.96 cm/m LA Vol (A2C):   63.9 ml 33.04 ml/m LA Vol (A4C):   83.8 ml 43.33 ml/m LA Biplane Vol: 77.3 ml 39.97 ml/m  AORTIC VALVE                    PULMONIC VALVE AV Area (Vmax):    2.29 cm     PV Vmax:       0.71 m/s AV Area (Vmean):   2.39 cm     PV Peak grad:  2.0 mmHg AV Area (VTI):     2.43 cm AV Vmax:           119.00 cm/s AV Vmean:          81.700 cm/s AV VTI:            0.220 m AV Peak Grad:      5.7 mmHg AV Mean Grad:      3.0 mmHg LVOT Vmax:         75.10 cm/s LVOT Vmean:        53.700 cm/s LVOT VTI:          0.147 m LVOT/AV VTI ratio: 0.67  AORTA Ao Root diam:  3.70 cm MITRAL VALVE MV Area (PHT): 4.39 cm    SHUNTS MV Decel Time: 173 msec    Systemic VTI:  0.15 m MV E velocity: 52.30 cm/s  Systemic Diam: 2.15 cm MV A velocity: 68.60 cm/s MV E/A ratio:  0.76 Cherlynn Kaiser MD Electronically signed by Cherlynn Kaiser MD Signature Date/Time: 07/28/2021/1:18:22 PM    Final        Subjective: No chest pain or sob.   Discharge Exam: Vitals:   08/05/21 2109 08/06/21 0601  BP: (!) 130/94 130/87  Pulse: 75 73  Resp: 16 20  Temp: 98 F (36.7 C) 98.2 F (36.8 C)  SpO2: 96% 95%   Vitals:   08/05/21 1352 08/05/21 2109 08/06/21 0500 08/06/21 0601  BP: 94/83 (!) 130/94  130/87  Pulse: 83 75  73  Resp: 19 16  20   Temp: 98.1 F (36.7 C) 98 F (36.7 C)  98.2 F (36.8 C)  TempSrc: Oral Oral  Oral  SpO2: 93% 96%  95%  Weight:   70 kg   Height:        General: Pt is alert, awake, not in acute distress Cardiovascular: RRR, S1/S2 +, no rubs, no  gallops Respiratory: CTA bilaterally, no wheezing, no rhonchi Abdominal: Soft, NT, ND, bowel sounds + Extremities: no edema, no cyanosis    The results of significant diagnostics from this hospitalization (including imaging, microbiology, ancillary and laboratory) are listed below for reference.     Microbiology: Recent Results (from the past 240 hour(s))  Resp Panel by RT-PCR (Flu A&B, Covid) Nasopharyngeal Swab     Status: None   Collection Time: 07/27/21 11:31 AM   Specimen: Nasopharyngeal Swab; Nasopharyngeal(NP) swabs in vial transport medium  Result Value Ref [place] Status   SARS Coronavirus 2 by RT PCR NEGATIVE NEGATIVE Final    Comment: (NOTE) SARS-CoV-2 target nucleic acids are NOT DETECTED.  The SARS-CoV-2 RNA is generally detectable in upper respiratory specimens during the acute phase of infection. The lowest concentration of SARS-CoV-2 viral copies this assay can detect is 138 copies/mL. A negative result does not preclude SARS-Cov-2 infection and should not be used as the sole basis for treatment or other patient management decisions. A negative result may occur with  improper specimen collection/handling, submission of specimen other than nasopharyngeal swab, presence of viral mutation(s) within the areas targeted by this assay, and inadequate number of viral copies(<138 copies/mL). A negative result must be combined with clinical observations, patient history, and epidemiological information. The expected result is Negative.  Fact Sheet for Patients:  EntrepreneurPulse.com.au  Fact Sheet for Healthcare Providers:  IncredibleEmployment.be  This test is no t yet approved or cleared by the [place] FDA and  has been authorized for detection and/or diagnosis of SARS-CoV-2 by FDA under an Emergency Use Authorization (EUA). This EUA will remain  in effect (meaning this test can be used) for the duration of the COVID-19  declaration under Section 564(b)(1) of the Act, 21 U.S.C.section 360bbb-3(b)(1), unless the authorization is terminated  or revoked sooner.       Influenza A by PCR NEGATIVE NEGATIVE Final   Influenza B by PCR NEGATIVE NEGATIVE Final    Comment: (NOTE) The Xpert Xpress SARS-CoV-2/FLU/RSV plus assay is intended as an aid in the diagnosis of influenza from Nasopharyngeal swab specimens and should not be used as a sole basis for treatment. Nasal washings and aspirates are unacceptable for Xpert Xpress SARS-CoV-2/FLU/RSV testing.  Fact Sheet for Patients: EntrepreneurPulse.com.au  Fact Sheet for Healthcare  Providers: IncredibleEmployment.be  This test is not yet approved or cleared by the [place] and has been authorized for detection and/or diagnosis of SARS-CoV-2 by FDA under an Emergency Use Authorization (EUA). This EUA will remain in effect (meaning this test can be used) for the duration of the COVID-19 declaration under Section 564(b)(1) of the Act, 21 U.S.C. section 360bbb-3(b)(1), unless the authorization is terminated or revoked.  Performed at [facility], [place] [address] [place], [place] [address]      Labs: BNP (last 3 results) No results for input(s): BNP in the last 8760 hours. Basic Metabolic Panel: Recent Labs  Lab 07/31/21 0500 08/01/21 0503 08/05/21 0540  NA 138 139 138  K 4.1 4.5 3.7  CL 111 109 107  CO2 20* 22 24  GLUCOSE 91 84 89  BUN 12 13 21   CREATININE 0.79 0.65 0.56*  CALCIUM 8.6* 8.6* 8.6*  MG 2.0 2.0  --    Liver Function Tests: No results for input(s): AST, ALT, ALKPHOS, BILITOT, PROT, ALBUMIN in the last 168 hours. No results for input(s): LIPASE, AMYLASE in the last 168 hours. No results for input(s): AMMONIA in the last 168 hours. CBC: Recent Labs  Lab 07/31/21 0500 08/05/21 0540  WBC 11.9* 7.4  HGB 13.8 13.8  HCT 42.6 43.0  MCV 84.0 82.9  PLT 242 305    Cardiac Enzymes: No results for input(s): CKTOTAL, CKMB, CKMBINDEX, TROPONINI in the last 168 hours. BNP: Invalid input(s): POCBNP CBG: Recent Labs  Lab 08/02/21 2131 08/03/21 0508 08/04/21 0517 08/05/21 0502 08/06/21 0603  GLUCAP 80 89 100* 94 98   D-Dimer No results for input(s): DDIMER in the last 72 hours. Hgb A1c No results for input(s): HGBA1C in the last 72 hours. Lipid Profile No results for input(s): CHOL, HDL, LDLCALC, TRIG, CHOLHDL, LDLDIRECT in the last 72 hours. Thyroid function studies No results for input(s): TSH, T4TOTAL, T3FREE, THYROIDAB in the last 72 hours.  Invalid input(s): FREET3 Anemia work up No results for input(s): VITAMINB12, FOLATE, FERRITIN, TIBC, IRON, RETICCTPCT in the last 72 hours. Urinalysis    Component Value Date/Time   COLORURINE YELLOW 07/27/2021 1529   APPEARANCEUR CLEAR 07/27/2021 1529   APPEARANCEUR Clear 12/16/2018 1029   LABSPEC 1.023 07/27/2021 1529   PHURINE 6.0 07/27/2021 1529   GLUCOSEU NEGATIVE 07/27/2021 1529   HGBUR NEGATIVE 07/27/2021 1529   BILIRUBINUR NEGATIVE 07/27/2021 1529   BILIRUBINUR Negative 12/16/2018 1029   KETONESUR 5 (A) 07/27/2021 1529   PROTEINUR NEGATIVE 07/27/2021 1529   UROBILINOGEN 0.2 09/18/2011 1943   NITRITE NEGATIVE 07/27/2021 1529   LEUKOCYTESUR NEGATIVE 07/27/2021 1529   Sepsis Labs Invalid input(s): PROCALCITONIN,  WBC,  LACTICIDVEN Microbiology Recent Results (from the past 240 hour(s))  Resp Panel by RT-PCR (Flu A&B, Covid) Nasopharyngeal Swab     Status: None   Collection Time: 07/27/21 11:31 AM   Specimen: Nasopharyngeal Swab; Nasopharyngeal(NP) swabs in vial transport medium  Result Value Ref [place] Status   SARS Coronavirus 2 by RT PCR NEGATIVE NEGATIVE Final    Comment: (NOTE) SARS-CoV-2 target nucleic acids are NOT DETECTED.  The SARS-CoV-2 RNA is generally detectable in upper respiratory specimens during the acute phase of infection. The lowest concentration of  SARS-CoV-2 viral copies this assay can detect is 138 copies/mL. A negative result does not preclude SARS-Cov-2 infection and should not be used as the sole basis for treatment or other patient management decisions. A negative result may occur with  improper specimen collection/handling, submission of  specimen other than nasopharyngeal swab, presence of viral mutation(s) within the areas targeted by this assay, and inadequate number of viral copies(<138 copies/mL). A negative result must be combined with clinical observations, patient history, and epidemiological information. The expected result is Negative.  Fact Sheet for Patients:  EntrepreneurPulse.com.au  Fact Sheet for Healthcare Providers:  IncredibleEmployment.be  This test is no t yet approved or cleared by the [place] FDA and  has been authorized for detection and/or diagnosis of SARS-CoV-2 by FDA under an Emergency Use Authorization (EUA). This EUA will remain  in effect (meaning this test can be used) for the duration of the COVID-19 declaration under Section 564(b)(1) of the Act, 21 U.S.C.section 360bbb-3(b)(1), unless the authorization is terminated  or revoked sooner.       Influenza A by PCR NEGATIVE NEGATIVE Final   Influenza B by PCR NEGATIVE NEGATIVE Final    Comment: (NOTE) The Xpert Xpress SARS-CoV-2/FLU/RSV plus assay is intended as an aid in the diagnosis of influenza from Nasopharyngeal swab specimens and should not be used as a sole basis for treatment. Nasal washings and aspirates are unacceptable for Xpert Xpress SARS-CoV-2/FLU/RSV testing.  Fact Sheet for Patients: EntrepreneurPulse.com.au  Fact Sheet for Healthcare Providers: IncredibleEmployment.be  This test is not yet approved or cleared by the [place] FDA and has been authorized for detection and/or diagnosis of SARS-CoV-2 by FDA under an Emergency Use  Authorization (EUA). This EUA will remain in effect (meaning this test can be used) for the duration of the COVID-19 declaration under Section 564(b)(1) of the Act, 21 U.S.C. section 360bbb-3(b)(1), unless the authorization is terminated or revoked.  Performed at [facility], [place] [address] [place], [place] [address]      Time coordinating discharge: 36 minutes.   SIGNED:   Hosie Poisson, MD  [facility] 08/06/2021, 10:31 AM

## 2021-08-06 NOTE — TOC Transition Note (Signed)
Transition of Care [facility]) - CM/SW Discharge Note   Patient Details  Name: ZEALAND BOYETT MRN: [id number] Date of Birth: 10-Jan-1956  Transition of Care [facility]) CM/SW Contact:  Trish Mage, LCSW Phone Number: 08/06/2021, 12:06 PM   Clinical Narrative:   Patient who is stable for d/c will transfer to [facility] today.  Family informed. PTAR arranged.  Nursing, please call report to [phone]. TOC  sign off.     Final next level of care: Skilled Nursing Facility Barriers to Discharge: Barriers Resolved   Patient Goals and CMS Choice        Discharge Placement                       Discharge Plan and Services In-house Referral: Clinical Social Work   Post Acute Care Choice: [place]                               Social Determinants of Health (SDOH) Interventions     Readmission Risk Interventions No flowsheet data found.

## 2021-08-06 NOTE — Care Management Important Message (Signed)
Important Message  Patient Details IM Letter placed in Patients room. Name: Adam Cordova MRN: [id number] Date of Birth: 11-04-1955   Medicare Important Message Given:  Yes     Kerin Salen 08/06/2021, 11:55 AM

## 2021-08-10 DIAGNOSIS — E46 Unspecified protein-calorie malnutrition: Secondary | ICD-10-CM | POA: Diagnosis not present

## 2021-08-10 DIAGNOSIS — F03A18 Unspecified dementia, mild, with other behavioral disturbance: Secondary | ICD-10-CM | POA: Diagnosis not present

## 2021-08-10 DIAGNOSIS — G20 Parkinson's disease: Secondary | ICD-10-CM | POA: Diagnosis not present

## 2021-08-10 DIAGNOSIS — N400 Enlarged prostate without lower urinary tract symptoms: Secondary | ICD-10-CM | POA: Diagnosis not present

## 2021-08-10 DIAGNOSIS — G47419 Narcolepsy without cataplexy: Secondary | ICD-10-CM | POA: Diagnosis not present

## 2021-08-11 DIAGNOSIS — I10 Essential (primary) hypertension: Secondary | ICD-10-CM | POA: Diagnosis not present

## 2021-08-14 DIAGNOSIS — K5900 Constipation, unspecified: Secondary | ICD-10-CM | POA: Diagnosis not present

## 2021-08-14 DIAGNOSIS — G909 Disorder of the autonomic nervous system, unspecified: Secondary | ICD-10-CM | POA: Diagnosis not present

## 2021-08-14 DIAGNOSIS — R55 Syncope and collapse: Secondary | ICD-10-CM | POA: Diagnosis not present

## 2021-08-14 DIAGNOSIS — G20 Parkinson's disease: Secondary | ICD-10-CM | POA: Diagnosis not present

## 2021-08-14 DIAGNOSIS — E46 Unspecified protein-calorie malnutrition: Secondary | ICD-10-CM | POA: Diagnosis not present

## 2021-08-14 DIAGNOSIS — F03A18 Unspecified dementia, mild, with other behavioral disturbance: Secondary | ICD-10-CM | POA: Diagnosis not present

## 2021-08-14 DIAGNOSIS — N400 Enlarged prostate without lower urinary tract symptoms: Secondary | ICD-10-CM | POA: Diagnosis not present

## 2021-08-14 DIAGNOSIS — I4729 Other ventricular tachycardia: Secondary | ICD-10-CM | POA: Diagnosis not present

## 2021-08-15 ENCOUNTER — Other Ambulatory Visit: Payer: Self-pay | Admitting: Neurology

## 2021-08-15 DIAGNOSIS — G20 Parkinson's disease: Secondary | ICD-10-CM

## 2021-08-15 DIAGNOSIS — R441 Visual hallucinations: Secondary | ICD-10-CM

## 2021-08-15 DIAGNOSIS — F0280 Dementia in other diseases classified elsewhere without behavioral disturbance: Secondary | ICD-10-CM

## 2021-08-16 ENCOUNTER — Other Ambulatory Visit: Payer: Self-pay

## 2021-08-16 ENCOUNTER — Non-Acute Institutional Stay: Payer: PPO | Admitting: *Deleted

## 2021-08-16 ENCOUNTER — Other Ambulatory Visit: Payer: PPO

## 2021-08-16 VITALS — BP 118/89 | HR 79 | Temp 97.8°F | Resp 16

## 2021-08-16 DIAGNOSIS — Z515 Encounter for palliative care: Secondary | ICD-10-CM

## 2021-08-16 NOTE — Progress Notes (Signed)
[facility] COMMUNITY PALLIATIVE CARE RN NOTE  PATIENT NAME: Adam Cordova DOB: 28-Sep-1955 MRN: [id number]  PRIMARY CARE PROVIDER: [name], MD  RESPONSIBLE PARTY: [name] (wife) Acct ID - Guarantor Home Phone Work Phone Relationship Acct Type  [id number] Adam Leech832 [phone]  Self P/F     [address] [place], [place], [place] [address]   Covid-19 Pre-screening Negative  PLAN OF CARE and INTERVENTION:  ADVANCE CARE PLANNING/GOALS OF CARE: Goal is for patient to get stronger and complete therapy. He has a DNR. PATIENT/CAREGIVER EDUCATION: Symptom management, safe mobility/transfers, fall prevention DISEASE STATUS: Joint visit made with LCSW, Olene Craven and Damaris Hippo, NP. Patient was recently hospitalized from 07/27/21 to 08/06/21 after having a syncope episode in his home. He has a diagnosis of Parkinson's disease. He was discharged from [facility] to [facility] for therapy. He has PT/OT/ST. Upon arrival, patient is sitting up in his bed awake and alert. He is able to answer simple questions. He has moments where he has a blank stare and will not answer. Staff deny any visual hallucinations since he has been there. ST reports that patient remains on a regular diet with thin liquids. She says he appears to be swallowing ok. He does have a gurgle, but occurs even when he is not eating. He feeds himself using weighted utensils. He ate 50% of lunch is has been averaging between 50-100% of most meals. He is dependent with bathing, dressing, toileting, transfers and ambulation. ST says that with PT, they are ambulating him using his Cude but his legs do give out suddenly at times. He was trying to transfer to his wheelchair this week, and slid to the floor. He has had some falls since he has been in rehab. He has some scrapes and bruising noted to both shins. He denies any pain. I called and spoke with his wife Adam Cordova. She says that his skilled days end on  08/26/21. She will pick him up from the facility on 08/25/21 to spend the holiday at home. He will then be transferred to [place] to stay. Adam Cordova says that she is no longer able to care for him at home. He requires 2 person assistance and requires a higher level of care.   HISTORY OF PRESENT ILLNESS:  This is a 65 yo male with a diagnosis of Parkinson's disease. He has a h/o dementia, BPH, narcolepsy, gait disturbance and depression. Palliative care team continues to follow patient for additional support, goals of care and complex decision making.      CODE STATUS: DNR ADVANCED DIRECTIVES: Y MOST FORM: no PPS: 30%   PHYSICAL EXAM:   VITALS: Today's Vitals   08/16/21 1341  BP: 118/89  Pulse: 79  Resp: 16  Temp: 97.8 F (36.6 C)  TempSrc: Temporal  SpO2: 96%  PainSc: 0-No pain    LUNGS: clear to auscultation  CARDIAC: Cor RRR EXTREMITIES: No edema SKIN:  Multiple bruises and scrapes to lower extremities; abrasion noted to left eyebrow   NEURO:  Alert and oriented to person/place, intermittent confusion, forgetful, increased generalized weakness, ambulatory w/Miedema and assistance, high fall risk   (Duration of visit and documentation 45 minutes)    Daryl Eastern, RN BSN

## 2021-08-17 NOTE — Progress Notes (Signed)
COMMUNITY PALLIATIVE CARE SW NOTE  PATIENT NAME: Adam Cordova DOB: 1956-07-27 MRN: [id number]  PRIMARY CARE PROVIDER: [name], MD  RESPONSIBLE PARTY:  Acct ID - Guarantor Home Phone Work Phone Relationship Acct Type  [id number] Idelle [phone]  Self P/F     [address], [place], [place] [address]     PLAN OF CARE and INTERVENTIONS:             GOALS OF CARE/ ADVANCE CARE PLANNING:  Goal is for patient to receive rehab treatment. Patient is a DNR. SOCIAL/EMOTIONAL/SPIRITUAL ASSESSMENT/ INTERVENTIONS:  Patient has a diagnosis of Parkinson's Disease. SW- M. Lattie Haw, RN- M. Howard and NP-K. Highfill. Patient was hospitalized from 07/27/21 to 08/06/21 after having a syncope episode in his home. The patient discharged from [facility] to [facility] for in-patient PT, OT and SPT. Patient was found sitting up in his bed awake and alert. He was responsive to simple questions. Patient was observed having a blank stare where he looked off, was non-verbal and his named had to be called several times to redirect him. Staff was consulted and advised that they have not observed patient having any visual hallucinations since his admission.  The speech therapist was consulted and she reported  that patient remains on a regular diet with thin liquids and is having no issues swallowing.   He utilizes weighted utensils to feed himself.  He is eating between 50-100% of most meals. He remains dependent with bathing, dressing, toileting, transfers and ambulation. In PT, he is being ambulated with his Mainor, but his legs will give out suddenly at times due to weakness. He recently tried to transfer to his wheelchair this week, and slid to the floor. Staff report that patient has had some falls since he has been in rehab where he sustained a few scrapes and bruising on his legs. Palliative care team to provide ongoing support and visits to patient.  PATIENT/CAREGIVER  EDUCATION/ COPING:  Patient appears to be coping well.  PERSONAL EMERGENCY PLAN:  Per facility protocol.  COMMUNITY RESOURCES COORDINATION/ HEALTH CARE NAVIGATION:  Patient is currently receive in-patient rehabilitation. FINANCIAL/LEGAL CONCERNS/INTERVENTIONS:  None.      SOCIAL HX:  Social History   Tobacco Use   Smoking status: Never   Smokeless tobacco: Never  Substance Use Topics   Alcohol use: Yes    Comment: approx 3 glasses wine per week    CODE STATUS: DNR ADVANCED DIRECTIVES: Yes MOST FORM COMPLETE:  Yes HOSPICE EDUCATION PROVIDED: No  PPS: Patient is alert and oriented to self. Patient is receiving in-patient rehabilitation.  Duration of visit and documentation: 45 minutes.  [address] [place], [place]

## 2021-09-05 DIAGNOSIS — N400 Enlarged prostate without lower urinary tract symptoms: Secondary | ICD-10-CM | POA: Diagnosis not present

## 2021-09-05 DIAGNOSIS — I4729 Other ventricular tachycardia: Secondary | ICD-10-CM | POA: Diagnosis not present

## 2021-09-05 DIAGNOSIS — N3281 Overactive bladder: Secondary | ICD-10-CM | POA: Diagnosis not present

## 2021-09-05 DIAGNOSIS — D72829 Elevated white blood cell count, unspecified: Secondary | ICD-10-CM | POA: Diagnosis not present

## 2021-09-05 DIAGNOSIS — F0280 Dementia in other diseases classified elsewhere without behavioral disturbance: Secondary | ICD-10-CM | POA: Diagnosis not present

## 2021-09-05 DIAGNOSIS — G20 Parkinson's disease: Secondary | ICD-10-CM | POA: Diagnosis not present

## 2021-09-05 DIAGNOSIS — R269 Unspecified abnormalities of gait and mobility: Secondary | ICD-10-CM | POA: Diagnosis not present

## 2021-09-05 DIAGNOSIS — N3946 Mixed incontinence: Secondary | ICD-10-CM | POA: Diagnosis not present

## 2021-09-05 DIAGNOSIS — R55 Syncope and collapse: Secondary | ICD-10-CM | POA: Diagnosis not present

## 2021-09-05 DIAGNOSIS — K5900 Constipation, unspecified: Secondary | ICD-10-CM | POA: Diagnosis not present

## 2021-09-06 DIAGNOSIS — F02A18 Dementia in other diseases classified elsewhere, mild, with other behavioral disturbance: Secondary | ICD-10-CM | POA: Diagnosis not present

## 2021-09-06 DIAGNOSIS — N39498 Other specified urinary incontinence: Secondary | ICD-10-CM | POA: Diagnosis not present

## 2021-09-06 DIAGNOSIS — R1310 Dysphagia, unspecified: Secondary | ICD-10-CM | POA: Diagnosis not present

## 2021-09-06 DIAGNOSIS — I472 Ventricular tachycardia, unspecified: Secondary | ICD-10-CM | POA: Diagnosis not present

## 2021-09-06 DIAGNOSIS — G47419 Narcolepsy without cataplexy: Secondary | ICD-10-CM | POA: Diagnosis not present

## 2021-09-06 DIAGNOSIS — Z9181 History of falling: Secondary | ICD-10-CM | POA: Diagnosis not present

## 2021-09-06 DIAGNOSIS — N401 Enlarged prostate with lower urinary tract symptoms: Secondary | ICD-10-CM | POA: Diagnosis not present

## 2021-09-06 DIAGNOSIS — G20 Parkinson's disease: Secondary | ICD-10-CM | POA: Diagnosis not present

## 2021-09-06 DIAGNOSIS — E46 Unspecified protein-calorie malnutrition: Secondary | ICD-10-CM | POA: Diagnosis not present

## 2021-09-07 ENCOUNTER — Encounter (HOSPITAL_COMMUNITY): Payer: Self-pay | Admitting: Emergency Medicine

## 2021-09-07 ENCOUNTER — Emergency Department (HOSPITAL_COMMUNITY): Payer: PPO

## 2021-09-07 ENCOUNTER — Other Ambulatory Visit: Payer: Self-pay

## 2021-09-07 ENCOUNTER — Emergency Department (HOSPITAL_COMMUNITY)
Admission: EM | Admit: 2021-09-07 | Discharge: 2021-09-07 | Disposition: A | Discharge status: Home / Self Care | Payer: PPO | Attending: Emergency Medicine | Admitting: Emergency Medicine

## 2021-09-07 DIAGNOSIS — M6250 Muscle wasting and atrophy, not elsewhere classified, unspecified site: Secondary | ICD-10-CM | POA: Insufficient documentation

## 2021-09-07 DIAGNOSIS — R059 Cough, unspecified: Secondary | ICD-10-CM | POA: Diagnosis not present

## 2021-09-07 DIAGNOSIS — Z043 Encounter for examination and observation following other accident: Secondary | ICD-10-CM | POA: Diagnosis not present

## 2021-09-07 DIAGNOSIS — M47816 Spondylosis without myelopathy or radiculopathy, lumbar region: Secondary | ICD-10-CM | POA: Diagnosis not present

## 2021-09-07 DIAGNOSIS — R531 Weakness: Secondary | ICD-10-CM | POA: Insufficient documentation

## 2021-09-07 DIAGNOSIS — G309 Alzheimer's disease, unspecified: Secondary | ICD-10-CM | POA: Insufficient documentation

## 2021-09-07 DIAGNOSIS — R52 Pain, unspecified: Secondary | ICD-10-CM

## 2021-09-07 DIAGNOSIS — S3993XA Unspecified injury of pelvis, initial encounter: Secondary | ICD-10-CM | POA: Diagnosis not present

## 2021-09-07 DIAGNOSIS — F02818 Dementia in other diseases classified elsewhere, unspecified severity, with other behavioral disturbance: Secondary | ICD-10-CM | POA: Diagnosis not present

## 2021-09-07 DIAGNOSIS — Z7401 Bed confinement status: Secondary | ICD-10-CM | POA: Diagnosis not present

## 2021-09-07 DIAGNOSIS — M160 Bilateral primary osteoarthritis of hip: Secondary | ICD-10-CM | POA: Diagnosis not present

## 2021-09-07 DIAGNOSIS — M47812 Spondylosis without myelopathy or radiculopathy, cervical region: Secondary | ICD-10-CM | POA: Diagnosis not present

## 2021-09-07 DIAGNOSIS — I6381 Other cerebral infarction due to occlusion or stenosis of small artery: Secondary | ICD-10-CM | POA: Diagnosis not present

## 2021-09-07 DIAGNOSIS — R410 Disorientation, unspecified: Secondary | ICD-10-CM | POA: Diagnosis not present

## 2021-09-07 DIAGNOSIS — M2578 Osteophyte, vertebrae: Secondary | ICD-10-CM | POA: Diagnosis not present

## 2021-09-07 DIAGNOSIS — M2550 Pain in unspecified joint: Secondary | ICD-10-CM | POA: Diagnosis not present

## 2021-09-07 DIAGNOSIS — W19XXXA Unspecified fall, initial encounter: Secondary | ICD-10-CM | POA: Diagnosis not present

## 2021-09-07 DIAGNOSIS — S73002A Unspecified subluxation of left hip, initial encounter: Secondary | ICD-10-CM | POA: Diagnosis not present

## 2021-09-07 LAB — BASIC METABOLIC PANEL
Anion gap: 6 (ref 5–15)
BUN: 12 mg/dL (ref 8–23)
CO2: 25 mmol/L (ref 22–32)
Calcium: 8.4 mg/dL — ABNORMAL LOW (ref 8.9–10.3)
Chloride: 108 mmol/L (ref 98–111)
Creatinine, Ser: 0.75 mg/dL (ref 0.61–1.24)
GFR, Estimated: >60 mL/min (ref >60)
Glucose, Bld: 96 mg/dL (ref 70–99)
Potassium: 5.3 mmol/L — ABNORMAL HIGH (ref 3.5–5.1)
Sodium: 139 mmol/L (ref 135–145)

## 2021-09-07 LAB — I-STAT BETA HCG BLOOD, ED (MC, WL, AP ONLY): I-stat hCG, quantitative: <5 m[IU]/mL (ref <5)

## 2021-09-07 MED ORDER — SODIUM CHLORIDE 0.9 % IV BOLUS: 1000.0000 mL | Freq: Once | INTRAVENOUS | Status: DC

## 2021-09-07 NOTE — Discharge Instructions (Signed)
As discussed, your evaluation today has been largely reassuring.  But, it is important that you monitor your condition carefully, and do not hesitate to return to the ED if you develop new, or concerning changes in your condition. ? ?Otherwise, please follow-up with your physician for appropriate ongoing care. ? ?

## 2021-09-07 NOTE — ED Notes (Signed)
[facility] called and transport back to [facility] arranged.

## 2021-09-07 NOTE — ED Provider Notes (Signed)
[facility] DEPT Provider Note   CSN: [id number] Arrival date & time: 09/07/21  1114     History  Chief Complaint  Patient presents with   Adam Cordova is a 66 y.o. male.  HPI Patient with Alzheimer's presents from nursing facility after possible fall.  The patient cannot provide any details, level 5 caveat.  History is obtained by EMS providers, the patient denies everything including pain, weakness, but also is disoriented, only knowing that he is roughly in this geographic area. Per EMS the patient was found on the ground by nursing home staff.  No gross evidence for trauma, the patient was awake and alert throughout transport, hemodynamically unremarkable.    Home Medications Prior to Admission medications   Medication Sig Start Date End Date Taking? Authorizing Provider  carbidopa-levodopa (SINEMET CR) 50-200 MG tablet TAKE 1 TABLET BY MOUTH EVERYDAY AT BEDTIME 08/15/21   Tat, Rebecca S, DO  carbidopa-levodopa (SINEMET IR) 25-100 MG tablet 3 in the AM, 2 at 10am, 2 at 2pm, 2 at 6pm Patient taking differently: Take 2-3 tablets by mouth See admin instructions. Takes 3 tablets at 7am, 2 tablets at 10am, 2.5 tablets at 2pm, and 2.5 tablets at 6pm. 02/05/21   Tat, Eustace Quail, DO  donepezil (ARICEPT) 10 MG tablet TAKE 1 TABLET BY MOUTH EVERYDAY AT BEDTIME 08/15/21   Tat, Eustace Quail, DO  EPINEPHrine 0.3 mg/0.3 mL IJ SOAJ injection Inject 0.3 mg into the muscle as needed for anaphylaxis.    [provider]  MYRBETRIQ 25 MG TB24 tablet Take 25 mg by mouth daily. 12/24/19   [provider]  polyethylene glycol (MIRALAX / GLYCOLAX) 17 g packet Take 17 g by mouth 2 (two) times daily. 08/06/21   Hosie Poisson, MD  QUEtiapine (SEROQUEL) 25 MG tablet 2 BY MOUTH TWICE DAILY AS DIRECTED 08/15/21   Tat, Eustace Quail, DO  senna-docusate (SENOKOT-S) 8.6-50 MG tablet Take 1 tablet by mouth 2 (two) times daily. 08/06/21   Hosie Poisson, MD  Tamsulosin  HCl (FLOMAX) 0.4 MG CAPS Take 0.4 mg by mouth 2 (two) times daily.     [provider]  vitamin B-12 (CYANOCOBALAMIN) 1000 MCG tablet Take 1,000 mcg by mouth daily.    [provider]      Allergies    Bee venom, Levaquin [levofloxacin hemihydrate], and Sertraline    Review of Systems   Review of Systems  Unable to perform ROS: Dementia   Physical Exam Updated Vital Signs BP (!) 192/109    Pulse 65    Temp 98.3 F (36.8 C) (Oral)    Resp 14    SpO2 97%  Physical Exam Vitals and nursing note reviewed.  Constitutional:      General: He is not in acute distress.    Appearance: He is well-developed. He is not ill-appearing or diaphoretic.     Comments: Clear appearing adult male withdrawn, does respond to verbal stimuli, intermittently follows commands.  HENT:     Head: Normocephalic and atraumatic.  Eyes:     Conjunctiva/sclera: Conjunctivae normal.  Cardiovascular:     Rate and Rhythm: Normal rate and regular rhythm.  Pulmonary:     Effort: Pulmonary effort is normal. No respiratory distress.     Breath sounds: No stridor.  Abdominal:     General: There is no distension.  Musculoskeletal:     Cervical back: Neck supple. No rigidity.  Skin:    General: Skin is warm and  dry.  Neurological:     Motor: Weakness and atrophy present.     Comments: Patient awake, interactive when spoken to otherwise withdrawn.  No gross facial abnormalities, speech when offered is brief, clear  Psychiatric:        Cognition and Memory: Cognition is impaired. Memory is impaired.    ED Results / Procedures / Treatments   Labs (all labs ordered are listed, but only abnormal results are displayed) Labs Reviewed  BASIC METABOLIC PANEL - Abnormal; Notable for the following components:      Result Value   Potassium 5.3 (*)    Calcium 8.4 (*)    All other components within normal limits  I-STAT CHEM 8, ED  I-STAT BETA HCG BLOOD, ED ([facility], [facility], [facility] ONLY)     EKG None  Radiology DG Pelvis 1-2 Views  Result Date: 09/07/2021 CLINICAL DATA:  [place] EXAM: PELVIS - 1-2 VIEW COMPARISON:  None FINDINGS: Osseous mineralization low normal. Degenerative changes of the LEFT hip joint with joint space narrowing, spur formation and slight lateral subluxation of femoral head. Minimal narrowing of RIGHT hip joint. SI joints preserved. No acute fracture, dislocation, or bone destruction. Degenerative disc disease changes L4-L5 and L5-S1. IMPRESSION: Degenerative changes lower lumbar spine as well as BILATERAL hip joints much greater on LEFT. No acute osseous abnormalities. Electronically Signed   By: Lavonia Dana M.D.   On: 09/07/2021 12:34   CT Head Wo Contrast  Result Date: 09/07/2021 CLINICAL DATA:  Poly trauma, blunt. Additional history provided: Fall. EXAM: CT HEAD WITHOUT CONTRAST CT CERVICAL SPINE WITHOUT CONTRAST TECHNIQUE: Multidetector CT imaging of the head and cervical spine was performed following the standard protocol without intravenous contrast. Multiplanar CT image reconstructions of the cervical spine were also generated. COMPARISON:  Prior head CT examinations 07/27/2021 and earlier. Cervical spine MRI 11/02/2019. FINDINGS: CT HEAD FINDINGS Brain: Mild generalized cerebral atrophy. Moderate patchy and ill-defined hypoattenuation within the cerebral white matter, nonspecific but compatible with chronic small vessel ischemic disease. Redemonstrated chronic lacunar infarcts within the bilateral basal ganglia. There is no acute intracranial hemorrhage. No demarcated cortical infarct. No extra-axial fluid collection. No evidence of an intracranial mass. No midline shift. Vascular: No hyperdense vessel.  Atherosclerotic calcifications. Skull: Normal. Negative for fracture or focal lesion. Sinuses/Orbits: Visualized orbits show no acute finding. Trace mucosal thickening within the right maxillary sinus at the imaged levels. Trace mucosal thickening within the  bilateral ethmoid sinuses CT CERVICAL SPINE FINDINGS Alignment: Cervicothoracic levocurvature. Trace C4-C5 grade 1 retrolisthesis. Skull base and vertebrae: The basion-dental and atlanto-dental intervals are maintained.No evidence of acute fracture to the cervical spine. Soft tissues and spinal canal: No prevertebral fluid or swelling. No visible canal hematoma. Disc levels: Cervical spondylosis with multilevel disc space narrowing, disc bulges, endplate spurring, uncovertebral hypertrophy and facet arthrosis. Disc space narrowing is greatest at C4-C5, C5-C6 and C6-C7 (advanced at these levels). No appreciable high-grade spinal canal stenosis. Multilevel bony neural foraminal narrowing. Multilevel ventral osteophytes. Upper chest: No consolidation within the imaged lung apices. No visible pneumothorax. IMPRESSION: CT head: 1. No evidence of acute intracranial abnormality. 2. Parenchymal atrophy, chronic small vessel ischemic disease and chronic lacunar infarcts as described. 3. Minimal paranasal sinus disease. CT cervical spine: 1. No evidence of acute fracture to the cervical spine. 2. Cervicothoracic levocurvature. 3. Trace C4-C5 grade 1 retrolisthesis. 4. Cervical spondylosis, as described. Electronically Signed   By: Kellie Simmering D.O.   On: 09/07/2021 12:31   CT Cervical Spine Wo Contrast  Result  Date: 09/07/2021 CLINICAL DATA:  Poly trauma, blunt. Additional history provided: Fall. EXAM: CT HEAD WITHOUT CONTRAST CT CERVICAL SPINE WITHOUT CONTRAST TECHNIQUE: Multidetector CT imaging of the head and cervical spine was performed following the standard protocol without intravenous contrast. Multiplanar CT image reconstructions of the cervical spine were also generated. COMPARISON:  Prior head CT examinations 07/27/2021 and earlier. Cervical spine MRI 11/02/2019. FINDINGS: CT HEAD FINDINGS Brain: Mild generalized cerebral atrophy. Moderate patchy and ill-defined hypoattenuation within the cerebral white matter,  nonspecific but compatible with chronic small vessel ischemic disease. Redemonstrated chronic lacunar infarcts within the bilateral basal ganglia. There is no acute intracranial hemorrhage. No demarcated cortical infarct. No extra-axial fluid collection. No evidence of an intracranial mass. No midline shift. Vascular: No hyperdense vessel.  Atherosclerotic calcifications. Skull: Normal. Negative for fracture or focal lesion. Sinuses/Orbits: Visualized orbits show no acute finding. Trace mucosal thickening within the right maxillary sinus at the imaged levels. Trace mucosal thickening within the bilateral ethmoid sinuses CT CERVICAL SPINE FINDINGS Alignment: Cervicothoracic levocurvature. Trace C4-C5 grade 1 retrolisthesis. Skull base and vertebrae: The basion-dental and atlanto-dental intervals are maintained.No evidence of acute fracture to the cervical spine. Soft tissues and spinal canal: No prevertebral fluid or swelling. No visible canal hematoma. Disc levels: Cervical spondylosis with multilevel disc space narrowing, disc bulges, endplate spurring, uncovertebral hypertrophy and facet arthrosis. Disc space narrowing is greatest at C4-C5, C5-C6 and C6-C7 (advanced at these levels). No appreciable high-grade spinal canal stenosis. Multilevel bony neural foraminal narrowing. Multilevel ventral osteophytes. Upper chest: No consolidation within the imaged lung apices. No visible pneumothorax. IMPRESSION: CT head: 1. No evidence of acute intracranial abnormality. 2. Parenchymal atrophy, chronic small vessel ischemic disease and chronic lacunar infarcts as described. 3. Minimal paranasal sinus disease. CT cervical spine: 1. No evidence of acute fracture to the cervical spine. 2. Cervicothoracic levocurvature. 3. Trace C4-C5 grade 1 retrolisthesis. 4. Cervical spondylosis, as described. Electronically Signed   By: Kellie Simmering D.O.   On: 09/07/2021 12:31   DG Chest Port 1 View  Result Date: 09/07/2021 CLINICAL DATA:   cough EXAM: PORTABLE CHEST 1 VIEW COMPARISON:  November 2022 FINDINGS: The cardiomediastinal silhouette is within normal limits. No pleural effusion. No pneumothorax. No mass or consolidation. No acute osseous abnormality. IMPRESSION: No acute findings in the chest.  No pneumonia. Electronically Signed   By: Albin Felling M.D.   On: 09/07/2021 13:43    Procedures Procedures    Medications Ordered in ED Medications - No data to display  ED Course/ Medical Decision Making/ A&P                           Medical Decision Making Adult male with dementia presents after possible fall from nursing facility.  Differential including illness versus posttraumatic effects including intracranial injury, cervical spine injury, pelvis injury all considered.  Patient had x-rays, labs ordered after initial evaluation.   Patient joined by his wife after initial evaluation.  She states that she has some concern about mild cough, possible sore throat.  On repeat exam throat is clear, no exudate, no erythema.  We discussed all findings and I have independently reviewed and interpreted the CT scans and x-rays as well as the patient's labs.  Labs most notable for mild hyperkalemia, otherwise reassuring.  Patient has hypertension, has not taken his medication today.  Wife notes the patient has been trying to walk, though he is unable to do so, has had frequent falls.  She has requested to nursing staff that the patient's future transfers be judicious given his DNR status, dementia.  Though he is hypertensive, no evidence for distress, and the patient has not yet taken his medication.  Patient discharged to his nursing facility.          Final Clinical Impression(s) / ED Diagnoses Final diagnoses:  Fall, initial encounter    Rx / DC Orders ED Discharge Orders     None         Carmin Muskrat, MD 09/07/21 1541

## 2021-09-07 NOTE — ED Triage Notes (Signed)
The patient presents from [facility] due to a fall. It is unknown why we got up, but once he got from the couch, he fell. EMS reports no obvious injury.    HX VTach  EMS vitals: 108/72 BP 68 HR

## 2021-09-09 NOTE — Progress Notes (Deleted)
Cardiology Office Note:    Date:  09/09/2021   ID:  KAIGE WHISTLER, DOB 1956-07-16, MRN [id number]  PCP:  [name], MD  Cardiologist:  Donato Heinz, MD  Electrophysiologist:  None   Referring MD: [name], MD   No chief complaint on file. ***  History of Present Illness:    Adam Cordova is a 66 y.o. male with a hx of Parkinson's disease, dementia who presents for hospital follow-up.  He was seen at [facility] in November 2022 for syncope.  He had been on the couch and had stood up to walk to the kitchen when he collapsed onto his wife.  She caught him and put him on a stool.  Reports he was unconscious for a couple of minutes.  Event was unusual as patient normally is not able to get up by himself as he requires assistance.  No seizure-like activity was noted.  Episode was suspected to be orthostasis in setting of Parkinson's disease.  Echocardiogram 07/28/2021 showed normal biventricular function, grade 1 diastolic dysfunction, no significant valvular disease.  Cardiac monitor was ordered on discharge.  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 Wells Guiles Tat   Parkinson's disease dementia [facility])    UTI (urinary tract infection)     Past Surgical History:  Procedure Laterality Date   TRANSURETHRAL RESECTION OF PROSTATE N/A 11/23/2012   Procedure: TRANSURETHRAL RESECTION OF THE PROSTATE WITH GYRUS INSTRUMENTS;  Surgeon: Bernestine Amass, MD;  Location: [facility];  Service: Urology;  Laterality: N/A;   TRANSURETHRAL RESECTION OF PROSTATE N/A 07/09/2017   Procedure: TRANSURETHRAL RESECTION OF THE PROSTATE (TURP), BIPOLAR;  Surgeon: Ceasar Mons, MD;  Location: [facility];  Service: Urology;  Laterality: N/A;    Current Medications: No  outpatient medications have been marked as taking for the 09/13/21 encounter (Appointment) with Donato Heinz, MD.     Allergies:   Bee venom, Levaquin [levofloxacin hemihydrate], and Sertraline   Social History   Socioeconomic History   Marital status: Married    Spouse name: Not on file   Number of children: 2   Years of education: Not on file   Highest education level: Not on file  Occupational History   Occupation: retired    Comment: Visual merchandiser  Tobacco Use   Smoking status: Never   Smokeless tobacco: Never  Vaping Use   Vaping Use: Never used  Substance and Sexual Activity   Alcohol use: Yes    Comment: approx 3 glasses wine per week   Drug use: No   Sexual activity: Not on file  Other Topics Concern   Not on file  Social History Narrative   Right handed   Lives in a two story home with wife and dog   Drinks Caffeine. One cup of coffee in the mornings   Social Determinants of Health   Financial Resource Strain: Not on file  Food Insecurity: Not on file  Transportation Needs: Not on file  Physical Activity: Not on file  Stress: Not on file  Social Connections: Not on file     Family History: [name] ***family history includes Dementia in his mother; Heart disease in his brother and father; Neuropathy in his mother.  ROS:   Please see the history of present illness.    *** All other systems reviewed and are negative.  EKGs/Labs/Other Studies Reviewed:    The following studies were reviewed today: ***  EKG:  EKG is *** ordered today.  The ekg ordered today demonstrates ***  Recent Labs: 07/28/2021: ALT <5 08/01/2021: Magnesium 2.0 08/05/2021: Hemoglobin 13.8; Platelets 305 09/07/2021: BUN 12; Creatinine, Ser 0.75; Potassium 5.3; Sodium 139  Recent Lipid Panel No results found for: CHOL, TRIG, HDL, CHOLHDL, VLDL, LDLCALC, LDLDIRECT  Physical Exam:    VS:  There were no vitals taken for this visit.    Wt Readings from Last 3  Encounters:  08/06/21 154 lb 5.2 oz (70 kg)  12/28/20 170 lb (77.1 kg)  12/15/20 169 lb 3.2 oz (76.7 kg)     GEN: *** Well nourished, well developed in no acute distress HEENT: Normal NECK: No JVD; No carotid bruits LYMPHATICS: No lymphadenopathy CARDIAC: ***RRR, no murmurs, rubs, gallops RESPIRATORY:  Clear to auscultation without rales, wheezing or rhonchi  ABDOMEN: Soft, non-tender, non-distended MUSCULOSKELETAL:  No edema; No deformity  SKIN: Warm and dry NEUROLOGIC:  Alert and oriented x 3 PSYCHIATRIC:  Normal affect   ASSESSMENT:    No diagnosis found. PLAN:    Syncope: Likely orthostatic hypotension in setting of Parkinson's disease.  No structural heart disease on echocardiogram.  Cardiac monitor pending   RTC in ***  Medication Adjustments/Labs and Tests Ordered: Current medicines are reviewed at length with the patient today.  Concerns regarding medicines are outlined above.  No orders of the defined types were placed in this encounter.  No orders of the defined types were placed in this encounter.   There are no Patient Instructions on file for this visit.   Signed, Donato Heinz, MD  09/09/2021 3:31 PM    [facility]

## 2021-09-12 DIAGNOSIS — N400 Enlarged prostate without lower urinary tract symptoms: Secondary | ICD-10-CM | POA: Diagnosis not present

## 2021-09-12 DIAGNOSIS — R269 Unspecified abnormalities of gait and mobility: Secondary | ICD-10-CM | POA: Diagnosis not present

## 2021-09-12 DIAGNOSIS — K5900 Constipation, unspecified: Secondary | ICD-10-CM | POA: Diagnosis not present

## 2021-09-12 DIAGNOSIS — N3281 Overactive bladder: Secondary | ICD-10-CM | POA: Diagnosis not present

## 2021-09-12 DIAGNOSIS — R55 Syncope and collapse: Secondary | ICD-10-CM | POA: Diagnosis not present

## 2021-09-12 DIAGNOSIS — I4729 Other ventricular tachycardia: Secondary | ICD-10-CM | POA: Diagnosis not present

## 2021-09-12 DIAGNOSIS — F0280 Dementia in other diseases classified elsewhere without behavioral disturbance: Secondary | ICD-10-CM | POA: Diagnosis not present

## 2021-09-12 DIAGNOSIS — N3946 Mixed incontinence: Secondary | ICD-10-CM | POA: Diagnosis not present

## 2021-09-12 DIAGNOSIS — G20 Parkinson's disease: Secondary | ICD-10-CM | POA: Diagnosis not present

## 2021-09-12 DIAGNOSIS — R296 Repeated falls: Secondary | ICD-10-CM | POA: Diagnosis not present

## 2021-09-13 ENCOUNTER — Ambulatory Visit: Payer: PPO | Admitting: Cardiology

## 2021-09-13 NOTE — Progress Notes (Signed)
Assessment/Plan:   1.  Parkinsons Disease with PDD (dx 2010)  -He will take carbidopa/levodopa 25/100 IR, 3 in the AM, 2 at 10am, 2.5 at 2pm, 2.5 at 6pm.    -continue carbidopa/levodopa 50/200 CR at bed.    -pt now at SNF  -At wife's request, we reviewed his neuroimaging and pulled up the films.  She wanted to understand nature of small vessel disease and history of locations.  We reviewed those today.     2.  CIDP  -No longer using IVIG.  -Under the care of Dr. Posey Pronto  3.  PDD and depression  -On quetiapine, 50 mg bid.  He is very sleepy but was like that prior to addition of seroquel.  -Patient under palliative care  -wife would like to d/c aricept.  I agree.  4.  Dysphagia  - Modified barium swallow was done on October 06, 2019 for cough with questionable dysphagia.  There was mild oral greater than pharyngeal dysphagia.  Patient felt to be a mild aspiration risk.  Home medication with pure recommended along with out patient speech.   5.  B12 deficiency  -On oral supplementation  6.  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  Patient using Myrbetriq.  7.  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  Will monitor.  8.  Left foot dystonia  -Sent to Dr. Letta Pate, but he did not think that Botox would be helpful.  9.  Discussed continued f/u here or just f/u with PCP at SNF.  Wife would like to maintain care here.  We will do next via VV.  Hard to get him here.   Subjective:   JRUE JARRIEL was seen today in follow up for Parkinsons disease.  My previous records were reviewed prior to todays visit as well as outside records available to me.  Patient has advanced Parkinson's disease with PDD.  Last visit, we added nighttime levodopa to see if that would help with the "flapping tremor" at bedtime.  Has been in the emergency room several times since our last visit.  In the emergency room in September after a fall.   CT brain was negative.  He was admitted to the hospital November 25 to December 5 after a syncopal episode.  He was walking into the kitchen and then just collapsed and was out for about 5 minutes.  There was no shaking of the body.  There is no loss of bladder or bowel control.  There was no tongue biting.  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  Wife did not want to discontinue the Flomax or the donepezil or levodopa.  He had nonsustained V. tach during hospital stay and cardiology recommended event monitor on discharge.  Patient was discharged to a nursing facility in [place].  Patient back in the emergency room on January 6 after a fall.  He was found on the ground by nursing facility.  Patient was evaluated in the emergency room, including CT brain, found to be nonacute, and discharged.  In regards to his hallucinations, we increased his quetiapine last visit to 50 mg twice per day and wife states that this did help but she would like to d/c the aricept to keep as few meds as possible.  He is quite sleepy, but that was baseline and the increase in quetiapine has not really changed the EDS.  Current prescribed movement disorder medications:  Carbidopa/levodopa 25/100 IR, up to 9 tablets/day (3 in the AM, 2 at 10am, 2 at 2pm, 2 at 6pm, extra prn) -changed from the CR to see if we would get better motor control without worsening cognition Carbidopa/levodopa 50/200 CR at nighttime Quetiapine 50 mg twice per day  B12 supplement  PREVIOUS MEDICATIONS:  Sinemet, Mirapex and exelon patch; Provigil; entacapone  ALLERGIES:   Allergies  Allergen Reactions   Bee Venom Anaphylaxis   Levaquin [Levofloxacin Hemihydrate] Anaphylaxis and Swelling   Sertraline Diarrhea and Nausea Only    CURRENT MEDICATIONS:  Outpatient Encounter Medications as of 09/17/2021  Medication Sig   carbidopa-levodopa (SINEMET CR) 50-200 MG tablet TAKE 1 TABLET BY MOUTH EVERYDAY AT  BEDTIME   carbidopa-levodopa (SINEMET IR) 25-100 MG tablet 3 in the AM, 2 at 10am, 2 at 2pm, 2 at 6pm (Patient taking differently: Take 2-3 tablets by mouth See admin instructions. Takes 3 tablets at 7am, 2 tablets at 10am, 2.5 tablets at 2pm, and 2.5 tablets at 6pm.)   donepezil (ARICEPT) 10 MG tablet TAKE 1 TABLET BY MOUTH EVERYDAY AT BEDTIME   EPINEPHrine 0.3 mg/0.3 mL IJ SOAJ injection Inject 0.3 mg into the muscle as needed for anaphylaxis.   MYRBETRIQ 25 MG TB24 tablet Take 25 mg by mouth daily.   polyethylene glycol (MIRALAX / GLYCOLAX) 17 g packet Take 17 g by mouth 2 (two) times daily.   QUEtiapine (SEROQUEL) 25 MG tablet 2 BY MOUTH TWICE DAILY AS DIRECTED   senna-docusate (SENOKOT-S) 8.6-50 MG tablet Take 1 tablet by mouth 2 (two) times daily.   Tamsulosin HCl (FLOMAX) 0.4 MG CAPS Take 0.4 mg by mouth 2 (two) times daily.    vitamin B-12 (CYANOCOBALAMIN) 1000 MCG tablet Take 1,000 mcg by mouth daily.   No facility-administered encounter medications on file as of 09/17/2021.    Objective:   PHYSICAL EXAMINATION:    VITALS:   Vitals:   09/17/21 1018  BP: 116/62  Pulse: 72  SpO2: 97%  Height: 5\' 10"  (1.778 m)      Wt Readings from Last 3 Encounters:  08/06/21 154 lb 5.2 oz (70 kg)  12/28/20 170 lb (77.1 kg)  12/15/20 169 lb 3.2 oz (76.7 kg)     GEN:  The patient appears stated age and is in NAD. HEENT:  Normocephalic.  Has superficial scrape on scalp from where he fell and was examined in emergency department.  Neurological examination:  Orientation: The patient is alert when doing the exam, but otherwise sleeps throughout most of the visit. Cranial nerves: There is good facial symmetry with facial hypomimia. The speech is fluent and clear but hypophonic. Soft palate rises symmetrically and there is no tongue deviation. Hearing is intact to conversational tone. Sensation: Sensation is intact to light touch throughout   Movement examination: Tone: There is normal  tone in the UE/LE Abnormal movements: No tremor noted today. Coordination:  There is mild decremation with RAM's, but more apraxia than decremation. Gait and Station: Did not test this.  Patient was very sleepy. I have reviewed and interpreted the following labs independently    Chemistry      Component Value Date/Time   NA 139 09/07/2021 1302   K 5.3 (H) 09/07/2021 1302   CL 108 09/07/2021 1302   CO2 25 09/07/2021 1302   BUN 12 09/07/2021 1302   CREATININE 0.75 09/07/2021 1302   CREATININE 1.06 12/16/2018 1029      Component Value Date/Time   CALCIUM 8.4 (L) 09/07/2021 1302  ALKPHOS 72 07/28/2021 0900   AST 11 (L) 07/28/2021 0900   ALT <5 07/28/2021 0900   BILITOT 1.5 (H) 07/28/2021 0900       Lab Results  Component Value Date   WBC 7.4 08/05/2021   HGB 13.8 08/05/2021   HCT 43.0 08/05/2021   MCV 82.9 08/05/2021   PLT 305 08/05/2021    Lab Results  Component Value Date   TSH 0.49 07/24/2018     Total time spent on today's visit was 33 minutes, including both face-to-face time and nonface-to-face time.  Time included that spent on review of records (prior notes available to me/labs/imaging if pertinent), discussing treatment and goals, answering patient's questions and coordinating care.  This was my time alone and not the time with the LCSW.  Cc:  [name], MD

## 2021-09-14 DIAGNOSIS — N39498 Other specified urinary incontinence: Secondary | ICD-10-CM | POA: Diagnosis not present

## 2021-09-14 DIAGNOSIS — Z9181 History of falling: Secondary | ICD-10-CM | POA: Diagnosis not present

## 2021-09-14 DIAGNOSIS — N401 Enlarged prostate with lower urinary tract symptoms: Secondary | ICD-10-CM | POA: Diagnosis not present

## 2021-09-14 DIAGNOSIS — G47419 Narcolepsy without cataplexy: Secondary | ICD-10-CM | POA: Diagnosis not present

## 2021-09-14 DIAGNOSIS — E46 Unspecified protein-calorie malnutrition: Secondary | ICD-10-CM | POA: Diagnosis not present

## 2021-09-14 DIAGNOSIS — G20 Parkinson's disease: Secondary | ICD-10-CM | POA: Diagnosis not present

## 2021-09-14 DIAGNOSIS — F02A18 Dementia in other diseases classified elsewhere, mild, with other behavioral disturbance: Secondary | ICD-10-CM | POA: Diagnosis not present

## 2021-09-14 DIAGNOSIS — R1310 Dysphagia, unspecified: Secondary | ICD-10-CM | POA: Diagnosis not present

## 2021-09-14 DIAGNOSIS — I472 Ventricular tachycardia, unspecified: Secondary | ICD-10-CM | POA: Diagnosis not present

## 2021-09-17 ENCOUNTER — Other Ambulatory Visit: Payer: Self-pay

## 2021-09-17 ENCOUNTER — Ambulatory Visit: Payer: PPO | Admitting: Neurology

## 2021-09-17 VITALS — BP 116/62 | HR 72 | Ht 70.0 in

## 2021-09-17 DIAGNOSIS — F02818 Dementia in other diseases classified elsewhere, unspecified severity, with other behavioral disturbance: Secondary | ICD-10-CM

## 2021-09-17 DIAGNOSIS — G20 Parkinson's disease: Secondary | ICD-10-CM

## 2021-09-17 DIAGNOSIS — M2042 Other hammer toe(s) (acquired), left foot: Secondary | ICD-10-CM | POA: Diagnosis not present

## 2021-09-17 DIAGNOSIS — M2011 Hallux valgus (acquired), right foot: Secondary | ICD-10-CM | POA: Diagnosis not present

## 2021-09-17 DIAGNOSIS — I7091 Generalized atherosclerosis: Secondary | ICD-10-CM | POA: Diagnosis not present

## 2021-09-17 DIAGNOSIS — M2041 Other hammer toe(s) (acquired), right foot: Secondary | ICD-10-CM | POA: Diagnosis not present

## 2021-09-17 DIAGNOSIS — I6782 Cerebral ischemia: Secondary | ICD-10-CM | POA: Diagnosis not present

## 2021-09-17 DIAGNOSIS — L603 Nail dystrophy: Secondary | ICD-10-CM | POA: Diagnosis not present

## 2021-09-17 DIAGNOSIS — M2012 Hallux valgus (acquired), left foot: Secondary | ICD-10-CM | POA: Diagnosis not present

## 2021-09-17 NOTE — Patient Instructions (Signed)
DISCONTINUE DONEPEZIL

## 2021-09-18 DIAGNOSIS — E46 Unspecified protein-calorie malnutrition: Secondary | ICD-10-CM | POA: Diagnosis not present

## 2021-09-18 DIAGNOSIS — I472 Ventricular tachycardia, unspecified: Secondary | ICD-10-CM | POA: Diagnosis not present

## 2021-09-18 DIAGNOSIS — R1310 Dysphagia, unspecified: Secondary | ICD-10-CM | POA: Diagnosis not present

## 2021-09-18 DIAGNOSIS — G47419 Narcolepsy without cataplexy: Secondary | ICD-10-CM | POA: Diagnosis not present

## 2021-09-18 DIAGNOSIS — N401 Enlarged prostate with lower urinary tract symptoms: Secondary | ICD-10-CM | POA: Diagnosis not present

## 2021-09-18 DIAGNOSIS — F02A18 Dementia in other diseases classified elsewhere, mild, with other behavioral disturbance: Secondary | ICD-10-CM | POA: Diagnosis not present

## 2021-09-18 DIAGNOSIS — G20 Parkinson's disease: Secondary | ICD-10-CM | POA: Diagnosis not present

## 2021-09-18 DIAGNOSIS — N39498 Other specified urinary incontinence: Secondary | ICD-10-CM | POA: Diagnosis not present

## 2021-09-18 DIAGNOSIS — Z9181 History of falling: Secondary | ICD-10-CM | POA: Diagnosis not present

## 2021-09-19 DIAGNOSIS — I4729 Other ventricular tachycardia: Secondary | ICD-10-CM | POA: Diagnosis not present

## 2021-09-19 DIAGNOSIS — R269 Unspecified abnormalities of gait and mobility: Secondary | ICD-10-CM | POA: Diagnosis not present

## 2021-09-19 DIAGNOSIS — G20 Parkinson's disease: Secondary | ICD-10-CM | POA: Diagnosis not present

## 2021-09-19 DIAGNOSIS — N3281 Overactive bladder: Secondary | ICD-10-CM | POA: Diagnosis not present

## 2021-09-19 DIAGNOSIS — M6281 Muscle weakness (generalized): Secondary | ICD-10-CM | POA: Diagnosis not present

## 2021-09-19 DIAGNOSIS — F0280 Dementia in other diseases classified elsewhere without behavioral disturbance: Secondary | ICD-10-CM | POA: Diagnosis not present

## 2021-09-19 DIAGNOSIS — N3946 Mixed incontinence: Secondary | ICD-10-CM | POA: Diagnosis not present

## 2021-09-19 DIAGNOSIS — R296 Repeated falls: Secondary | ICD-10-CM | POA: Diagnosis not present

## 2021-09-19 DIAGNOSIS — K5900 Constipation, unspecified: Secondary | ICD-10-CM | POA: Diagnosis not present

## 2021-09-19 DIAGNOSIS — N400 Enlarged prostate without lower urinary tract symptoms: Secondary | ICD-10-CM | POA: Diagnosis not present

## 2021-09-19 DIAGNOSIS — R55 Syncope and collapse: Secondary | ICD-10-CM | POA: Diagnosis not present

## 2021-09-20 ENCOUNTER — Other Ambulatory Visit: Payer: Self-pay | Admitting: Neurology

## 2021-09-20 DIAGNOSIS — I4729 Other ventricular tachycardia: Secondary | ICD-10-CM | POA: Diagnosis not present

## 2021-09-20 DIAGNOSIS — G20 Parkinson's disease: Secondary | ICD-10-CM | POA: Diagnosis not present

## 2021-09-20 DIAGNOSIS — E559 Vitamin D deficiency, unspecified: Secondary | ICD-10-CM | POA: Diagnosis not present

## 2021-09-20 DIAGNOSIS — F0280 Dementia in other diseases classified elsewhere without behavioral disturbance: Secondary | ICD-10-CM | POA: Diagnosis not present

## 2021-09-20 DIAGNOSIS — E119 Type 2 diabetes mellitus without complications: Secondary | ICD-10-CM | POA: Diagnosis not present

## 2021-09-20 DIAGNOSIS — D519 Vitamin B12 deficiency anemia, unspecified: Secondary | ICD-10-CM | POA: Diagnosis not present

## 2021-09-20 DIAGNOSIS — E039 Hypothyroidism, unspecified: Secondary | ICD-10-CM | POA: Diagnosis not present

## 2021-09-20 DIAGNOSIS — E785 Hyperlipidemia, unspecified: Secondary | ICD-10-CM | POA: Diagnosis not present

## 2021-09-26 DIAGNOSIS — I4729 Other ventricular tachycardia: Secondary | ICD-10-CM | POA: Diagnosis not present

## 2021-09-26 DIAGNOSIS — N3946 Mixed incontinence: Secondary | ICD-10-CM | POA: Diagnosis not present

## 2021-09-26 DIAGNOSIS — R269 Unspecified abnormalities of gait and mobility: Secondary | ICD-10-CM | POA: Diagnosis not present

## 2021-09-26 DIAGNOSIS — G20 Parkinson's disease: Secondary | ICD-10-CM | POA: Diagnosis not present

## 2021-09-26 DIAGNOSIS — K5900 Constipation, unspecified: Secondary | ICD-10-CM | POA: Diagnosis not present

## 2021-09-26 DIAGNOSIS — F0280 Dementia in other diseases classified elsewhere without behavioral disturbance: Secondary | ICD-10-CM | POA: Diagnosis not present

## 2021-09-26 DIAGNOSIS — R946 Abnormal results of thyroid function studies: Secondary | ICD-10-CM | POA: Diagnosis not present

## 2021-09-26 DIAGNOSIS — R55 Syncope and collapse: Secondary | ICD-10-CM | POA: Diagnosis not present

## 2021-09-26 DIAGNOSIS — D72829 Elevated white blood cell count, unspecified: Secondary | ICD-10-CM | POA: Diagnosis not present

## 2021-09-27 DIAGNOSIS — I4729 Other ventricular tachycardia: Secondary | ICD-10-CM | POA: Diagnosis not present

## 2021-09-27 DIAGNOSIS — R946 Abnormal results of thyroid function studies: Secondary | ICD-10-CM | POA: Diagnosis not present

## 2021-09-27 DIAGNOSIS — F0280 Dementia in other diseases classified elsewhere without behavioral disturbance: Secondary | ICD-10-CM | POA: Diagnosis not present

## 2021-09-27 DIAGNOSIS — G20 Parkinson's disease: Secondary | ICD-10-CM | POA: Diagnosis not present

## 2021-10-03 DIAGNOSIS — N3946 Mixed incontinence: Secondary | ICD-10-CM | POA: Diagnosis not present

## 2021-10-03 DIAGNOSIS — N400 Enlarged prostate without lower urinary tract symptoms: Secondary | ICD-10-CM | POA: Diagnosis not present

## 2021-10-03 DIAGNOSIS — R296 Repeated falls: Secondary | ICD-10-CM | POA: Diagnosis not present

## 2021-10-03 DIAGNOSIS — R269 Unspecified abnormalities of gait and mobility: Secondary | ICD-10-CM | POA: Diagnosis not present

## 2021-10-03 DIAGNOSIS — R82998 Other abnormal findings in urine: Secondary | ICD-10-CM | POA: Diagnosis not present

## 2021-10-03 DIAGNOSIS — M6281 Muscle weakness (generalized): Secondary | ICD-10-CM | POA: Diagnosis not present

## 2021-10-03 DIAGNOSIS — F0280 Dementia in other diseases classified elsewhere without behavioral disturbance: Secondary | ICD-10-CM | POA: Diagnosis not present

## 2021-10-03 DIAGNOSIS — G20 Parkinson's disease: Secondary | ICD-10-CM | POA: Diagnosis not present

## 2021-10-03 DIAGNOSIS — I4729 Other ventricular tachycardia: Secondary | ICD-10-CM | POA: Diagnosis not present

## 2021-10-03 DIAGNOSIS — R55 Syncope and collapse: Secondary | ICD-10-CM | POA: Diagnosis not present

## 2021-10-03 DIAGNOSIS — R946 Abnormal results of thyroid function studies: Secondary | ICD-10-CM | POA: Diagnosis not present

## 2021-10-03 DIAGNOSIS — K5900 Constipation, unspecified: Secondary | ICD-10-CM | POA: Diagnosis not present

## 2021-10-04 DIAGNOSIS — R946 Abnormal results of thyroid function studies: Secondary | ICD-10-CM | POA: Diagnosis not present

## 2021-10-05 DIAGNOSIS — E46 Unspecified protein-calorie malnutrition: Secondary | ICD-10-CM | POA: Diagnosis not present

## 2021-10-05 DIAGNOSIS — N39498 Other specified urinary incontinence: Secondary | ICD-10-CM | POA: Diagnosis not present

## 2021-10-05 DIAGNOSIS — Z9181 History of falling: Secondary | ICD-10-CM | POA: Diagnosis not present

## 2021-10-05 DIAGNOSIS — N401 Enlarged prostate with lower urinary tract symptoms: Secondary | ICD-10-CM | POA: Diagnosis not present

## 2021-10-05 DIAGNOSIS — I472 Ventricular tachycardia, unspecified: Secondary | ICD-10-CM | POA: Diagnosis not present

## 2021-10-05 DIAGNOSIS — G20 Parkinson's disease: Secondary | ICD-10-CM | POA: Diagnosis not present

## 2021-10-05 DIAGNOSIS — G47419 Narcolepsy without cataplexy: Secondary | ICD-10-CM | POA: Diagnosis not present

## 2021-10-05 DIAGNOSIS — F02A18 Dementia in other diseases classified elsewhere, mild, with other behavioral disturbance: Secondary | ICD-10-CM | POA: Diagnosis not present

## 2021-10-05 DIAGNOSIS — R1310 Dysphagia, unspecified: Secondary | ICD-10-CM | POA: Diagnosis not present

## 2021-10-12 DIAGNOSIS — R946 Abnormal results of thyroid function studies: Secondary | ICD-10-CM | POA: Diagnosis not present

## 2021-10-12 DIAGNOSIS — D72829 Elevated white blood cell count, unspecified: Secondary | ICD-10-CM | POA: Diagnosis not present

## 2021-10-12 DIAGNOSIS — N3946 Mixed incontinence: Secondary | ICD-10-CM | POA: Diagnosis not present

## 2021-10-12 DIAGNOSIS — R55 Syncope and collapse: Secondary | ICD-10-CM | POA: Diagnosis not present

## 2021-10-12 DIAGNOSIS — R269 Unspecified abnormalities of gait and mobility: Secondary | ICD-10-CM | POA: Diagnosis not present

## 2021-10-12 DIAGNOSIS — R82998 Other abnormal findings in urine: Secondary | ICD-10-CM | POA: Diagnosis not present

## 2021-10-12 DIAGNOSIS — I4729 Other ventricular tachycardia: Secondary | ICD-10-CM | POA: Diagnosis not present

## 2021-10-12 DIAGNOSIS — K5900 Constipation, unspecified: Secondary | ICD-10-CM | POA: Diagnosis not present

## 2021-10-12 DIAGNOSIS — G20 Parkinson's disease: Secondary | ICD-10-CM | POA: Diagnosis not present

## 2021-10-12 DIAGNOSIS — F0280 Dementia in other diseases classified elsewhere without behavioral disturbance: Secondary | ICD-10-CM | POA: Diagnosis not present

## 2021-10-17 DIAGNOSIS — N400 Enlarged prostate without lower urinary tract symptoms: Secondary | ICD-10-CM | POA: Diagnosis not present

## 2021-10-17 DIAGNOSIS — R296 Repeated falls: Secondary | ICD-10-CM | POA: Diagnosis not present

## 2021-10-17 DIAGNOSIS — F0280 Dementia in other diseases classified elsewhere without behavioral disturbance: Secondary | ICD-10-CM | POA: Diagnosis not present

## 2021-10-17 DIAGNOSIS — G20 Parkinson's disease: Secondary | ICD-10-CM | POA: Diagnosis not present

## 2021-10-17 DIAGNOSIS — R269 Unspecified abnormalities of gait and mobility: Secondary | ICD-10-CM | POA: Diagnosis not present

## 2021-10-17 DIAGNOSIS — D72829 Elevated white blood cell count, unspecified: Secondary | ICD-10-CM | POA: Diagnosis not present

## 2021-10-17 DIAGNOSIS — R55 Syncope and collapse: Secondary | ICD-10-CM | POA: Diagnosis not present

## 2021-10-17 DIAGNOSIS — M6281 Muscle weakness (generalized): Secondary | ICD-10-CM | POA: Diagnosis not present

## 2021-10-17 DIAGNOSIS — K5900 Constipation, unspecified: Secondary | ICD-10-CM | POA: Diagnosis not present

## 2021-10-17 DIAGNOSIS — N3946 Mixed incontinence: Secondary | ICD-10-CM | POA: Diagnosis not present

## 2021-10-18 DIAGNOSIS — R82998 Other abnormal findings in urine: Secondary | ICD-10-CM | POA: Diagnosis not present

## 2021-10-24 DIAGNOSIS — M6281 Muscle weakness (generalized): Secondary | ICD-10-CM | POA: Diagnosis not present

## 2021-10-24 DIAGNOSIS — I4729 Other ventricular tachycardia: Secondary | ICD-10-CM | POA: Diagnosis not present

## 2021-10-24 DIAGNOSIS — N400 Enlarged prostate without lower urinary tract symptoms: Secondary | ICD-10-CM | POA: Diagnosis not present

## 2021-10-24 DIAGNOSIS — G20 Parkinson's disease: Secondary | ICD-10-CM | POA: Diagnosis not present

## 2021-10-24 DIAGNOSIS — R296 Repeated falls: Secondary | ICD-10-CM | POA: Diagnosis not present

## 2021-10-24 DIAGNOSIS — R269 Unspecified abnormalities of gait and mobility: Secondary | ICD-10-CM | POA: Diagnosis not present

## 2021-10-24 DIAGNOSIS — K5900 Constipation, unspecified: Secondary | ICD-10-CM | POA: Diagnosis not present

## 2021-10-24 DIAGNOSIS — F0280 Dementia in other diseases classified elsewhere without behavioral disturbance: Secondary | ICD-10-CM | POA: Diagnosis not present

## 2021-10-24 DIAGNOSIS — N3946 Mixed incontinence: Secondary | ICD-10-CM | POA: Diagnosis not present

## 2021-10-24 DIAGNOSIS — R55 Syncope and collapse: Secondary | ICD-10-CM | POA: Diagnosis not present

## 2021-10-31 DIAGNOSIS — D72829 Elevated white blood cell count, unspecified: Secondary | ICD-10-CM | POA: Diagnosis not present

## 2021-10-31 DIAGNOSIS — G20 Parkinson's disease: Secondary | ICD-10-CM | POA: Diagnosis not present

## 2021-10-31 DIAGNOSIS — R627 Adult failure to thrive: Secondary | ICD-10-CM | POA: Diagnosis not present

## 2021-10-31 DIAGNOSIS — R269 Unspecified abnormalities of gait and mobility: Secondary | ICD-10-CM | POA: Diagnosis not present

## 2021-10-31 DIAGNOSIS — N3946 Mixed incontinence: Secondary | ICD-10-CM | POA: Diagnosis not present

## 2021-10-31 DIAGNOSIS — L89212 Pressure ulcer of right hip, stage 2: Secondary | ICD-10-CM | POA: Diagnosis not present

## 2021-10-31 DIAGNOSIS — R55 Syncope and collapse: Secondary | ICD-10-CM | POA: Diagnosis not present

## 2021-10-31 DIAGNOSIS — N400 Enlarged prostate without lower urinary tract symptoms: Secondary | ICD-10-CM | POA: Diagnosis not present

## 2021-10-31 DIAGNOSIS — K5900 Constipation, unspecified: Secondary | ICD-10-CM | POA: Diagnosis not present

## 2021-10-31 DIAGNOSIS — R296 Repeated falls: Secondary | ICD-10-CM | POA: Diagnosis not present

## 2021-10-31 DIAGNOSIS — M6281 Muscle weakness (generalized): Secondary | ICD-10-CM | POA: Diagnosis not present

## 2021-11-12 ENCOUNTER — Other Ambulatory Visit: Payer: Self-pay | Admitting: Neurology

## 2021-11-12 DIAGNOSIS — F0280 Dementia in other diseases classified elsewhere without behavioral disturbance: Secondary | ICD-10-CM

## 2021-11-12 DIAGNOSIS — R441 Visual hallucinations: Secondary | ICD-10-CM

## 2021-12-01 DEATH — deceased

## 2022-03-20 IMAGING — CT CT CERVICAL SPINE W/O CM
3 of 4 series · 11 of 33 positions shown, 13 images · non-contrast
Comparison: Prior head CT examinations 07/27/2021 and earlier.
Cervical spine MRI 11/02/2019.

CLINICAL DATA: Poly trauma, blunt. Additional history provided:
Fall.

EXAM:
CT HEAD WITHOUT CONTRAST
CT CERVICAL SPINE WITHOUT CONTRAST
TECHNIQUE: Multidetector CT imaging of the head and cervical spine was
performed following the standard protocol without intravenous
contrast. Multiplanar CT image reconstructions of the cervical spine
were also generated.

[Series 6: orthogonal bone · axial · 0.22mm/px · z∈[-279,-177]mm · 3 of 95 slices shown, 4 images]
[im 27/95  soft-tissue]
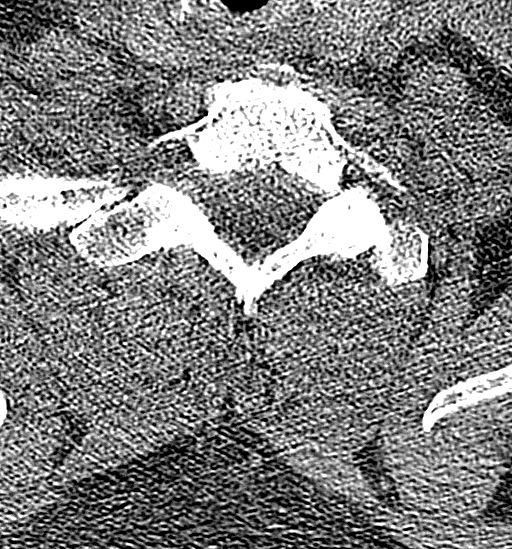
[im 27/95  bone]
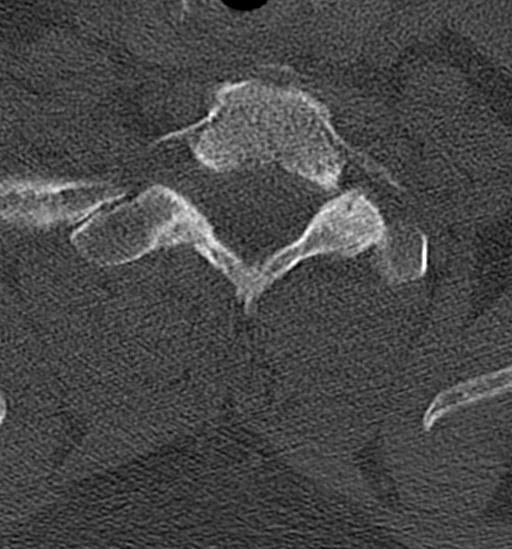
[im 54/95  bone]
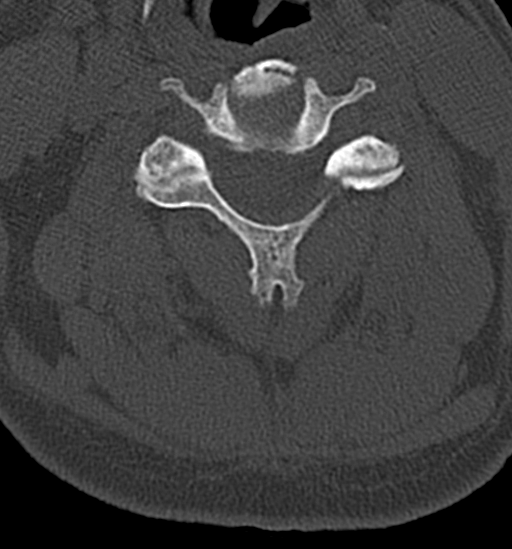
[im 81/95  bone]
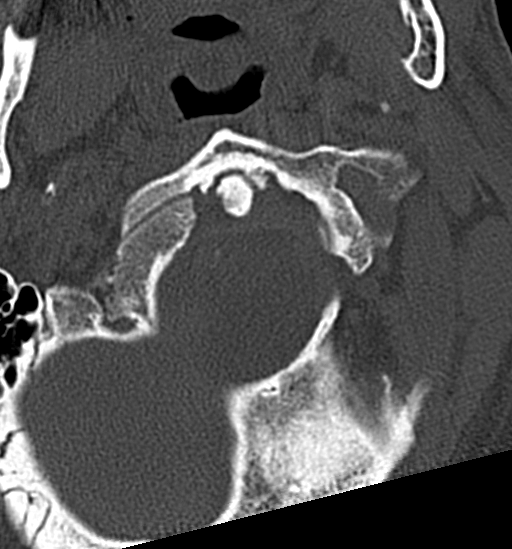

[Series 7: coronal bone · coronal · 0.22mm/px · 3 of 61 slices shown]
[im 13/61  bone]
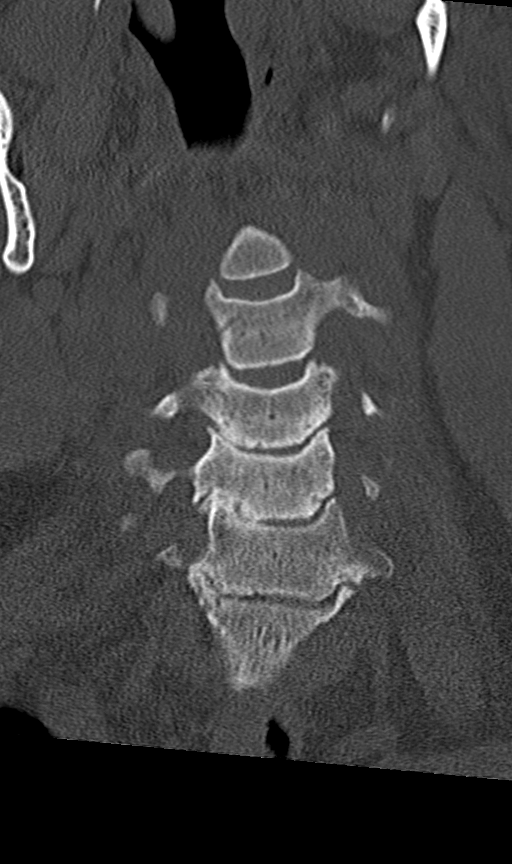
[im 25/61  bone]
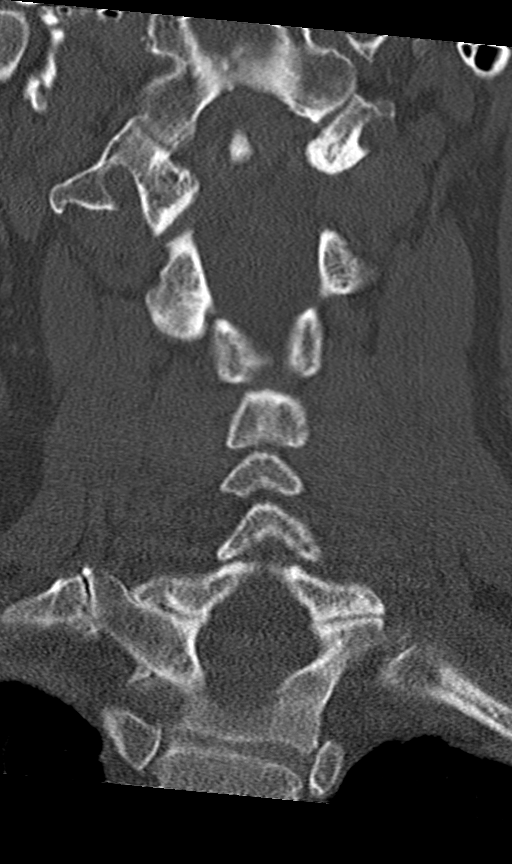
[im 37/61  bone]
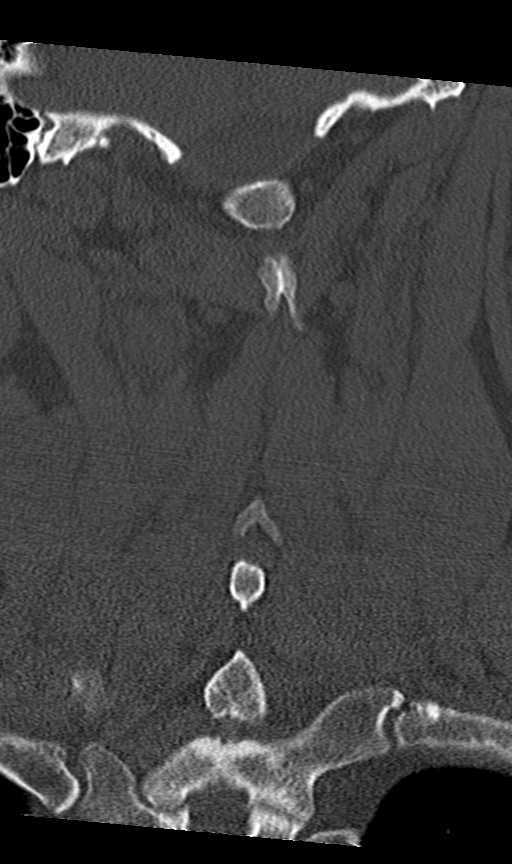

[Series 8: sagittal bone · sagittal · 0.23mm/px · 5 of 57 slices shown, 6 images]
[im 19/57  bone]
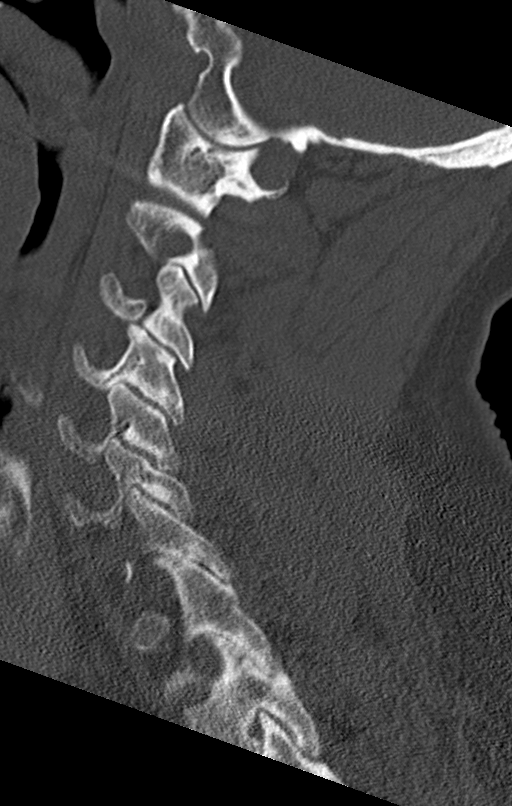
[im 24/57  bone]
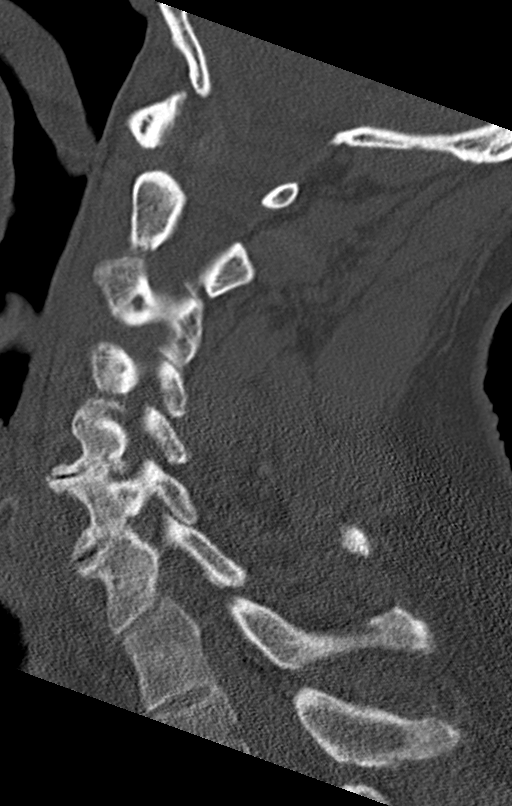
[im 29/57  soft-tissue]
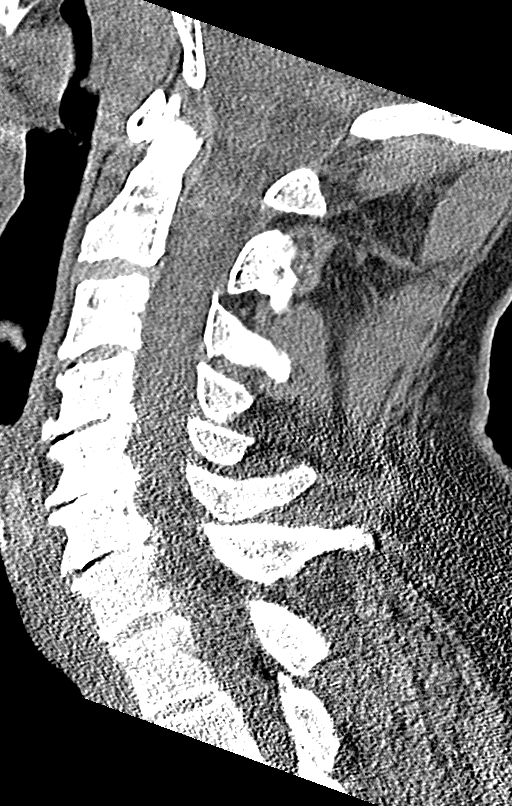
[im 29/57  bone]
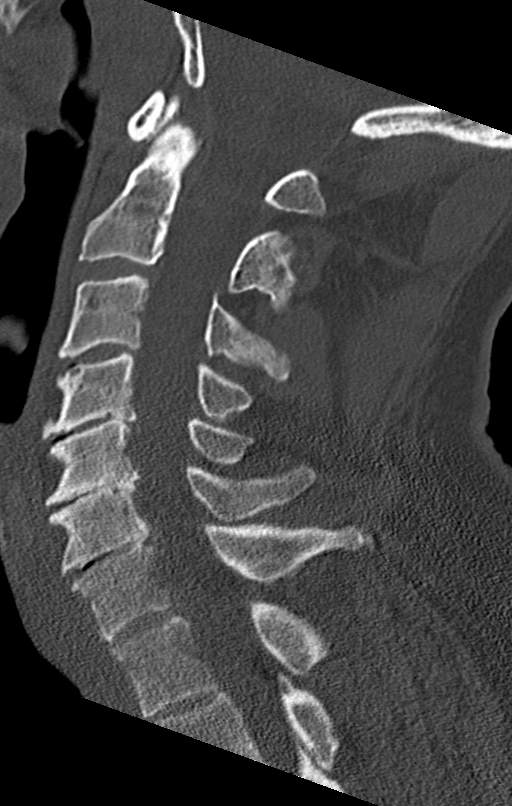
[im 33/57  bone]
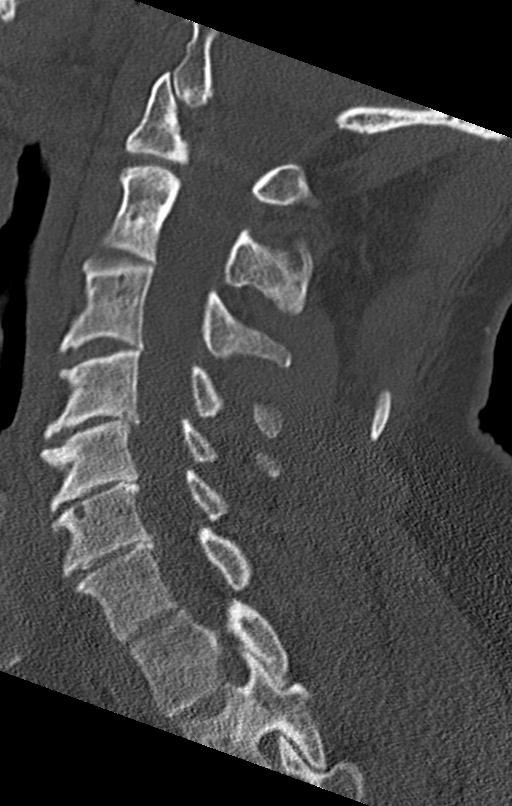
[im 38/57  bone]
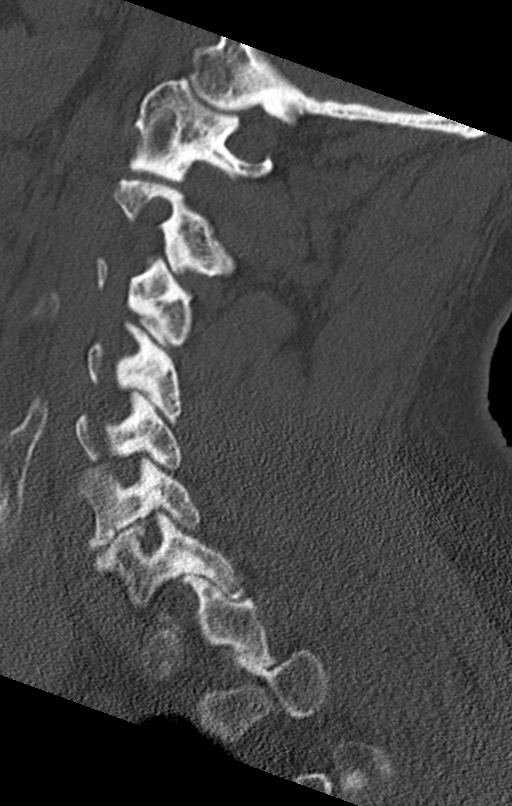

[11 of 33 positions shown; findings below may reference images not displayed]

FINDINGS: CT HEAD FINDINGS

Brain:

Mild generalized cerebral atrophy.

Moderate patchy and ill-defined hypoattenuation within the cerebral
white matter, nonspecific but compatible with chronic small vessel
ischemic disease.

Redemonstrated chronic lacunar infarcts within the bilateral basal
ganglia.

There is no acute intracranial hemorrhage.

No demarcated cortical infarct.

No extra-axial fluid collection.

No evidence of an intracranial mass.

No midline shift.

Vascular: No hyperdense vessel.  Atherosclerotic calcifications.

Skull: Normal. Negative for fracture or focal lesion.

Sinuses/Orbits: Visualized orbits show no acute finding. Trace
mucosal thickening within the right maxillary sinus at the imaged
levels. Trace mucosal thickening within the bilateral ethmoid
sinuses

CT CERVICAL SPINE FINDINGS

Alignment: Cervicothoracic levocurvature. Trace C4-C5 grade 1
retrolisthesis.

Skull base and vertebrae: The basion-dental and atlanto-dental
intervals are maintained.No evidence of acute fracture to the
cervical spine.

Soft tissues and spinal canal: No prevertebral fluid or swelling. No
visible canal hematoma.

Disc levels: Cervical spondylosis with multilevel disc space
narrowing, disc bulges, endplate spurring, uncovertebral hypertrophy
and facet arthrosis. Disc space narrowing is greatest at C4-C5,
C5-C6 and C6-C7 (advanced at these levels). No appreciable
high-grade spinal canal stenosis. Multilevel bony neural foraminal
narrowing. Multilevel ventral osteophytes.

Upper chest: No consolidation within the imaged lung apices. No
visible pneumothorax.
IMPRESSION: CT head:

1. No evidence of acute intracranial abnormality.
2. Parenchymal atrophy, chronic small vessel ischemic disease and
chronic lacunar infarcts as described.
3. Minimal paranasal sinus disease.

CT cervical spine:

1. No evidence of acute fracture to the cervical spine.
2. Cervicothoracic levocurvature.
3. Trace C4-C5 grade 1 retrolisthesis.
4. Cervical spondylosis, as described.

## 2022-03-20 IMAGING — CR DG PELVIS 1-2V
1 series · 1 of 1 positions shown · non-contrast
Comparison: None

CLINICAL DATA: Fell

EXAM:
PELVIS - 1-2 VIEW

[x pelvis]
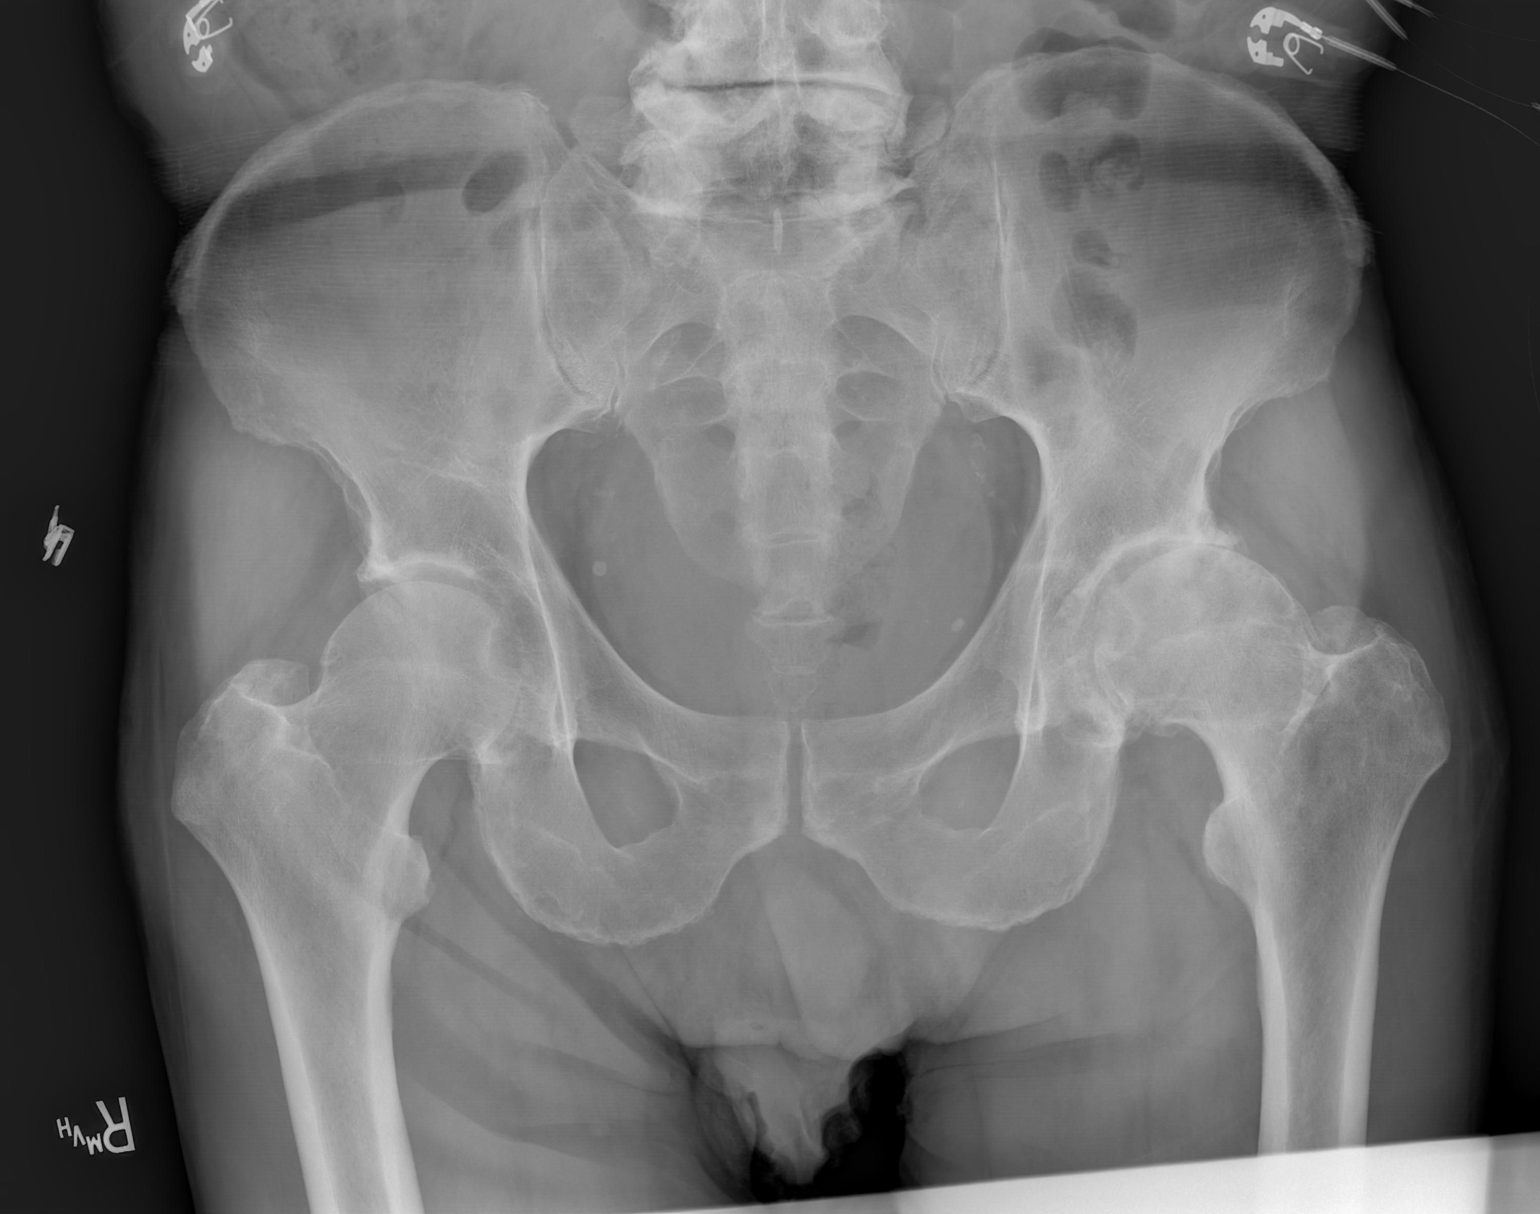

[1 of 1 positions shown; findings below may reference images not displayed]

FINDINGS: Osseous mineralization low normal.

Degenerative changes of the LEFT hip joint with joint space
narrowing, spur formation and slight lateral subluxation of femoral
head.

Minimal narrowing of RIGHT hip joint.

SI joints preserved.

No acute fracture, dislocation, or bone destruction.

Degenerative disc disease changes L4-L5 and L5-S1.
IMPRESSION: Degenerative changes lower lumbar spine as well as BILATERAL hip
joints much greater on LEFT.

No acute osseous abnormalities.

## 2022-03-20 IMAGING — CT CT HEAD W/O CM
3 series · 14 of 47 positions shown, 16 images · non-contrast
Comparison: Prior head CT examinations 07/27/2021 and earlier.
Cervical spine MRI 11/02/2019.

CLINICAL DATA: Poly trauma, blunt. Additional history provided:
Fall.

EXAM:
CT HEAD WITHOUT CONTRAST
CT CERVICAL SPINE WITHOUT CONTRAST
TECHNIQUE: Multidetector CT imaging of the head and cervical spine was
performed following the standard protocol without intravenous
contrast. Multiplanar CT image reconstructions of the cervical spine
were also generated.

[Series 3: head wo · axial · 0.42mm/px · z∈[-128,+12]mm · 8 of 34 slices shown, 10 images]
[im 3/34  brain]
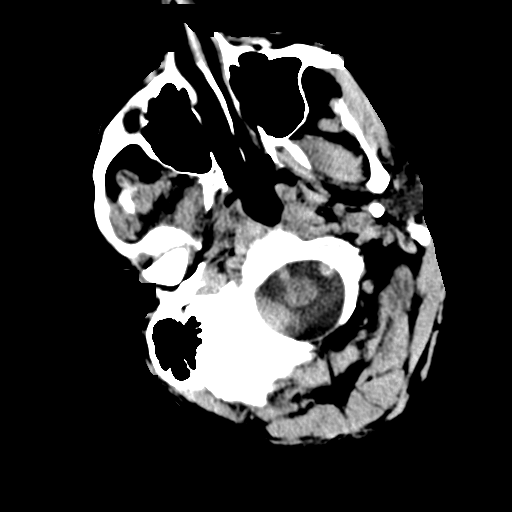
[im 3/34  bone]
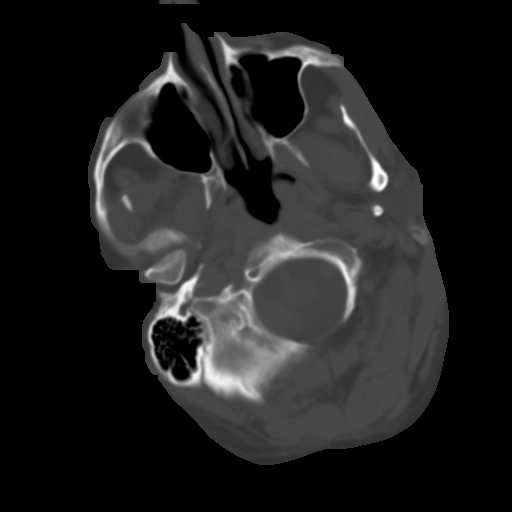
[im 7/34  brain]
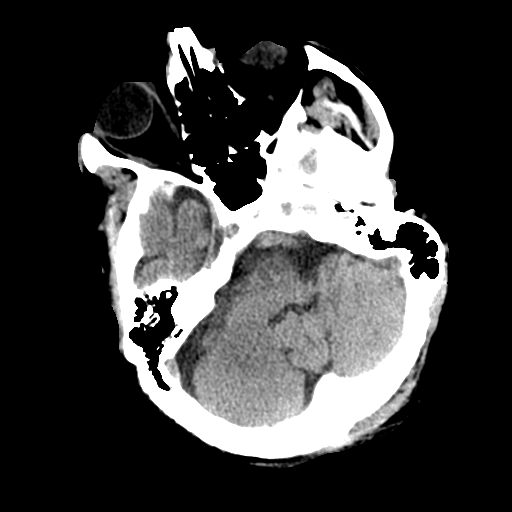
[im 11/34  brain]
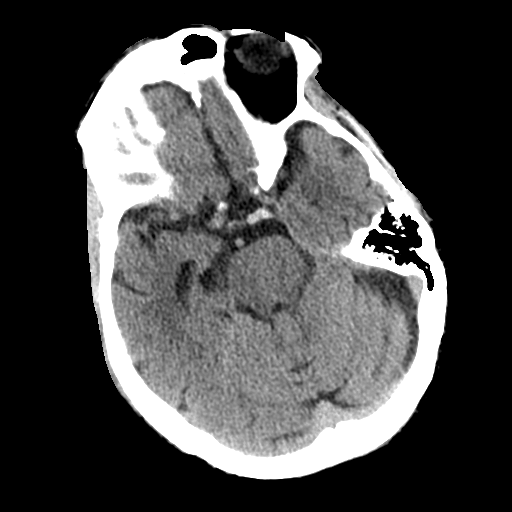
[im 15/34  brain]
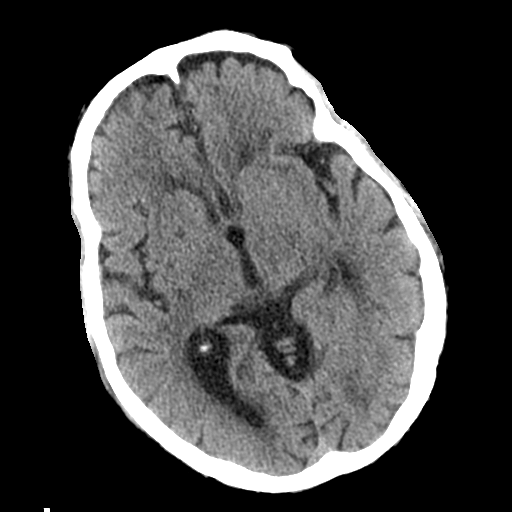
[im 19/34  brain]
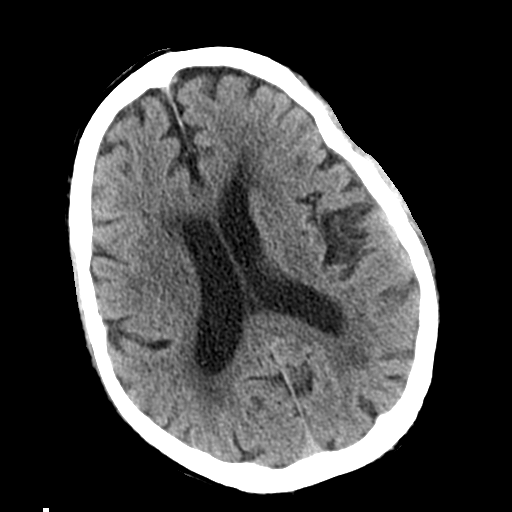
[im 19/34  bone]
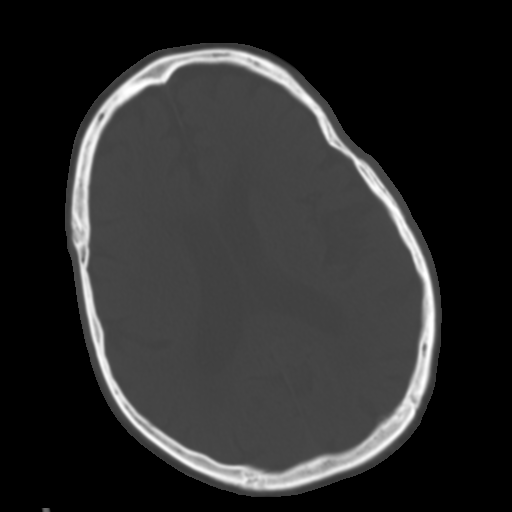
[im 23/34  brain]
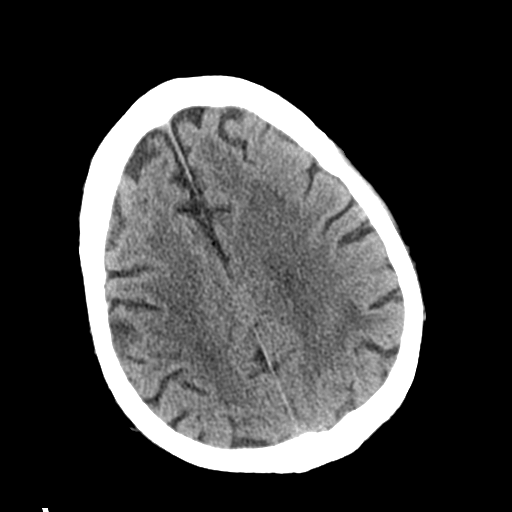
[im 27/34  brain]
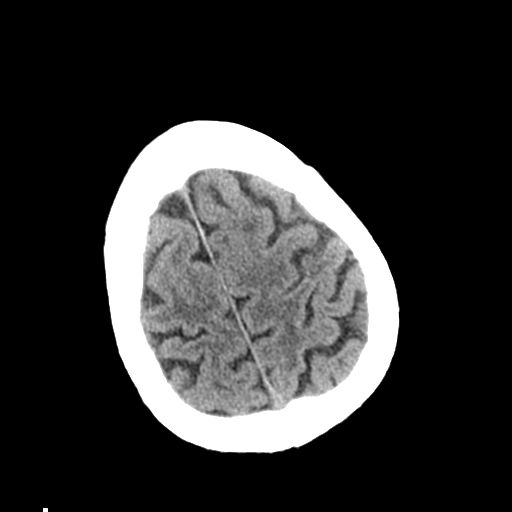
[im 31/34  brain]
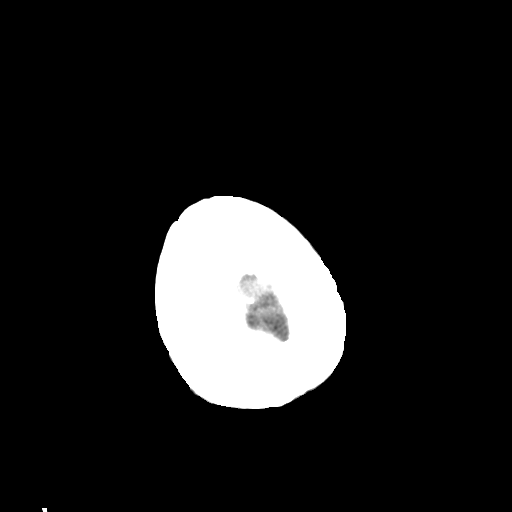

[Series 6: coronal soft tissue · coronal · 0.31mm/px · 3 of 72 slices shown]
[im 25/72  brain]
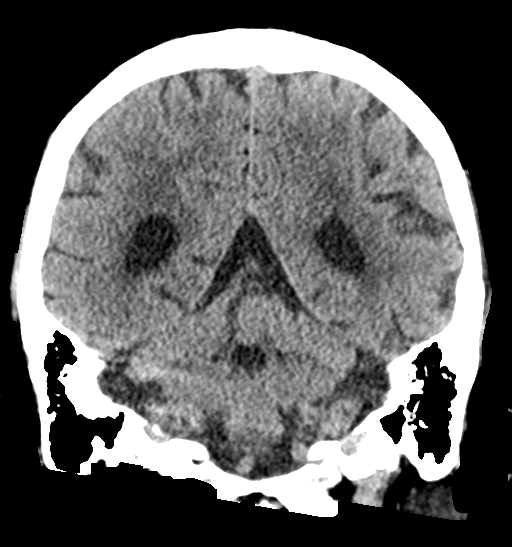
[im 32/72  brain]
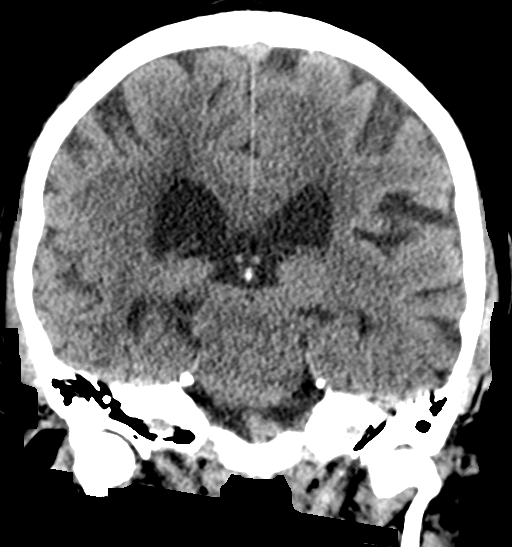
[im 40/72  brain]
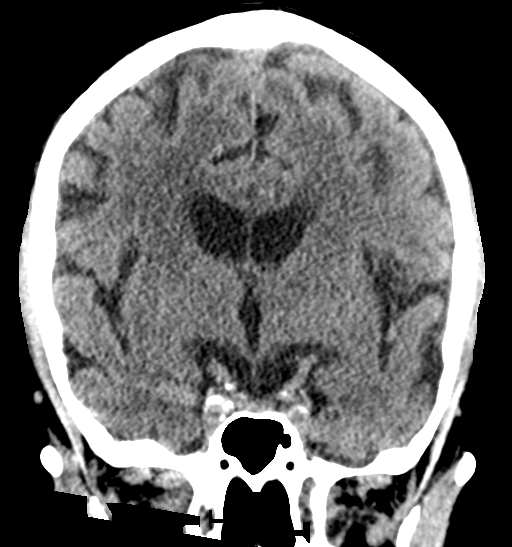

[Series 7: sagittal soft tissue · sagittal · 0.33mm/px · 3 of 54 slices shown]
[im 23/54  brain]
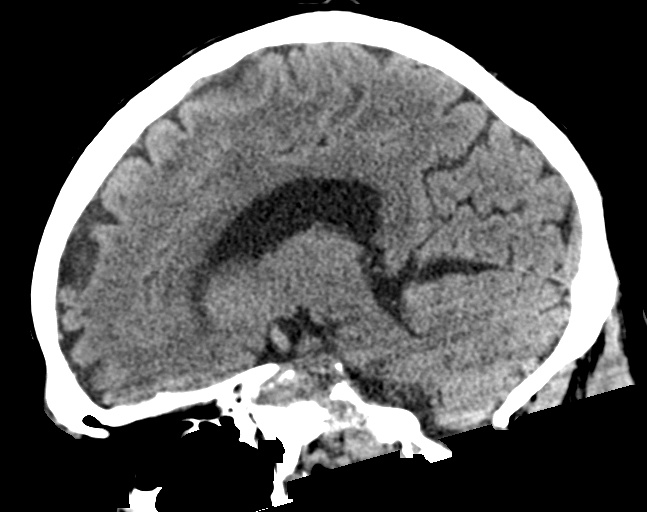
[im 27/54  brain]
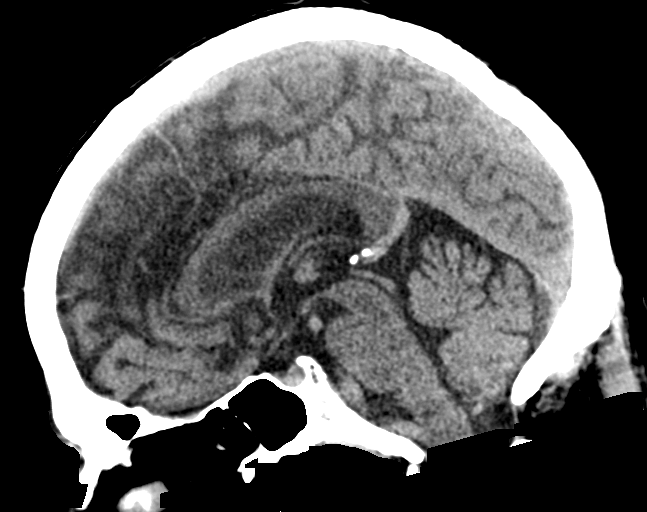
[im 32/54  brain]
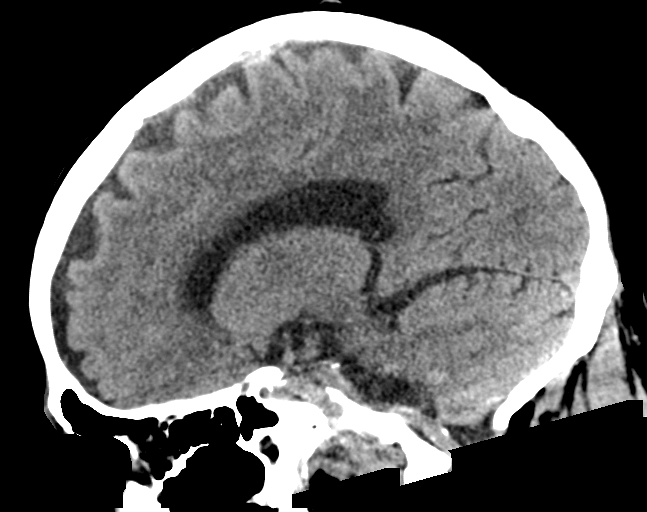

[14 of 47 positions shown; findings below may reference images not displayed]

FINDINGS: CT HEAD FINDINGS

Brain:

Mild generalized cerebral atrophy.

Moderate patchy and ill-defined hypoattenuation within the cerebral
white matter, nonspecific but compatible with chronic small vessel
ischemic disease.

Redemonstrated chronic lacunar infarcts within the bilateral basal
ganglia.

There is no acute intracranial hemorrhage.

No demarcated cortical infarct.

No extra-axial fluid collection.

No evidence of an intracranial mass.

No midline shift.

Vascular: No hyperdense vessel.  Atherosclerotic calcifications.

Skull: Normal. Negative for fracture or focal lesion.

Sinuses/Orbits: Visualized orbits show no acute finding. Trace
mucosal thickening within the right maxillary sinus at the imaged
levels. Trace mucosal thickening within the bilateral ethmoid
sinuses

CT CERVICAL SPINE FINDINGS

Alignment: Cervicothoracic levocurvature. Trace C4-C5 grade 1
retrolisthesis.

Skull base and vertebrae: The basion-dental and atlanto-dental
intervals are maintained.No evidence of acute fracture to the
cervical spine.

Soft tissues and spinal canal: No prevertebral fluid or swelling. No
visible canal hematoma.

Disc levels: Cervical spondylosis with multilevel disc space
narrowing, disc bulges, endplate spurring, uncovertebral hypertrophy
and facet arthrosis. Disc space narrowing is greatest at C4-C5,
C5-C6 and C6-C7 (advanced at these levels). No appreciable
high-grade spinal canal stenosis. Multilevel bony neural foraminal
narrowing. Multilevel ventral osteophytes.

Upper chest: No consolidation within the imaged lung apices. No
visible pneumothorax.
IMPRESSION: CT head:

1. No evidence of acute intracranial abnormality.
2. Parenchymal atrophy, chronic small vessel ischemic disease and
chronic lacunar infarcts as described.
3. Minimal paranasal sinus disease.

CT cervical spine:

1. No evidence of acute fracture to the cervical spine.
2. Cervicothoracic levocurvature.
3. Trace C4-C5 grade 1 retrolisthesis.
4. Cervical spondylosis, as described.

## 2022-05-21 ENCOUNTER — Ambulatory Visit: Payer: PPO | Admitting: Neurology
